# Patient Record
Sex: Male | Born: 1935 | Race: Black or African American | Hispanic: No | Marital: Single | Smoking: Former smoker
Health system: Southern US, Community
[De-identification: ages and names within clinical notes are randomized; demographics above are authoritative.]

## PROBLEM LIST (undated history)

## (undated) ENCOUNTER — Emergency Department (HOSPITAL_COMMUNITY): Disposition: A | Discharge status: Home / Self Care | Payer: Medicare Other

## (undated) DIAGNOSIS — D649 Anemia, unspecified: Secondary | ICD-10-CM

## (undated) DIAGNOSIS — I10 Essential (primary) hypertension: Secondary | ICD-10-CM

## (undated) DIAGNOSIS — F329 Major depressive disorder, single episode, unspecified: Secondary | ICD-10-CM

## (undated) DIAGNOSIS — N400 Enlarged prostate without lower urinary tract symptoms: Secondary | ICD-10-CM

## (undated) DIAGNOSIS — K3184 Gastroparesis: Secondary | ICD-10-CM

## (undated) DIAGNOSIS — F32A Depression, unspecified: Secondary | ICD-10-CM

## (undated) DIAGNOSIS — M1990 Unspecified osteoarthritis, unspecified site: Secondary | ICD-10-CM

## (undated) DIAGNOSIS — M795 Residual foreign body in soft tissue: Secondary | ICD-10-CM

## (undated) DIAGNOSIS — I739 Peripheral vascular disease, unspecified: Secondary | ICD-10-CM

## (undated) DIAGNOSIS — J449 Chronic obstructive pulmonary disease, unspecified: Secondary | ICD-10-CM

## (undated) DIAGNOSIS — F0390 Unspecified dementia without behavioral disturbance: Secondary | ICD-10-CM

## (undated) DIAGNOSIS — I509 Heart failure, unspecified: Secondary | ICD-10-CM

## (undated) DIAGNOSIS — E1140 Type 2 diabetes mellitus with diabetic neuropathy, unspecified: Secondary | ICD-10-CM

## (undated) DIAGNOSIS — E1143 Type 2 diabetes mellitus with diabetic autonomic (poly)neuropathy: Secondary | ICD-10-CM

## (undated) DIAGNOSIS — K219 Gastro-esophageal reflux disease without esophagitis: Secondary | ICD-10-CM

## (undated) DIAGNOSIS — N189 Chronic kidney disease, unspecified: Secondary | ICD-10-CM

## (undated) DIAGNOSIS — E119 Type 2 diabetes mellitus without complications: Secondary | ICD-10-CM

## (undated) DIAGNOSIS — C801 Malignant (primary) neoplasm, unspecified: Secondary | ICD-10-CM

## (undated) DIAGNOSIS — G709 Myoneural disorder, unspecified: Secondary | ICD-10-CM

## (undated) HISTORY — DX: Chronic kidney disease, unspecified: N18.9

## (undated) HISTORY — DX: Essential (primary) hypertension: I10

## (undated) HISTORY — DX: Unspecified dementia, unspecified severity, without behavioral disturbance, psychotic disturbance, mood disturbance, and anxiety: F03.90

## (undated) HISTORY — DX: Type 2 diabetes mellitus without complications: E11.9

## (undated) HISTORY — DX: Residual foreign body in soft tissue: M79.5

## (undated) HISTORY — DX: Type 2 diabetes mellitus with diabetic neuropathy, unspecified: E11.40

## (undated) HISTORY — DX: Malignant (primary) neoplasm, unspecified: C80.1

## (undated) HISTORY — DX: Gastroparesis: K31.84

## (undated) HISTORY — DX: Peripheral vascular disease, unspecified: I73.9

## (undated) HISTORY — PX: SPINE SURGERY: SHX786

## (undated) HISTORY — DX: Benign prostatic hyperplasia without lower urinary tract symptoms: N40.0

## (undated) HISTORY — DX: Chronic obstructive pulmonary disease, unspecified: J44.9

## (undated) HISTORY — DX: Type 2 diabetes mellitus with diabetic autonomic (poly)neuropathy: E11.43

## (undated) HISTORY — PX: OTHER SURGICAL HISTORY: SHX169

## (undated) HISTORY — DX: Anemia, unspecified: D64.9

---

## 2010-04-10 ENCOUNTER — Ambulatory Visit: Payer: Self-pay | Admitting: Internal Medicine

## 2010-04-24 ENCOUNTER — Ambulatory Visit: Payer: Self-pay | Admitting: Internal Medicine

## 2010-05-08 ENCOUNTER — Ambulatory Visit: Payer: Self-pay | Admitting: Internal Medicine

## 2010-05-22 ENCOUNTER — Ambulatory Visit: Payer: Self-pay | Admitting: Internal Medicine

## 2010-05-31 ENCOUNTER — Ambulatory Visit: Payer: Self-pay | Admitting: Internal Medicine

## 2010-06-21 ENCOUNTER — Ambulatory Visit: Payer: Self-pay | Admitting: Internal Medicine

## 2010-06-29 ENCOUNTER — Ambulatory Visit: Payer: Self-pay | Admitting: Internal Medicine

## 2011-05-01 ENCOUNTER — Ambulatory Visit: Payer: Self-pay | Admitting: Emergency Medicine

## 2012-02-13 ENCOUNTER — Ambulatory Visit: Payer: Self-pay | Admitting: Emergency Medicine

## 2012-02-13 LAB — HEPATIC FUNCTION PANEL A (ARMC)
Albumin: 3.6 g/dL (ref 3.4–5.0)
Alkaline Phosphatase: 79 U/L (ref 50–136)
Bilirubin, Direct: 0.1 mg/dL (ref 0.00–0.20)
SGOT(AST): 45 U/L — ABNORMAL HIGH (ref 15–37)
SGPT (ALT): 38 U/L

## 2012-02-20 ENCOUNTER — Inpatient Hospital Stay: Payer: Self-pay | Admitting: Emergency Medicine

## 2012-02-21 LAB — PATHOLOGY REPORT

## 2013-02-27 ENCOUNTER — Encounter (HOSPITAL_BASED_OUTPATIENT_CLINIC_OR_DEPARTMENT_OTHER): Payer: Medicare Other | Attending: General Surgery

## 2013-02-27 DIAGNOSIS — J4489 Other specified chronic obstructive pulmonary disease: Secondary | ICD-10-CM | POA: Insufficient documentation

## 2013-02-27 DIAGNOSIS — L97509 Non-pressure chronic ulcer of other part of unspecified foot with unspecified severity: Secondary | ICD-10-CM | POA: Insufficient documentation

## 2013-02-27 DIAGNOSIS — L89109 Pressure ulcer of unspecified part of back, unspecified stage: Secondary | ICD-10-CM | POA: Insufficient documentation

## 2013-02-27 DIAGNOSIS — E1169 Type 2 diabetes mellitus with other specified complication: Secondary | ICD-10-CM | POA: Insufficient documentation

## 2013-02-27 DIAGNOSIS — J449 Chronic obstructive pulmonary disease, unspecified: Secondary | ICD-10-CM | POA: Insufficient documentation

## 2013-02-27 DIAGNOSIS — F209 Schizophrenia, unspecified: Secondary | ICD-10-CM | POA: Insufficient documentation

## 2013-02-27 DIAGNOSIS — L8992 Pressure ulcer of unspecified site, stage 2: Secondary | ICD-10-CM | POA: Insufficient documentation

## 2013-02-27 NOTE — Progress Notes (Signed)
[facility]  NAME:  Alan Mcintyre, Alan Mcintyre NO.:  [id number]  MEDICAL RECORD NO.:  [id number]      DATE OF BIRTH:  Dec 01, 1934  PHYSICIAN:  Judene Companion, M.D.           VISIT DATE:                                  OFFICE VISIT   This is a 78 year old man, who appears much older and has had many medical problems, including cardiomegaly, schizophrenia, COPD, diabetes, and most impressively according to his doctor, he has had a real breakdown with his schizophrenia and depression.  He was in the hospital for his diabetes and somehow while he was there, he developed a pressure ulcer on his back.  It is about 2.5 cm in diameter.  He also has a diabetic ulcer on the anterior aspect of his right leg.  It is about a cm in diameter.  Both of these have dead skin on the surface and I had to debride this off with sharp dissection, and we are going to treat these with Santyl and dressings and have him come back in a week and while he was here, we did culture these to see if he is going to need an antibiotic.  There is not any evidence of cellulitis and I do feel pulses on both of his legs.  When he was here, his blood pressure is 123/74, respirations 16, pulse 80, temperature 97.7.  He weighs 172 pounds and he is 5 feet 10 inches, so he will be treated with Santyl and return in a week.  DIAGNOSES:  Diabetic ulcer, anterior aspect, right leg, Wagner 3, and then on his back he has stage II pressure ulcer.  Other diagnoses are diabetes, chronic obstructive pulmonary disease, hypertension, schizophrenia, depression.     Judene Companion, M.D.     PP/MEDQ  D:  02/27/2013  T:  02/27/2013  Job:  [id number]

## 2013-03-20 ENCOUNTER — Encounter (HOSPITAL_BASED_OUTPATIENT_CLINIC_OR_DEPARTMENT_OTHER): Payer: Medicare Other | Attending: General Surgery

## 2013-03-20 DIAGNOSIS — L89109 Pressure ulcer of unspecified part of back, unspecified stage: Secondary | ICD-10-CM | POA: Insufficient documentation

## 2013-03-20 DIAGNOSIS — E1169 Type 2 diabetes mellitus with other specified complication: Secondary | ICD-10-CM | POA: Insufficient documentation

## 2013-03-20 DIAGNOSIS — L97809 Non-pressure chronic ulcer of other part of unspecified lower leg with unspecified severity: Secondary | ICD-10-CM | POA: Insufficient documentation

## 2013-03-20 DIAGNOSIS — L8992 Pressure ulcer of unspecified site, stage 2: Secondary | ICD-10-CM | POA: Insufficient documentation

## 2013-04-17 ENCOUNTER — Encounter (HOSPITAL_BASED_OUTPATIENT_CLINIC_OR_DEPARTMENT_OTHER): Payer: Medicare Other | Attending: General Surgery

## 2013-04-17 DIAGNOSIS — E1169 Type 2 diabetes mellitus with other specified complication: Secondary | ICD-10-CM | POA: Insufficient documentation

## 2013-04-17 DIAGNOSIS — L97809 Non-pressure chronic ulcer of other part of unspecified lower leg with unspecified severity: Secondary | ICD-10-CM | POA: Insufficient documentation

## 2013-04-17 DIAGNOSIS — L89309 Pressure ulcer of unspecified buttock, unspecified stage: Secondary | ICD-10-CM | POA: Insufficient documentation

## 2013-04-17 DIAGNOSIS — L8992 Pressure ulcer of unspecified site, stage 2: Secondary | ICD-10-CM | POA: Insufficient documentation

## 2013-04-21 ENCOUNTER — Inpatient Hospital Stay: Payer: Self-pay | Admitting: Surgery

## 2013-04-21 DIAGNOSIS — I369 Nonrheumatic tricuspid valve disorder, unspecified: Secondary | ICD-10-CM

## 2013-04-21 LAB — BASIC METABOLIC PANEL
Anion Gap: 11 (ref 7–16)
BUN: 64 mg/dL — ABNORMAL HIGH (ref 7–18)
Chloride: 111 mmol/L — ABNORMAL HIGH (ref 98–107)
Creatinine: 4.06 mg/dL — ABNORMAL HIGH (ref 0.60–1.30)
EGFR (African American): 16 — ABNORMAL LOW
Glucose: 190 mg/dL — ABNORMAL HIGH (ref 65–99)
Potassium: 4.9 mmol/L (ref 3.5–5.1)

## 2013-04-21 LAB — CBC WITH DIFFERENTIAL/PLATELET
Eosinophil #: 0 10*3/uL (ref 0.0–0.7)
Eosinophil %: 0.2 %
HCT: 37 % — ABNORMAL LOW (ref 40.0–52.0)
HGB: 12.6 g/dL — ABNORMAL LOW (ref 13.0–18.0)
Lymphocyte %: 14.8 %
MCH: 30.1 pg (ref 26.0–34.0)
MCV: 89 fL (ref 80–100)
Monocyte #: 0.8 x10 3/mm (ref 0.2–1.0)
Monocyte %: 19.2 %
Neutrophil #: 2.8 10*3/uL (ref 1.4–6.5)
Platelet: 329 10*3/uL (ref 150–440)
WBC: 4.2 10*3/uL (ref 3.8–10.6)

## 2013-04-21 LAB — COMPREHENSIVE METABOLIC PANEL
Albumin: 3.6 g/dL (ref 3.4–5.0)
Calcium, Total: 8.9 mg/dL (ref 8.5–10.1)
Chloride: 106 mmol/L (ref 98–107)
Co2: 22 mmol/L (ref 21–32)
EGFR (African American): 15 — ABNORMAL LOW
EGFR (Non-African Amer.): 13 — ABNORMAL LOW
Glucose: 268 mg/dL — ABNORMAL HIGH (ref 65–99)
Osmolality: 299 (ref 275–301)
Potassium: 4.4 mmol/L (ref 3.5–5.1)
Sodium: 138 mmol/L (ref 136–145)

## 2013-04-21 LAB — URINALYSIS, COMPLETE
Blood: NEGATIVE
Glucose,UR: NEGATIVE mg/dL (ref 0–75)
Hyaline Cast: 8
Ketone: NEGATIVE
Leukocyte Esterase: NEGATIVE
Ph: 5 (ref 4.5–8.0)
Protein: 30
Specific Gravity: 1.019 (ref 1.003–1.030)
Squamous Epithelial: NONE SEEN
WBC UR: 4 /HPF (ref 0–5)

## 2013-04-21 LAB — MAGNESIUM: Magnesium: 1.5 mg/dL — ABNORMAL LOW

## 2013-04-21 LAB — LIPASE, BLOOD: Lipase: 195 U/L (ref 73–393)

## 2013-04-21 LAB — PHOSPHORUS: Phosphorus: 5 mg/dL — ABNORMAL HIGH (ref 2.5–4.9)

## 2013-04-21 LAB — PROTIME-INR: INR: 1.2

## 2013-04-21 LAB — SODIUM, URINE, RANDOM: Sodium, Urine Random: 6 mmol/L (ref 20–110)

## 2013-04-22 LAB — CBC WITH DIFFERENTIAL/PLATELET
Basophil %: 0.2 %
Eosinophil #: 0 10*3/uL (ref 0.0–0.7)
Eosinophil %: 0.7 %
HCT: 34 % — ABNORMAL LOW (ref 40.0–52.0)
Lymphocyte #: 0.5 10*3/uL — ABNORMAL LOW (ref 1.0–3.6)
MCH: 30.1 pg (ref 26.0–34.0)
MCHC: 34.3 g/dL (ref 32.0–36.0)
MCV: 88 fL (ref 80–100)
Monocyte #: 0.8 x10 3/mm (ref 0.2–1.0)
Neutrophil %: 62.7 %
Platelet: 257 10*3/uL (ref 150–440)
RBC: 3.88 10*6/uL — ABNORMAL LOW (ref 4.40–5.90)
RDW: 17.4 % — ABNORMAL HIGH (ref 11.5–14.5)
WBC: 3.6 10*3/uL — ABNORMAL LOW (ref 3.8–10.6)

## 2013-04-22 LAB — BASIC METABOLIC PANEL
Anion Gap: 10 (ref 7–16)
Chloride: 110 mmol/L — ABNORMAL HIGH (ref 98–107)
Co2: 22 mmol/L (ref 21–32)
EGFR (Non-African Amer.): 15 — ABNORMAL LOW
Osmolality: 311 (ref 275–301)
Potassium: 3.7 mmol/L (ref 3.5–5.1)
Sodium: 142 mmol/L (ref 136–145)

## 2013-04-22 LAB — MAGNESIUM: Magnesium: 2.3 mg/dL

## 2013-04-23 LAB — URINE CULTURE

## 2013-04-25 LAB — CBC WITH DIFFERENTIAL/PLATELET
Basophil %: 0.1 %
Eosinophil #: 0 10*3/uL (ref 0.0–0.7)
Eosinophil %: 0.1 %
HGB: 12.5 g/dL — ABNORMAL LOW (ref 13.0–18.0)
Lymphocyte #: 0.6 10*3/uL — ABNORMAL LOW (ref 1.0–3.6)
MCH: 30.2 pg (ref 26.0–34.0)
MCHC: 34.4 g/dL (ref 32.0–36.0)
Monocyte #: 0.7 x10 3/mm (ref 0.2–1.0)
Monocyte %: 10.5 %
Neutrophil #: 5.1 10*3/uL (ref 1.4–6.5)
Neutrophil %: 79.7 %
Platelet: 253 10*3/uL (ref 150–440)
RBC: 4.12 10*6/uL — ABNORMAL LOW (ref 4.40–5.90)
RDW: 16.2 % — ABNORMAL HIGH (ref 11.5–14.5)
WBC: 6.4 10*3/uL (ref 3.8–10.6)

## 2013-04-25 LAB — BASIC METABOLIC PANEL
Anion Gap: 7 (ref 7–16)
BUN: 32 mg/dL — ABNORMAL HIGH (ref 7–18)
Calcium, Total: 8.4 mg/dL — ABNORMAL LOW (ref 8.5–10.1)
Chloride: 113 mmol/L — ABNORMAL HIGH (ref 98–107)
Co2: 23 mmol/L (ref 21–32)
Creatinine: 1.87 mg/dL — ABNORMAL HIGH (ref 0.60–1.30)
EGFR (African American): 40 — ABNORMAL LOW
Glucose: 161 mg/dL — ABNORMAL HIGH (ref 65–99)
Osmolality: 295 (ref 275–301)
Potassium: 4.4 mmol/L (ref 3.5–5.1)
Sodium: 143 mmol/L (ref 136–145)

## 2013-04-26 LAB — CBC WITH DIFFERENTIAL/PLATELET
Basophil #: 0 10*3/uL (ref 0.0–0.1)
Basophil %: 0.3 %
Eosinophil #: 0 10*3/uL (ref 0.0–0.7)
HGB: 11.5 g/dL — ABNORMAL LOW (ref 13.0–18.0)
Lymphocyte #: 0.6 10*3/uL — ABNORMAL LOW (ref 1.0–3.6)
Lymphocyte %: 7.3 %
MCH: 29.9 pg (ref 26.0–34.0)
MCHC: 34.7 g/dL (ref 32.0–36.0)
MCV: 86 fL (ref 80–100)
Monocyte #: 1 x10 3/mm (ref 0.2–1.0)
Monocyte %: 12.3 %
Neutrophil %: 79.9 %
RDW: 16.4 % — ABNORMAL HIGH (ref 11.5–14.5)
WBC: 8.2 10*3/uL (ref 3.8–10.6)

## 2013-04-26 LAB — BASIC METABOLIC PANEL
BUN: 30 mg/dL — ABNORMAL HIGH (ref 7–18)
Calcium, Total: 9 mg/dL (ref 8.5–10.1)
Chloride: 111 mmol/L — ABNORMAL HIGH (ref 98–107)
Co2: 24 mmol/L (ref 21–32)
EGFR (African American): 45 — ABNORMAL LOW
Glucose: 186 mg/dL — ABNORMAL HIGH (ref 65–99)
Osmolality: 294 (ref 275–301)
Potassium: 4.2 mmol/L (ref 3.5–5.1)

## 2013-04-26 LAB — CULTURE, BLOOD (SINGLE)

## 2013-04-26 LAB — MAGNESIUM: Magnesium: 1.3 mg/dL — ABNORMAL LOW

## 2013-04-27 LAB — CBC WITH DIFFERENTIAL/PLATELET
Basophil #: 0 10*3/uL (ref 0.0–0.1)
Eosinophil #: 0 10*3/uL (ref 0.0–0.7)
HGB: 10 g/dL — ABNORMAL LOW (ref 13.0–18.0)
Lymphocyte #: 0.6 10*3/uL — ABNORMAL LOW (ref 1.0–3.6)
MCHC: 34.9 g/dL (ref 32.0–36.0)
Monocyte #: 0.9 x10 3/mm (ref 0.2–1.0)
Monocyte %: 8.8 %
Neutrophil #: 8.4 10*3/uL — ABNORMAL HIGH (ref 1.4–6.5)
Platelet: 227 10*3/uL (ref 150–440)
RBC: 3.37 10*6/uL — ABNORMAL LOW (ref 4.40–5.90)
RDW: 16.3 % — ABNORMAL HIGH (ref 11.5–14.5)

## 2013-04-27 LAB — BASIC METABOLIC PANEL
Anion Gap: 6 — ABNORMAL LOW (ref 7–16)
BUN: 30 mg/dL — ABNORMAL HIGH (ref 7–18)
Calcium, Total: 9.2 mg/dL (ref 8.5–10.1)
Chloride: 112 mmol/L — ABNORMAL HIGH (ref 98–107)
Co2: 29 mmol/L (ref 21–32)
Creatinine: 1.58 mg/dL — ABNORMAL HIGH (ref 0.60–1.30)
EGFR (Non-African Amer.): 42 — ABNORMAL LOW
Glucose: 171 mg/dL — ABNORMAL HIGH (ref 65–99)
Osmolality: 303 (ref 275–301)
Sodium: 147 mmol/L — ABNORMAL HIGH (ref 136–145)

## 2013-04-27 LAB — URINALYSIS, COMPLETE
Bacteria: NONE SEEN
Bilirubin,UR: NEGATIVE
Blood: NEGATIVE
Ketone: NEGATIVE
Protein: NEGATIVE
RBC,UR: 4 /HPF (ref 0–5)
Specific Gravity: 1.012 (ref 1.003–1.030)

## 2013-04-28 LAB — BASIC METABOLIC PANEL
Anion Gap: 6 — ABNORMAL LOW (ref 7–16)
BUN: 26 mg/dL — ABNORMAL HIGH (ref 7–18)
Calcium, Total: 9 mg/dL (ref 8.5–10.1)
Chloride: 110 mmol/L — ABNORMAL HIGH (ref 98–107)
Co2: 30 mmol/L (ref 21–32)
Creatinine: 1.44 mg/dL — ABNORMAL HIGH (ref 0.60–1.30)
EGFR (African American): 54 — ABNORMAL LOW
EGFR (Non-African Amer.): 47 — ABNORMAL LOW
Glucose: 150 mg/dL — ABNORMAL HIGH (ref 65–99)
Osmolality: 298 (ref 275–301)
Potassium: 3.9 mmol/L (ref 3.5–5.1)
Sodium: 146 mmol/L — ABNORMAL HIGH (ref 136–145)

## 2013-04-28 LAB — PATHOLOGY REPORT

## 2013-04-29 LAB — CBC WITH DIFFERENTIAL/PLATELET
Basophil #: 0 10*3/uL (ref 0.0–0.1)
Basophil %: 0.1 %
Eosinophil #: 0.1 10*3/uL (ref 0.0–0.7)
HCT: 27.7 % — ABNORMAL LOW (ref 40.0–52.0)
Lymphocyte #: 0.5 10*3/uL — ABNORMAL LOW (ref 1.0–3.6)
Lymphocyte %: 4.2 %
MCH: 29.3 pg (ref 26.0–34.0)
MCHC: 33.8 g/dL (ref 32.0–36.0)
MCV: 87 fL (ref 80–100)
Monocyte #: 0.8 x10 3/mm (ref 0.2–1.0)
Monocyte %: 6.4 %
Neutrophil #: 11.1 10*3/uL — ABNORMAL HIGH (ref 1.4–6.5)
Neutrophil %: 88.7 %
Platelet: 220 10*3/uL (ref 150–440)
RDW: 16.7 % — ABNORMAL HIGH (ref 11.5–14.5)
WBC: 12.5 10*3/uL — ABNORMAL HIGH (ref 3.8–10.6)

## 2013-04-29 LAB — BASIC METABOLIC PANEL
Anion Gap: 3 — ABNORMAL LOW (ref 7–16)
BUN: 22 mg/dL — ABNORMAL HIGH (ref 7–18)
Chloride: 101 mmol/L (ref 98–107)
Co2: 31 mmol/L (ref 21–32)
Creatinine: 1.35 mg/dL — ABNORMAL HIGH (ref 0.60–1.30)
EGFR (African American): 59 — ABNORMAL LOW
EGFR (Non-African Amer.): 51 — ABNORMAL LOW
Osmolality: 293 (ref 275–301)

## 2013-04-29 LAB — PHOSPHORUS: Phosphorus: 2.7 mg/dL (ref 2.5–4.9)

## 2013-04-29 LAB — MAGNESIUM: Magnesium: 1.1 mg/dL — ABNORMAL LOW

## 2013-04-30 LAB — CBC WITH DIFFERENTIAL/PLATELET
Eosinophil #: 0.1 10*3/uL (ref 0.0–0.7)
Eosinophil %: 0.8 %
Lymphocyte #: 1.2 10*3/uL (ref 1.0–3.6)
MCV: 84 fL (ref 80–100)
Monocyte #: 1.4 x10 3/mm — ABNORMAL HIGH (ref 0.2–1.0)
Neutrophil #: 13.5 10*3/uL — ABNORMAL HIGH (ref 1.4–6.5)
Neutrophil %: 83.2 %
Platelet: 235 10*3/uL (ref 150–440)
RBC: 3.43 10*6/uL — ABNORMAL LOW (ref 4.40–5.90)
RDW: 16.4 % — ABNORMAL HIGH (ref 11.5–14.5)

## 2013-04-30 LAB — HEMOGLOBIN A1C: Hemoglobin A1C: 5 % (ref 4.2–6.3)

## 2013-05-01 LAB — CREATININE, SERUM
Creatinine: 1.29 mg/dL (ref 0.60–1.30)
EGFR (African American): >60

## 2013-05-01 LAB — SODIUM: Sodium: 144 mmol/L (ref 136–145)

## 2013-05-01 LAB — POTASSIUM: Potassium: 3.6 mmol/L (ref 3.5–5.1)

## 2013-05-01 LAB — VANCOMYCIN, TROUGH: Vancomycin, Trough: 10 ug/mL (ref 10–20)

## 2013-05-01 LAB — CALCIUM: Calcium, Total: 8.5 mg/dL (ref 8.5–10.1)

## 2013-05-01 LAB — MAGNESIUM: Magnesium: 1.5 mg/dL — ABNORMAL LOW

## 2013-05-02 LAB — PHOSPHORUS: Phosphorus: 2.9 mg/dL (ref 2.5–4.9)

## 2013-05-02 LAB — BASIC METABOLIC PANEL
Anion Gap: 3 — ABNORMAL LOW (ref 7–16)
BUN: 18 mg/dL (ref 7–18)
Calcium, Total: 8 mg/dL — ABNORMAL LOW (ref 8.5–10.1)
Co2: 28 mmol/L (ref 21–32)
Creatinine: 1.23 mg/dL (ref 0.60–1.30)
EGFR (Non-African Amer.): 57 — ABNORMAL LOW
Glucose: 143 mg/dL — ABNORMAL HIGH (ref 65–99)
Osmolality: 286 (ref 275–301)
Potassium: 3.7 mmol/L (ref 3.5–5.1)
Sodium: 141 mmol/L (ref 136–145)

## 2013-05-02 LAB — CBC WITH DIFFERENTIAL/PLATELET
Basophil %: 0.5 %
Eosinophil %: 1.3 %
HCT: 27.3 % — ABNORMAL LOW (ref 40.0–52.0)
Lymphocyte #: 1.4 10*3/uL (ref 1.0–3.6)
Lymphocyte %: 8.8 %
MCHC: 35.3 g/dL (ref 32.0–36.0)
MCV: 82 fL (ref 80–100)
Monocyte #: 1.5 x10 3/mm — ABNORMAL HIGH (ref 0.2–1.0)
Neutrophil %: 79.6 %
Platelet: 272 10*3/uL (ref 150–440)
RDW: 17.2 % — ABNORMAL HIGH (ref 11.5–14.5)
WBC: 15.4 10*3/uL — ABNORMAL HIGH (ref 3.8–10.6)

## 2013-05-02 LAB — MAGNESIUM: Magnesium: 1.5 mg/dL — ABNORMAL LOW

## 2013-05-03 LAB — WOUND CULTURE

## 2013-05-04 LAB — CBC WITH DIFFERENTIAL/PLATELET
Eosinophil #: 0 10*3/uL (ref 0.0–0.7)
Eosinophil %: 0.1 %
HGB: 9.4 g/dL — ABNORMAL LOW (ref 13.0–18.0)
Lymphocyte #: 1.5 10*3/uL (ref 1.0–3.6)
Lymphocyte %: 5.7 %
MCH: 28.6 pg (ref 26.0–34.0)
MCHC: 34.8 g/dL (ref 32.0–36.0)
Monocyte #: 2.3 x10 3/mm — ABNORMAL HIGH (ref 0.2–1.0)
Neutrophil #: 23 10*3/uL — ABNORMAL HIGH (ref 1.4–6.5)
Neutrophil %: 85.4 %
RDW: 17.5 % — ABNORMAL HIGH (ref 11.5–14.5)
WBC: 27 10*3/uL — ABNORMAL HIGH (ref 3.8–10.6)

## 2013-05-04 LAB — BASIC METABOLIC PANEL
BUN: 45 mg/dL — ABNORMAL HIGH (ref 7–18)
Calcium, Total: 8.6 mg/dL (ref 8.5–10.1)
Co2: 25 mmol/L (ref 21–32)
Glucose: 143 mg/dL — ABNORMAL HIGH (ref 65–99)
Osmolality: 286 (ref 275–301)
Potassium: 4.1 mmol/L (ref 3.5–5.1)
Sodium: 136 mmol/L (ref 136–145)

## 2013-05-05 LAB — BASIC METABOLIC PANEL
Anion Gap: 6 — ABNORMAL LOW (ref 7–16)
Calcium, Total: 8 mg/dL — ABNORMAL LOW (ref 8.5–10.1)
Creatinine: 2.27 mg/dL — ABNORMAL HIGH (ref 0.60–1.30)
Osmolality: 287 (ref 275–301)
Sodium: 140 mmol/L (ref 136–145)

## 2013-05-06 ENCOUNTER — Ambulatory Visit (HOSPITAL_COMMUNITY)
Admission: AD | Admit: 2013-05-06 | Discharge: 2013-05-06 | Disposition: A | Discharge status: Home / Self Care | Payer: Medicare Other | Source: Other Acute Inpatient Hospital | Attending: Internal Medicine | Admitting: Internal Medicine

## 2013-05-06 ENCOUNTER — Inpatient Hospital Stay
Admission: AD | Admit: 2013-05-06 | Discharge: 2013-05-29 | Disposition: A | Discharge status: Skilled Nursing Facility | Payer: Self-pay | Source: Ambulatory Visit | Attending: Internal Medicine | Admitting: Internal Medicine

## 2013-05-06 DIAGNOSIS — S31109A Unspecified open wound of abdominal wall, unspecified quadrant without penetration into peritoneal cavity, initial encounter: Secondary | ICD-10-CM | POA: Insufficient documentation

## 2013-05-06 DIAGNOSIS — X58XXXA Exposure to other specified factors, initial encounter: Secondary | ICD-10-CM | POA: Insufficient documentation

## 2013-05-06 LAB — CREATININE, SERUM
EGFR (African American): 37 — ABNORMAL LOW
EGFR (Non-African Amer.): 32 — ABNORMAL LOW

## 2013-05-06 LAB — WBC: WBC: 20.7 10*3/uL — ABNORMAL HIGH (ref 3.8–10.6)

## 2013-05-07 ENCOUNTER — Other Ambulatory Visit (HOSPITAL_COMMUNITY): Payer: Self-pay

## 2013-05-07 LAB — URINALYSIS, ROUTINE W REFLEX MICROSCOPIC
Bilirubin Urine: NEGATIVE
Glucose, UA: NEGATIVE mg/dL
Ketones, ur: NEGATIVE mg/dL
Nitrite: NEGATIVE
Protein, ur: NEGATIVE mg/dL
Urobilinogen, UA: 0.2 mg/dL (ref 0.0–1.0)
pH: 5 (ref 5.0–8.0)

## 2013-05-07 LAB — CBC WITH DIFFERENTIAL/PLATELET
Basophils Absolute: 0 10*3/uL (ref 0.0–0.1)
Basophils Relative: 0 % (ref 0–1)
Eosinophils Relative: 1 % (ref 0–5)
HCT: 23.1 % — ABNORMAL LOW (ref 39.0–52.0)
Hemoglobin: 7.4 g/dL — ABNORMAL LOW (ref 13.0–17.0)
Lymphs Abs: 1.2 10*3/uL (ref 0.7–4.0)
MCH: 26.4 pg (ref 26.0–34.0)
MCV: 82.5 fL (ref 78.0–100.0)
Monocytes Absolute: 1.4 10*3/uL — ABNORMAL HIGH (ref 0.1–1.0)
Monocytes Relative: 8 % (ref 3–12)
Neutro Abs: 14.1 10*3/uL — ABNORMAL HIGH (ref 1.7–7.7)
Platelets: 539 10*3/uL — ABNORMAL HIGH (ref 150–400)
RBC: 2.8 MIL/uL — ABNORMAL LOW (ref 4.22–5.81)
WBC: 16.9 10*3/uL — ABNORMAL HIGH (ref 4.0–10.5)

## 2013-05-07 LAB — URINE MICROSCOPIC-ADD ON

## 2013-05-07 LAB — PREALBUMIN: Prealbumin: 4.4 mg/dL — ABNORMAL LOW (ref 17.0–34.0)

## 2013-05-07 LAB — MAGNESIUM: Magnesium: 2 mg/dL (ref 1.5–2.5)

## 2013-05-07 LAB — COMPREHENSIVE METABOLIC PANEL
Albumin: 1.3 g/dL — ABNORMAL LOW (ref 3.5–5.2)
Alkaline Phosphatase: 79 U/L (ref 39–117)
BUN: 36 mg/dL — ABNORMAL HIGH (ref 6–23)
CO2: 22 mEq/L (ref 19–32)
Chloride: 105 mEq/L (ref 96–112)
Creatinine, Ser: 1.6 mg/dL — ABNORMAL HIGH (ref 0.50–1.35)
GFR calc Af Amer: 47 mL/min — ABNORMAL LOW (ref >90)
GFR calc non Af Amer: 40 mL/min — ABNORMAL LOW (ref >90)
Glucose, Bld: 124 mg/dL — ABNORMAL HIGH (ref 70–99)
Potassium: 4.4 mEq/L (ref 3.5–5.1)
Total Bilirubin: 0.4 mg/dL (ref 0.3–1.2)
Total Protein: 4.8 g/dL — ABNORMAL LOW (ref 6.0–8.3)

## 2013-05-07 LAB — TSH: TSH: 2.316 u[IU]/mL (ref 0.350–4.500)

## 2013-05-07 LAB — T3: T3, Total: 87.1 ng/dl (ref 80.0–204.0)

## 2013-05-08 LAB — BASIC METABOLIC PANEL
BUN: 29 mg/dL — ABNORMAL HIGH (ref 6–23)
CO2: 26 mEq/L (ref 19–32)
Calcium: 7.9 mg/dL — ABNORMAL LOW (ref 8.4–10.5)
GFR calc Af Amer: 49 mL/min — ABNORMAL LOW (ref >90)
GFR calc non Af Amer: 42 mL/min — ABNORMAL LOW (ref >90)
Glucose, Bld: 207 mg/dL — ABNORMAL HIGH (ref 70–99)
Sodium: 138 mEq/L (ref 135–145)

## 2013-05-08 LAB — CBC
HCT: 23.3 % — ABNORMAL LOW (ref 39.0–52.0)
Hemoglobin: 7.6 g/dL — ABNORMAL LOW (ref 13.0–17.0)
MCH: 26.8 pg (ref 26.0–34.0)
MCHC: 32.6 g/dL (ref 30.0–36.0)
Platelets: 567 10*3/uL — ABNORMAL HIGH (ref 150–400)
RBC: 2.84 MIL/uL — ABNORMAL LOW (ref 4.22–5.81)
WBC: 14.9 10*3/uL — ABNORMAL HIGH (ref 4.0–10.5)

## 2013-05-08 LAB — URINE CULTURE: Colony Count: NO GROWTH

## 2013-05-08 LAB — FOLATE RBC: RBC Folate: 1528 ng/mL — ABNORMAL HIGH (ref >=366)

## 2013-05-08 LAB — HEMOGLOBIN A1C
Hgb A1c MFr Bld: 6 % — ABNORMAL HIGH (ref <5.7)
Mean Plasma Glucose: 126 mg/dL — ABNORMAL HIGH (ref <117)

## 2013-05-09 ENCOUNTER — Other Ambulatory Visit (HOSPITAL_COMMUNITY): Payer: Self-pay

## 2013-05-11 LAB — PREPARE RBC (CROSSMATCH)

## 2013-05-11 LAB — CBC WITH DIFFERENTIAL/PLATELET
Basophils Absolute: 0 10*3/uL (ref 0.0–0.1)
Eosinophils Absolute: 0.2 10*3/uL (ref 0.0–0.7)
MCH: 26.1 pg (ref 26.0–34.0)
MCHC: 31.7 g/dL (ref 30.0–36.0)
MCV: 82.2 fL (ref 78.0–100.0)
Monocytes Absolute: 1 10*3/uL (ref 0.1–1.0)
Neutrophils Relative %: 75 % (ref 43–77)
Platelets: 422 10*3/uL — ABNORMAL HIGH (ref 150–400)
RBC: 2.76 MIL/uL — ABNORMAL LOW (ref 4.22–5.81)
RDW: 25.6 % — ABNORMAL HIGH (ref 11.5–15.5)

## 2013-05-11 LAB — BASIC METABOLIC PANEL
BUN: 21 mg/dL (ref 6–23)
Calcium: 8.4 mg/dL (ref 8.4–10.5)
Creatinine, Ser: 1.22 mg/dL (ref 0.50–1.35)
GFR calc Af Amer: 65 mL/min — ABNORMAL LOW (ref >90)
GFR calc non Af Amer: 56 mL/min — ABNORMAL LOW (ref >90)
Glucose, Bld: 116 mg/dL — ABNORMAL HIGH (ref 70–99)
Potassium: 4.1 mEq/L (ref 3.5–5.1)

## 2013-05-11 LAB — MAGNESIUM: Magnesium: 1.5 mg/dL (ref 1.5–2.5)

## 2013-05-12 LAB — CBC
HCT: 25.5 % — ABNORMAL LOW (ref 39.0–52.0)
MCH: 26.7 pg (ref 26.0–34.0)
Platelets: 356 10*3/uL (ref 150–400)
RBC: 3.11 MIL/uL — ABNORMAL LOW (ref 4.22–5.81)
WBC: 11.9 10*3/uL — ABNORMAL HIGH (ref 4.0–10.5)

## 2013-05-13 ENCOUNTER — Other Ambulatory Visit (HOSPITAL_COMMUNITY): Payer: Self-pay

## 2013-05-13 ENCOUNTER — Encounter: Payer: Self-pay | Admitting: Radiology

## 2013-05-13 ENCOUNTER — Other Ambulatory Visit: Payer: Self-pay | Admitting: Radiology

## 2013-05-13 LAB — BASIC METABOLIC PANEL
BUN: 21 mg/dL (ref 6–23)
Calcium: 8.6 mg/dL (ref 8.4–10.5)
Chloride: 106 mEq/L (ref 96–112)
GFR calc Af Amer: 58 mL/min — ABNORMAL LOW (ref >90)
Potassium: 3.9 mEq/L (ref 3.5–5.1)

## 2013-05-13 LAB — CBC
HCT: 28.3 % — ABNORMAL LOW (ref 39.0–52.0)
MCH: 26.5 pg (ref 26.0–34.0)
MCHC: 32.2 g/dL (ref 30.0–36.0)
RDW: 23.9 % — ABNORMAL HIGH (ref 11.5–15.5)
WBC: 14.1 10*3/uL — ABNORMAL HIGH (ref 4.0–10.5)

## 2013-05-13 MED ORDER — IOHEXOL 300 MG/ML  SOLN
80.0000 mL | Freq: Once | INTRAMUSCULAR | Status: AC | PRN
  Administered 2013-05-13: 80 mL via INTRAVENOUS

## 2013-05-14 NOTE — H&P (Signed)
Alan Mcintyre is an 78 y.o. male.   Chief Complaint: pt has hx colon ca Recent surgery at outside facility for small bowel obstruction Ileus Now with abd pain; distension CT reveals RUQ abscess Request for drain placement- Reviewed by Dr Annamaria Boots HPI: Weakness; Ileus; DM; COPD; BPH; SBO/exp lap; colon ca  History reviewed. No pertinent past medical history.  No past surgical history on file.  No family history on file. Social History:  has no tobacco, alcohol, and drug history on file.  Allergies: Not on File  No prescriptions prior to admission    Results for orders placed during the hospital encounter of 05/06/13 (from the past 48 hour(s))  CBC     Status: Abnormal   Collection Time    05/13/13  6:32 AM      Result Value Range   WBC 14.1 (*) 4.0 - 10.5 K/uL   RBC 3.43 (*) 4.22 - 5.81 MIL/uL   Hemoglobin 9.1 (*) 13.0 - 17.0 g/dL   HCT 28.3 (*) 39.0 - 52.0 %   MCV 82.5  78.0 - 100.0 fL   MCH 26.5  26.0 - 34.0 pg   MCHC 32.2  30.0 - 36.0 g/dL   RDW 23.9 (*) 11.5 - 15.5 %   Platelets 373  150 - 400 K/uL  BASIC METABOLIC PANEL     Status: Abnormal   Collection Time    05/13/13  6:32 AM      Result Value Range   Sodium 141  135 - 145 mEq/L   Potassium 3.9  3.5 - 5.1 mEq/L   Chloride 106  96 - 112 mEq/L   CO2 30  19 - 32 mEq/L   Glucose, Bld 50 (*) 70 - 99 mg/dL   BUN 21  6 - 23 mg/dL   Creatinine, Ser 1.34  0.50 - 1.35 mg/dL   Calcium 8.6  8.4 - 10.5 mg/dL   GFR calc non Af Amer 50 (*) >90 mL/min   GFR calc Af Amer 58 (*) >90 mL/min   Comment: (NOTE)     The eGFR has been calculated using the CKD EPI equation.     This calculation has not been validated in all clinical situations.     eGFR's persistently <90 mL/min signify possible Chronic Kidney     Disease.   Ct Abdomen Pelvis W Contrast  05/13/2013   CLINICAL DATA:  Abdominal pain. History of ileus. Evaluate for bowel obstruction.  EXAM: CT ABDOMEN AND PELVIS WITH CONTRAST  TECHNIQUE: Multidetector CT imaging of  the abdomen and pelvis was performed using the standard protocol following bolus administration of intravenous contrast.  CONTRAST:  14mL OMNIPAQUE IOHEXOL 300 MG/ML  SOLN  COMPARISON:  No priors.  FINDINGS: Lung Bases: Moderate right pleural effusion with associated passive atelectasis in portions of the right lower lobe. Atherosclerotic calcifications in the left anterior descending and left circumflex coronary arteries.  Abdomen/Pelvis: Status post cholecystectomy. The appearance of the liver, pancreas, spleen and bilateral adrenal glands is unremarkable. Numerous sub cm low-attenuation lesions are noted in the kidneys bilaterally, too small to definitively characterize, but statistically favored to represent small cysts. In addition, in the upper pole of the left kidney there is a 2.7 cm intermediate attenuation lesion (33-34 HU on postcontrast and postcontrast delayed images), likely to represent a proteinaceous cyst.  There is a healing midline abdominal wound with what appears to be a wound VAC in place near the umbilicus. Small volume of ascites. In the right upper quadrant of  the abdomen there is also some gas within a fluid collection which demonstrates some rim enhancement (rim enhancement best demonstrated on image 21 of series 7), suspicious for a small abscess. This is located immediately anterior to the liver, extending inferiorly where lies anterolaterally to the hepatic flexure and distal ascending colon. Other pockets of free flowing ascites are noted throughout the abdomen and pelvis, with the largest collection in the anatomic pelvis inferiorly and anteriorly. No definite pathologic distention of small bowel is appreciated at this time. Normal appendix. Foley balloon catheter present with tip inside the lumen of the urinary bladder, and there is a small amount of nondependent gas in the urinary bladder, presumably iatrogenic. Extensive atherosclerosis throughout the abdominal and pelvic  vasculature, without definite aneurysm or dissection. Numerous reactive size lymph nodes throughout the abdomen and pelvis, but no definite lymphadenopathy identified.  Musculoskeletal: Diffuse body wall edema. Multiple tiny sclerotic lesions throughout the visualized axial and appendicular skeleton are favored to represent tiny bone islands. There are no aggressive appearing lytic or blastic lesions noted in the visualized portions of the skeleton.  IMPRESSION: 1. Postoperative changes of unknown recent abdominal surgery with healing anterior abdominal wound with probable wound VAC in place, with moderate volume of ascites. At least 1 of these collections of fluid in the right upper quadrant of the abdomen appears likely loculated with enhancing rim and internal gas, concerning for an infected postoperative fluid collection (i.e., likely an early abscess), as discussed above. Clinical correlation is recommended. 2. Diffuse body wall edema, ascites and right pleural effusion, suggesting a state of anasarca. 3. Extensive atherosclerosis including left anterior descending and left circumflex coronary artery disease. 4. Additional findings, as above.   Electronically Signed   By: Vinnie Langton M.D.   On: 05/13/2013 17:21   Dg Abd Portable 1v  05/13/2013   CLINICAL DATA:  Ileus and constipation.  EXAM: PORTABLE ABDOMEN - 1 VIEW  COMPARISON:  05/09/2013  FINDINGS: Surgical staples are again noted in the right abdomen. Again noted is a focal dilated loops of small bowel in the mid abdomen that measures up to 4.5 cm and not significantly changed. Otherwise, the bowel gas pattern is nonspecific. Again noted is stool in the rectal region and some stool in the left and right colon. Degenerative changes at the lumbosacral junction.  IMPRESSION: Stable bowel gas pattern. Again noted is a dilated loop of small bowel in the mid abdomen. Findings could be associated with a focal ileus or partial obstruction.  Moderate stool  burden.   Electronically Signed   By: Markus Daft M.D.   On: 05/13/2013 08:20    Review of Systems  Constitutional: Positive for weight loss and diaphoresis. Negative for fever.  Respiratory: Positive for shortness of breath.   Cardiovascular: Negative for chest pain.  Gastrointestinal: Positive for nausea, abdominal pain and diarrhea. Negative for vomiting.  Neurological: Positive for weakness.    There were no vitals taken for this visit. Physical Exam  Constitutional: He is oriented to person, place, and time. He appears well-developed.  Cardiovascular: Normal rate, regular rhythm and normal heart sounds.   No murmur heard. Respiratory: Effort normal. He has wheezes.  GI: Soft. Bowel sounds are normal. He exhibits distension. There is tenderness.  Musculoskeletal: Normal range of motion.  Moves all 4s--WEAK  Neurological: He is alert and oriented to person, place, and time.  Oriented to name; place; time  Skin: Skin is warm.  Psychiatric: He has a normal mood and affect.  His behavior is normal. Judgment and thought content normal.     Assessment/Plan Hx colon ca Sbo/exp lap in an outside facility Now with abd pain/distension CT reveals RUQ abscess Drain placement scheduled for 10/31 Pt aware of procedure benefits and risks and agreeable to proceed Consent signed and in chart Hep inj held 10/31   Tenino A 05/14/2013, 2:45 PM

## 2013-05-15 ENCOUNTER — Other Ambulatory Visit (HOSPITAL_COMMUNITY): Payer: Self-pay

## 2013-05-15 LAB — CBC WITH DIFFERENTIAL/PLATELET
Basophils Absolute: 0.1 10*3/uL (ref 0.0–0.1)
Eosinophils Absolute: 0.3 10*3/uL (ref 0.0–0.7)
Lymphs Abs: 1.6 10*3/uL (ref 0.7–4.0)
MCHC: 31.6 g/dL (ref 30.0–36.0)
MCV: 82.9 fL (ref 78.0–100.0)
Monocytes Relative: 8 % (ref 3–12)
Neutro Abs: 7.8 10*3/uL — ABNORMAL HIGH (ref 1.7–7.7)
Neutrophils Relative %: 73 % (ref 43–77)
Platelets: 300 10*3/uL (ref 150–400)
RBC: 2.98 MIL/uL — ABNORMAL LOW (ref 4.22–5.81)
RDW: 23.6 % — ABNORMAL HIGH (ref 11.5–15.5)
WBC: 10.7 10*3/uL — ABNORMAL HIGH (ref 4.0–10.5)

## 2013-05-15 LAB — BASIC METABOLIC PANEL
BUN: 20 mg/dL (ref 6–23)
CO2: 29 mEq/L (ref 19–32)
Calcium: 8 mg/dL — ABNORMAL LOW (ref 8.4–10.5)
Creatinine, Ser: 1.33 mg/dL (ref 0.50–1.35)
GFR calc Af Amer: 58 mL/min — ABNORMAL LOW (ref >90)
GFR calc non Af Amer: 50 mL/min — ABNORMAL LOW (ref >90)
Potassium: 3.9 mEq/L (ref 3.5–5.1)

## 2013-05-15 LAB — TYPE AND SCREEN
Unit division: 0
Unit division: 0

## 2013-05-15 LAB — PROTIME-INR
INR: 1.08 (ref 0.00–1.49)
Prothrombin Time: 13.8 seconds (ref 11.6–15.2)

## 2013-05-15 MED ORDER — MIDAZOLAM HCL 2 MG/2ML IJ SOLN
INTRAMUSCULAR | Status: AC
  Filled 2013-05-15: qty 4

## 2013-05-15 MED ORDER — MIDAZOLAM HCL 2 MG/2ML IJ SOLN
INTRAMUSCULAR | Status: AC | PRN
  Administered 2013-05-15 (×2): 1 mg via INTRAVENOUS

## 2013-05-15 MED ORDER — FENTANYL CITRATE 0.05 MG/ML IJ SOLN
INTRAMUSCULAR | Status: AC
  Filled 2013-05-15: qty 4

## 2013-05-15 MED ORDER — FENTANYL CITRATE 0.05 MG/ML IJ SOLN
INTRAMUSCULAR | Status: AC | PRN
  Administered 2013-05-15 (×2): 50 ug via INTRAVENOUS

## 2013-05-15 NOTE — Procedures (Signed)
CT guided aspiration of the air-fluid collection in the right upper abdomen.  Predominately air was aspirated, one ml of purulent fluid was removed.  A wire would not advance into collection and unable to place drain.  The collection was decompressed after the aspiration.  Will send fluid for culture.

## 2013-05-16 LAB — CBC
HCT: 25.5 % — ABNORMAL LOW (ref 39.0–52.0)
Hemoglobin: 8.1 g/dL — ABNORMAL LOW (ref 13.0–17.0)
MCH: 26.4 pg (ref 26.0–34.0)
Platelets: 387 10*3/uL (ref 150–400)
RBC: 3.07 MIL/uL — ABNORMAL LOW (ref 4.22–5.81)
WBC: 11.3 10*3/uL — ABNORMAL HIGH (ref 4.0–10.5)

## 2013-05-18 ENCOUNTER — Other Ambulatory Visit (HOSPITAL_COMMUNITY): Payer: Self-pay

## 2013-05-18 LAB — COMPREHENSIVE METABOLIC PANEL WITH GFR
ALT: 16 U/L (ref 0–53)
AST: 29 U/L (ref 0–37)
Albumin: 1.5 g/dL — ABNORMAL LOW (ref 3.5–5.2)
Alkaline Phosphatase: 71 U/L (ref 39–117)
BUN: 20 mg/dL (ref 6–23)
CO2: 28 meq/L (ref 19–32)
Calcium: 8.2 mg/dL — ABNORMAL LOW (ref 8.4–10.5)
Chloride: 107 meq/L (ref 96–112)
Creatinine, Ser: 1.38 mg/dL — ABNORMAL HIGH (ref 0.50–1.35)
GFR calc Af Amer: 56 mL/min — ABNORMAL LOW
GFR calc non Af Amer: 48 mL/min — ABNORMAL LOW
Glucose, Bld: 79 mg/dL (ref 70–99)
Potassium: 4.3 meq/L (ref 3.5–5.1)
Sodium: 140 meq/L (ref 135–145)
Total Bilirubin: 0.4 mg/dL (ref 0.3–1.2)
Total Protein: 5.4 g/dL — ABNORMAL LOW (ref 6.0–8.3)

## 2013-05-18 LAB — CBC WITH DIFFERENTIAL/PLATELET
Basophils Absolute: 0.1 K/uL (ref 0.0–0.1)
Basophils Relative: 1 % (ref 0–1)
Eosinophils Absolute: 0.5 K/uL (ref 0.0–0.7)
Eosinophils Relative: 4 % (ref 0–5)
HCT: 24.2 % — ABNORMAL LOW (ref 39.0–52.0)
Hemoglobin: 7.8 g/dL — ABNORMAL LOW (ref 13.0–17.0)
Lymphocytes Relative: 19 % (ref 12–46)
Lymphs Abs: 2.2 K/uL (ref 0.7–4.0)
MCH: 26.6 pg (ref 26.0–34.0)
MCHC: 32.2 g/dL (ref 30.0–36.0)
MCV: 82.6 fL (ref 78.0–100.0)
Monocytes Absolute: 1.1 K/uL — ABNORMAL HIGH (ref 0.1–1.0)
Monocytes Relative: 9 % (ref 3–12)
Neutro Abs: 7.8 K/uL — ABNORMAL HIGH (ref 1.7–7.7)
Neutrophils Relative %: 67 % (ref 43–77)
Platelets: 415 K/uL — ABNORMAL HIGH (ref 150–400)
RBC: 2.93 MIL/uL — ABNORMAL LOW (ref 4.22–5.81)
RDW: 24.1 % — ABNORMAL HIGH (ref 11.5–15.5)
WBC: 11.7 K/uL — ABNORMAL HIGH (ref 4.0–10.5)

## 2013-05-19 LAB — CULTURE, ROUTINE-ABSCESS

## 2013-05-20 LAB — CULTURE, BLOOD (ROUTINE X 2)
Culture: NO GROWTH
Culture: NO GROWTH

## 2013-05-21 ENCOUNTER — Other Ambulatory Visit (HOSPITAL_COMMUNITY): Payer: Self-pay

## 2013-05-21 ENCOUNTER — Encounter: Payer: Self-pay | Admitting: Radiology

## 2013-05-21 LAB — CBC WITH DIFFERENTIAL/PLATELET
Basophils Absolute: 0.1 10*3/uL (ref 0.0–0.1)
Eosinophils Absolute: 0.5 10*3/uL (ref 0.0–0.7)
HCT: 26.5 % — ABNORMAL LOW (ref 39.0–52.0)
Lymphs Abs: 2.9 10*3/uL (ref 0.7–4.0)
MCHC: 31.7 g/dL (ref 30.0–36.0)
MCV: 84.9 fL (ref 78.0–100.0)
Monocytes Absolute: 1 10*3/uL (ref 0.1–1.0)
Monocytes Relative: 8 % (ref 3–12)
Neutro Abs: 7.9 10*3/uL — ABNORMAL HIGH (ref 1.7–7.7)
Platelets: 291 10*3/uL (ref 150–400)
RBC: 3.12 MIL/uL — ABNORMAL LOW (ref 4.22–5.81)
RDW: 25.8 % — ABNORMAL HIGH (ref 11.5–15.5)
WBC: 12.4 10*3/uL — ABNORMAL HIGH (ref 4.0–10.5)

## 2013-05-21 LAB — BASIC METABOLIC PANEL
BUN: 20 mg/dL (ref 6–23)
CO2: 28 mEq/L (ref 19–32)
Calcium: 8.8 mg/dL (ref 8.4–10.5)
Creatinine, Ser: 1.39 mg/dL — ABNORMAL HIGH (ref 0.50–1.35)
GFR calc Af Amer: 55 mL/min — ABNORMAL LOW (ref >90)
GFR calc non Af Amer: 48 mL/min — ABNORMAL LOW (ref >90)
Glucose, Bld: 142 mg/dL — ABNORMAL HIGH (ref 70–99)
Potassium: 4.1 mEq/L (ref 3.5–5.1)

## 2013-05-21 MED ORDER — IOHEXOL 300 MG/ML  SOLN
100.0000 mL | Freq: Once | INTRAMUSCULAR | Status: AC | PRN
  Administered 2013-05-21: 100 mL via INTRAVENOUS

## 2013-05-22 ENCOUNTER — Other Ambulatory Visit (HOSPITAL_COMMUNITY): Payer: Self-pay

## 2013-05-22 LAB — CBC
HCT: 25.1 % — ABNORMAL LOW (ref 39.0–52.0)
Hemoglobin: 7.8 g/dL — ABNORMAL LOW (ref 13.0–17.0)
MCH: 26.7 pg (ref 26.0–34.0)
MCV: 86 fL (ref 78.0–100.0)
RBC: 2.92 MIL/uL — ABNORMAL LOW (ref 4.22–5.81)
WBC: 11.6 10*3/uL — ABNORMAL HIGH (ref 4.0–10.5)

## 2013-05-22 LAB — BASIC METABOLIC PANEL
BUN: 17 mg/dL (ref 6–23)
CO2: 27 mEq/L (ref 19–32)
Calcium: 8.7 mg/dL (ref 8.4–10.5)
Chloride: 108 mEq/L (ref 96–112)
Creatinine, Ser: 1.54 mg/dL — ABNORMAL HIGH (ref 0.50–1.35)
Glucose, Bld: 125 mg/dL — ABNORMAL HIGH (ref 70–99)
Potassium: 3.9 mEq/L (ref 3.5–5.1)

## 2013-05-23 LAB — CBC
MCH: 27.6 pg (ref 26.0–34.0)
MCV: 84.4 fL (ref 78.0–100.0)
Platelets: 376 10*3/uL (ref 150–400)
RDW: 26.7 % — ABNORMAL HIGH (ref 11.5–15.5)

## 2013-05-25 LAB — BASIC METABOLIC PANEL
Calcium: 8.7 mg/dL (ref 8.4–10.5)
Creatinine, Ser: 1.51 mg/dL — ABNORMAL HIGH (ref 0.50–1.35)
GFR calc non Af Amer: 43 mL/min — ABNORMAL LOW (ref >90)
Glucose, Bld: 57 mg/dL — ABNORMAL LOW (ref 70–99)
Sodium: 140 mEq/L (ref 135–145)

## 2013-05-25 LAB — CBC
MCH: 27.6 pg (ref 26.0–34.0)
MCHC: 32 g/dL (ref 30.0–36.0)
MCV: 86.3 fL (ref 78.0–100.0)
Platelets: 274 10*3/uL (ref 150–400)
RDW: 27.8 % — ABNORMAL HIGH (ref 11.5–15.5)

## 2013-05-27 ENCOUNTER — Other Ambulatory Visit (HOSPITAL_COMMUNITY): Payer: Self-pay

## 2013-05-27 LAB — CBC
MCH: 27.8 pg (ref 26.0–34.0)
MCV: 85.6 fL (ref 78.0–100.0)
Platelets: 475 10*3/uL — ABNORMAL HIGH (ref 150–400)
RBC: 3.13 MIL/uL — ABNORMAL LOW (ref 4.22–5.81)
RDW: 28 % — ABNORMAL HIGH (ref 11.5–15.5)
WBC: 10.8 10*3/uL — ABNORMAL HIGH (ref 4.0–10.5)

## 2013-05-27 LAB — BASIC METABOLIC PANEL
CO2: 25 mEq/L (ref 19–32)
Calcium: 8.9 mg/dL (ref 8.4–10.5)
Creatinine, Ser: 1.46 mg/dL — ABNORMAL HIGH (ref 0.50–1.35)
GFR calc Af Amer: 52 mL/min — ABNORMAL LOW (ref >90)
GFR calc non Af Amer: 45 mL/min — ABNORMAL LOW (ref >90)
Potassium: 4.3 mEq/L (ref 3.5–5.1)
Sodium: 139 mEq/L (ref 135–145)

## 2013-05-27 LAB — SEDIMENTATION RATE: Sed Rate: 39 mm/hr — ABNORMAL HIGH (ref 0–16)

## 2013-05-27 LAB — C-REACTIVE PROTEIN: CRP: <0.5 mg/dL — ABNORMAL LOW (ref <0.60)

## 2013-05-29 LAB — BASIC METABOLIC PANEL
BUN: 25 mg/dL — ABNORMAL HIGH (ref 6–23)
Calcium: 9 mg/dL (ref 8.4–10.5)
Chloride: 103 mEq/L (ref 96–112)
Creatinine, Ser: 1.41 mg/dL — ABNORMAL HIGH (ref 0.50–1.35)
GFR calc Af Amer: 54 mL/min — ABNORMAL LOW (ref >90)
GFR calc non Af Amer: 47 mL/min — ABNORMAL LOW (ref >90)
Potassium: 4.5 mEq/L (ref 3.5–5.1)

## 2013-05-30 ENCOUNTER — Emergency Department: Payer: Self-pay | Admitting: Emergency Medicine

## 2013-05-30 LAB — COMPREHENSIVE METABOLIC PANEL
Alkaline Phosphatase: 191 U/L — ABNORMAL HIGH (ref 50–136)
BUN: 27 mg/dL — ABNORMAL HIGH (ref 7–18)
Bilirubin,Total: 0.5 mg/dL (ref 0.2–1.0)
Chloride: 106 mmol/L (ref 98–107)
Glucose: 149 mg/dL — ABNORMAL HIGH (ref 65–99)
Osmolality: 284 (ref 275–301)
SGOT(AST): 97 U/L — ABNORMAL HIGH (ref 15–37)
SGPT (ALT): 62 U/L (ref 12–78)
Sodium: 138 mmol/L (ref 136–145)

## 2013-05-30 LAB — URINALYSIS, COMPLETE
Bilirubin,UR: NEGATIVE
Blood: NEGATIVE
Glucose,UR: 150 mg/dL (ref 0–75)
Nitrite: NEGATIVE
Ph: 6 (ref 4.5–8.0)
RBC,UR: 1 /HPF (ref 0–5)
Specific Gravity: 1.013 (ref 1.003–1.030)
Squamous Epithelial: NONE SEEN

## 2013-05-30 LAB — CK TOTAL AND CKMB (NOT AT ARMC): CK-MB: 0.6 ng/mL (ref 0.5–3.6)

## 2013-05-30 LAB — CBC
HCT: 33.6 % — ABNORMAL LOW (ref 40.0–52.0)
HGB: 11.3 g/dL — ABNORMAL LOW (ref 13.0–18.0)
MCH: 29.2 pg (ref 26.0–34.0)
MCHC: 33.7 g/dL (ref 32.0–36.0)
Platelet: 424 10*3/uL (ref 150–440)
RBC: 3.88 10*6/uL — ABNORMAL LOW (ref 4.40–5.90)
WBC: 11.3 10*3/uL — ABNORMAL HIGH (ref 3.8–10.6)

## 2013-05-30 LAB — TROPONIN I: Troponin-I: <0.02 ng/mL

## 2013-05-30 LAB — PRO B NATRIURETIC PEPTIDE: B-Type Natriuretic Peptide: 1223 pg/mL — ABNORMAL HIGH (ref 0–450)

## 2013-06-19 ENCOUNTER — Inpatient Hospital Stay: Payer: Self-pay | Admitting: Internal Medicine

## 2013-06-19 LAB — COMPREHENSIVE METABOLIC PANEL
Albumin: 3 g/dL — ABNORMAL LOW (ref 3.4–5.0)
Alkaline Phosphatase: 213 U/L — ABNORMAL HIGH
BUN: 28 mg/dL — ABNORMAL HIGH (ref 7–18)
Bilirubin,Total: 0.6 mg/dL (ref 0.2–1.0)
Calcium, Total: 10.1 mg/dL (ref 8.5–10.1)
Chloride: 108 mmol/L — ABNORMAL HIGH (ref 98–107)
Creatinine: 1.47 mg/dL — ABNORMAL HIGH (ref 0.60–1.30)
EGFR (Non-African Amer.): 46 — ABNORMAL LOW
Glucose: 210 mg/dL — ABNORMAL HIGH (ref 65–99)
Potassium: 4 mmol/L (ref 3.5–5.1)
SGOT(AST): 70 U/L — ABNORMAL HIGH (ref 15–37)
SGPT (ALT): 51 U/L (ref 12–78)
Sodium: 146 mmol/L — ABNORMAL HIGH (ref 136–145)

## 2013-06-19 LAB — CBC
HCT: 35.4 % — ABNORMAL LOW (ref 40.0–52.0)
HGB: 11.9 g/dL — ABNORMAL LOW (ref 13.0–18.0)
MCH: 28 pg (ref 26.0–34.0)
MCHC: 33.6 g/dL (ref 32.0–36.0)
Platelet: 369 10*3/uL (ref 150–440)
WBC: 14.5 10*3/uL — ABNORMAL HIGH (ref 3.8–10.6)

## 2013-06-19 LAB — URINALYSIS, COMPLETE
Bacteria: NONE SEEN
Bilirubin,UR: NEGATIVE
Ketone: NEGATIVE
Leukocyte Esterase: NEGATIVE
Ph: 7 (ref 4.5–8.0)
Protein: >=500
RBC,UR: 1 /HPF (ref 0–5)
Specific Gravity: 1.014 (ref 1.003–1.030)

## 2013-06-19 LAB — LACTATE DEHYDROGENASE: LDH: 400 U/L — ABNORMAL HIGH (ref 85–241)

## 2013-06-19 LAB — LIPASE, BLOOD: Lipase: 132 U/L (ref 73–393)

## 2013-06-20 LAB — COMPREHENSIVE METABOLIC PANEL
Albumin: 2.3 g/dL — ABNORMAL LOW (ref 3.4–5.0)
Alkaline Phosphatase: 162 U/L — ABNORMAL HIGH
Anion Gap: 3 — ABNORMAL LOW (ref 7–16)
BUN: 26 mg/dL — ABNORMAL HIGH (ref 7–18)
Bilirubin,Total: 0.7 mg/dL (ref 0.2–1.0)
Calcium, Total: 9 mg/dL (ref 8.5–10.1)
EGFR (African American): 59 — ABNORMAL LOW
EGFR (Non-African Amer.): 51 — ABNORMAL LOW
Potassium: 3.8 mmol/L (ref 3.5–5.1)
SGPT (ALT): 34 U/L (ref 12–78)
Sodium: 147 mmol/L — ABNORMAL HIGH (ref 136–145)
Total Protein: 6.4 g/dL (ref 6.4–8.2)

## 2013-06-20 LAB — CBC WITH DIFFERENTIAL/PLATELET
Basophil #: 0.1 10*3/uL (ref 0.0–0.1)
Basophil %: 0.5 %
Eosinophil %: 1.8 %
HGB: 9.9 g/dL — ABNORMAL LOW (ref 13.0–18.0)
Lymphocyte #: 2.5 10*3/uL (ref 1.0–3.6)
Lymphocyte %: 20.4 %
MCH: 28.7 pg (ref 26.0–34.0)
MCHC: 34.2 g/dL (ref 32.0–36.0)
Monocyte #: 1.1 x10 3/mm — ABNORMAL HIGH (ref 0.2–1.0)
Neutrophil #: 8.2 10*3/uL — ABNORMAL HIGH (ref 1.4–6.5)
Neutrophil %: 67.9 %
Platelet: 277 10*3/uL (ref 150–440)
RBC: 3.45 10*6/uL — ABNORMAL LOW (ref 4.40–5.90)
RDW: 19.7 % — ABNORMAL HIGH (ref 11.5–14.5)
WBC: 12.1 10*3/uL — ABNORMAL HIGH (ref 3.8–10.6)

## 2013-06-21 LAB — BASIC METABOLIC PANEL
Calcium, Total: 8.7 mg/dL (ref 8.5–10.1)
Chloride: 106 mmol/L (ref 98–107)
Co2: 26 mmol/L (ref 21–32)
EGFR (African American): >60
Osmolality: 282 (ref 275–301)

## 2013-06-22 LAB — CBC WITH DIFFERENTIAL/PLATELET
Basophil #: 0.1 10*3/uL (ref 0.0–0.1)
Basophil %: 0.7 %
HCT: 28.4 % — ABNORMAL LOW (ref 40.0–52.0)
HGB: 9.8 g/dL — ABNORMAL LOW (ref 13.0–18.0)
Lymphocyte #: 2.6 10*3/uL (ref 1.0–3.6)
Lymphocyte %: 24.7 %
MCH: 28.7 pg (ref 26.0–34.0)
MCHC: 34.6 g/dL (ref 32.0–36.0)
MCV: 83 fL (ref 80–100)
Monocyte #: 1 x10 3/mm (ref 0.2–1.0)
Monocyte %: 9.4 %
Platelet: 299 10*3/uL (ref 150–440)
RDW: 18.6 % — ABNORMAL HIGH (ref 11.5–14.5)
WBC: 10.7 10*3/uL — ABNORMAL HIGH (ref 3.8–10.6)

## 2013-06-24 LAB — CULTURE, BLOOD (SINGLE)

## 2013-06-29 ENCOUNTER — Encounter (HOSPITAL_BASED_OUTPATIENT_CLINIC_OR_DEPARTMENT_OTHER): Payer: Medicare Other | Attending: General Surgery

## 2013-06-29 DIAGNOSIS — I10 Essential (primary) hypertension: Secondary | ICD-10-CM | POA: Insufficient documentation

## 2013-06-29 DIAGNOSIS — E119 Type 2 diabetes mellitus without complications: Secondary | ICD-10-CM | POA: Insufficient documentation

## 2013-06-29 DIAGNOSIS — J449 Chronic obstructive pulmonary disease, unspecified: Secondary | ICD-10-CM | POA: Insufficient documentation

## 2013-06-29 DIAGNOSIS — L89609 Pressure ulcer of unspecified heel, unspecified stage: Secondary | ICD-10-CM | POA: Insufficient documentation

## 2013-06-29 DIAGNOSIS — J4489 Other specified chronic obstructive pulmonary disease: Secondary | ICD-10-CM | POA: Insufficient documentation

## 2013-06-29 DIAGNOSIS — L8993 Pressure ulcer of unspecified site, stage 3: Secondary | ICD-10-CM | POA: Insufficient documentation

## 2013-06-30 NOTE — Progress Notes (Signed)
[facility]  NAME:  Alan Mcintyre, Alan Mcintyre NO.:  [id number]  MEDICAL RECORD NO.:  [id number]      DATE OF BIRTH:  June 25, 1935  PHYSICIAN:  Judene Companion, M.D.           VISIT DATE:                                  OFFICE VISIT   HISTORY OF PRESENT ILLNESS:  This is a 78 year old African-American male who has been a patient here many times in the past, with pressure ulcers on his sacrum and he returns now with one on his heel.  He was recently in the hospital with a bowel obstruction that required surgery and later he had a colostomy that was put back to a normal position.  He is a diabetic.  He has COPD.  There is also a history of schizophrenia. Today, he was found to have a blood pressure of 130/75, respirations 16, pulse 80, temperature 97.7, and he weighs 172 pounds.  So, his diagnoses is diabetes, pressure sore on his right heel, history of pressure sores on his sacrum and his heels in the past, history of recent bowel obstruction with surgery.  He has an ulcer that is approximately a centimeter in diameter that I debrided and was able to get down to good healthy tissue, and we are going to treat it with off- loading him and starting giving him Santyl treatments.  I talked to the people that run the nursing home where he is and they are going to put a new Santyl dressing on daily.  He will return here in a week.  DIAGNOSES:  Pressure ulcer stage III, right heel; history of diabetes; history of schizophrenia; history of hypertension.     Judene Companion, M.D.     PP/MEDQ  D:  06/29/2013  T:  06/30/2013  Job:  [id number]

## 2013-07-20 ENCOUNTER — Encounter (HOSPITAL_BASED_OUTPATIENT_CLINIC_OR_DEPARTMENT_OTHER): Payer: Medicare Other | Attending: General Surgery

## 2013-07-20 DIAGNOSIS — L89609 Pressure ulcer of unspecified heel, unspecified stage: Secondary | ICD-10-CM | POA: Insufficient documentation

## 2013-07-20 DIAGNOSIS — L8993 Pressure ulcer of unspecified site, stage 3: Secondary | ICD-10-CM | POA: Insufficient documentation

## 2013-07-27 LAB — GLUCOSE, CAPILLARY: Glucose-Capillary: 96 mg/dL (ref 70–99)

## 2013-08-14 ENCOUNTER — Inpatient Hospital Stay: Payer: Self-pay | Admitting: Internal Medicine

## 2013-08-14 LAB — COMPREHENSIVE METABOLIC PANEL
ALK PHOS: 108 U/L
ANION GAP: 4 — AB (ref 7–16)
Albumin: 2 g/dL — ABNORMAL LOW (ref 3.4–5.0)
BILIRUBIN TOTAL: 1.2 mg/dL — AB (ref 0.2–1.0)
BUN: 20 mg/dL — ABNORMAL HIGH (ref 7–18)
CHLORIDE: 110 mmol/L — AB (ref 98–107)
Calcium, Total: 7.9 mg/dL — ABNORMAL LOW (ref 8.5–10.1)
Co2: 29 mmol/L (ref 21–32)
Creatinine: 1.66 mg/dL — ABNORMAL HIGH (ref 0.60–1.30)
EGFR (Non-African Amer.): 39 — ABNORMAL LOW
GFR CALC AF AMER: 46 — AB
Glucose: 179 mg/dL — ABNORMAL HIGH (ref 65–99)
OSMOLALITY: 292 (ref 275–301)
POTASSIUM: 3.5 mmol/L (ref 3.5–5.1)
SGOT(AST): 68 U/L — ABNORMAL HIGH (ref 15–37)
SGPT (ALT): 47 U/L (ref 12–78)
Sodium: 143 mmol/L (ref 136–145)
TOTAL PROTEIN: 5.8 g/dL — AB (ref 6.4–8.2)

## 2013-08-14 LAB — URINALYSIS, COMPLETE
BILIRUBIN, UR: NEGATIVE
Bacteria: NONE SEEN
Ketone: NEGATIVE
Leukocyte Esterase: NEGATIVE
Nitrite: NEGATIVE
Ph: 6 (ref 4.5–8.0)
SPECIFIC GRAVITY: 1.013 (ref 1.003–1.030)

## 2013-08-14 LAB — CBC WITH DIFFERENTIAL/PLATELET
BASOS ABS: 0.1 10*3/uL (ref 0.0–0.1)
BASOS PCT: 0.8 %
EOS ABS: 0 10*3/uL (ref 0.0–0.7)
EOS PCT: 0 %
HCT: 29.5 % — AB (ref 40.0–52.0)
HGB: 9.8 g/dL — ABNORMAL LOW (ref 13.0–18.0)
LYMPHS ABS: 2 10*3/uL (ref 1.0–3.6)
LYMPHS PCT: 15.2 %
MCH: 27.5 pg (ref 26.0–34.0)
MCHC: 33.3 g/dL (ref 32.0–36.0)
MCV: 82 fL (ref 80–100)
Monocyte #: 1.3 x10 3/mm — ABNORMAL HIGH (ref 0.2–1.0)
Monocyte %: 9.6 %
NEUTROS PCT: 74.4 %
Neutrophil #: 10 10*3/uL — ABNORMAL HIGH (ref 1.4–6.5)
PLATELETS: 400 10*3/uL (ref 150–440)
RBC: 3.58 10*6/uL — ABNORMAL LOW (ref 4.40–5.90)
RDW: 31 % — AB (ref 11.5–14.5)
WBC: 13.5 10*3/uL — ABNORMAL HIGH (ref 3.8–10.6)

## 2013-08-14 LAB — URIC ACID: Uric Acid: 6.1 mg/dL

## 2013-08-14 LAB — RAPID INFLUENZA A&B ANTIGENS

## 2013-08-14 LAB — TROPONIN I
TROPONIN-I: 0.07 ng/mL — AB
TROPONIN-I: 0.08 ng/mL — AB

## 2013-08-14 LAB — CK: CK, Total: 664 U/L — ABNORMAL HIGH (ref 35–232)

## 2013-08-14 LAB — BILIRUBIN, DIRECT: Bilirubin, Direct: 0.6 mg/dL — ABNORMAL HIGH (ref 0.00–0.20)

## 2013-08-14 LAB — SEDIMENTATION RATE: Erythrocyte Sed Rate: 17 mm/hr (ref 0–20)

## 2013-08-15 ENCOUNTER — Ambulatory Visit: Payer: Self-pay | Admitting: Student

## 2013-08-15 LAB — CBC WITH DIFFERENTIAL/PLATELET
Basophil #: 0.1 10*3/uL (ref 0.0–0.1)
Basophil %: 1 %
Eosinophil #: 0.1 10*3/uL (ref 0.0–0.7)
Eosinophil %: 0.6 %
HCT: 25.9 % — ABNORMAL LOW (ref 40.0–52.0)
HGB: 8.4 g/dL — AB (ref 13.0–18.0)
Lymphocyte #: 3 10*3/uL (ref 1.0–3.6)
Lymphocyte %: 26 %
MCH: 26.4 pg (ref 26.0–34.0)
MCHC: 32.5 g/dL (ref 32.0–36.0)
MCV: 81 fL (ref 80–100)
MONO ABS: 1.5 x10 3/mm — AB (ref 0.2–1.0)
MONOS PCT: 12.6 %
NEUTROS PCT: 59.8 %
Neutrophil #: 7 10*3/uL — ABNORMAL HIGH (ref 1.4–6.5)
Platelet: 329 10*3/uL (ref 150–440)
RBC: 3.19 10*6/uL — ABNORMAL LOW (ref 4.40–5.90)
RDW: 28.9 % — ABNORMAL HIGH (ref 11.5–14.5)
WBC: 11.6 10*3/uL — ABNORMAL HIGH (ref 3.8–10.6)

## 2013-08-15 LAB — COMPREHENSIVE METABOLIC PANEL
ALBUMIN: 1.8 g/dL — AB (ref 3.4–5.0)
ALT: 39 U/L (ref 12–78)
ANION GAP: 3 — AB (ref 7–16)
AST: 69 U/L — AB (ref 15–37)
Alkaline Phosphatase: 93 U/L
BUN: 22 mg/dL — ABNORMAL HIGH (ref 7–18)
Bilirubin,Total: 1 mg/dL (ref 0.2–1.0)
CREATININE: 1.67 mg/dL — AB (ref 0.60–1.30)
Calcium, Total: 8.3 mg/dL — ABNORMAL LOW (ref 8.5–10.1)
Chloride: 107 mmol/L (ref 98–107)
Co2: 30 mmol/L (ref 21–32)
EGFR (African American): 45 — ABNORMAL LOW
GFR CALC NON AF AMER: 39 — AB
Glucose: 163 mg/dL — ABNORMAL HIGH (ref 65–99)
OSMOLALITY: 286 (ref 275–301)
Potassium: 3.4 mmol/L — ABNORMAL LOW (ref 3.5–5.1)
Sodium: 140 mmol/L (ref 136–145)
TOTAL PROTEIN: 5.3 g/dL — AB (ref 6.4–8.2)

## 2013-08-15 LAB — SYNOVIAL CELL COUNT + DIFF, W/ CRYSTALS
Basophil: 0 %
Basophil: 0 %
CRYSTALS, JOINT FLUID: NONE SEEN
Crystals, Joint Fluid: NONE SEEN
Eosinophil: 0 %
Eosinophil: 0 %
LYMPHS PCT: 0 %
Lymphocytes: 1 %
NUCLEATED CELL COUNT: 1271 /mm3
Neutrophils: 100 %
Neutrophils: 84 %
Nucleated Cell Count: 4869 /mm3
OTHER CELLS BF: 0 %
OTHER CELLS BF: 0 %
OTHER MONONUCLEAR CELLS: 0 %
Other Mononuclear Cells: 15 %

## 2013-08-15 LAB — CK: CK, Total: 747 U/L — ABNORMAL HIGH (ref 35–232)

## 2013-08-15 LAB — TROPONIN I: TROPONIN-I: 0.07 ng/mL — AB

## 2013-08-16 LAB — BASIC METABOLIC PANEL
Anion Gap: 5 — ABNORMAL LOW (ref 7–16)
BUN: 17 mg/dL (ref 7–18)
CALCIUM: 8.3 mg/dL — AB (ref 8.5–10.1)
CREATININE: 1.43 mg/dL — AB (ref 0.60–1.30)
Chloride: 108 mmol/L — ABNORMAL HIGH (ref 98–107)
Co2: 28 mmol/L (ref 21–32)
EGFR (African American): 55 — ABNORMAL LOW
EGFR (Non-African Amer.): 47 — ABNORMAL LOW
Glucose: 150 mg/dL — ABNORMAL HIGH (ref 65–99)
OSMOLALITY: 286 (ref 275–301)
POTASSIUM: 3.6 mmol/L (ref 3.5–5.1)
SODIUM: 141 mmol/L (ref 136–145)

## 2013-08-16 LAB — URINE CULTURE

## 2013-08-16 LAB — CK: CK, Total: 228 U/L (ref 35–232)

## 2013-08-19 LAB — BODY FLUID CULTURE

## 2013-08-19 LAB — CULTURE, BLOOD (SINGLE)

## 2013-08-21 ENCOUNTER — Inpatient Hospital Stay: Payer: Self-pay | Admitting: Internal Medicine

## 2013-08-21 LAB — URINALYSIS, COMPLETE
BILIRUBIN, UR: NEGATIVE
Glucose,UR: NEGATIVE mg/dL (ref 0–75)
Ketone: NEGATIVE
NITRITE: NEGATIVE
PH: 6 (ref 4.5–8.0)
Protein: 100
SPECIFIC GRAVITY: 1.015 (ref 1.003–1.030)

## 2013-08-21 LAB — COMPREHENSIVE METABOLIC PANEL
ALBUMIN: 2.2 g/dL — AB (ref 3.4–5.0)
ALT: 47 U/L (ref 12–78)
Alkaline Phosphatase: 100 U/L
Anion Gap: 4 — ABNORMAL LOW (ref 7–16)
BILIRUBIN TOTAL: 0.6 mg/dL (ref 0.2–1.0)
BUN: 19 mg/dL — AB (ref 7–18)
CREATININE: 1.49 mg/dL — AB (ref 0.60–1.30)
Calcium, Total: 8.5 mg/dL (ref 8.5–10.1)
Chloride: 110 mmol/L — ABNORMAL HIGH (ref 98–107)
Co2: 31 mmol/L (ref 21–32)
EGFR (African American): 52 — ABNORMAL LOW
GFR CALC NON AF AMER: 45 — AB
Glucose: 125 mg/dL — ABNORMAL HIGH (ref 65–99)
Osmolality: 292 (ref 275–301)
Potassium: 3.5 mmol/L (ref 3.5–5.1)
SGOT(AST): 62 U/L — ABNORMAL HIGH (ref 15–37)
Sodium: 145 mmol/L (ref 136–145)
Total Protein: 5.9 g/dL — ABNORMAL LOW (ref 6.4–8.2)

## 2013-08-21 LAB — CBC
HCT: 27.7 % — ABNORMAL LOW (ref 40.0–52.0)
HGB: 9.1 g/dL — AB (ref 13.0–18.0)
MCH: 26.1 pg (ref 26.0–34.0)
MCHC: 32.8 g/dL (ref 32.0–36.0)
MCV: 80 fL (ref 80–100)
Platelet: 347 10*3/uL (ref 150–440)
RBC: 3.47 10*6/uL — AB (ref 4.40–5.90)
RDW: 31.4 % — ABNORMAL HIGH (ref 11.5–14.5)
WBC: 7.9 10*3/uL (ref 3.8–10.6)

## 2013-08-21 LAB — TROPONIN I
Troponin-I: <0.02 ng/mL
Troponin-I: <0.02 ng/mL

## 2013-08-21 LAB — CK TOTAL AND CKMB (NOT AT ARMC)
CK, TOTAL: 18 U/L — AB
CK, TOTAL: 19 U/L — AB
CK, Total: 23 U/L — ABNORMAL LOW
CK-MB: 1.2 ng/mL (ref 0.5–3.6)
CK-MB: 1.1 ng/mL (ref 0.5–3.6)
CK-MB: 0.9 ng/mL (ref 0.5–3.6)

## 2013-08-21 LAB — ETHANOL
Ethanol %: <0.003 % (ref 0.000–0.080)
Ethanol: <3 mg/dL

## 2013-08-21 LAB — AMMONIA

## 2013-08-22 LAB — CBC WITH DIFFERENTIAL/PLATELET
Basophil #: 0 10*3/uL (ref 0.0–0.1)
Basophil %: 0.3 %
EOS ABS: 0.2 10*3/uL (ref 0.0–0.7)
Eosinophil %: 2.1 %
HCT: 24.4 % — ABNORMAL LOW (ref 40.0–52.0)
HGB: 8.4 g/dL — AB (ref 13.0–18.0)
LYMPHS ABS: 2.2 10*3/uL (ref 1.0–3.6)
Lymphocyte %: 25.7 %
MCH: 27.4 pg (ref 26.0–34.0)
MCHC: 34.3 g/dL (ref 32.0–36.0)
MCV: 80 fL (ref 80–100)
Monocyte #: 0.8 x10 3/mm (ref 0.2–1.0)
Monocyte %: 9.9 %
Neutrophil #: 5.2 10*3/uL (ref 1.4–6.5)
Neutrophil %: 62 %
PLATELETS: 328 10*3/uL (ref 150–440)
RBC: 3.05 10*6/uL — ABNORMAL LOW (ref 4.40–5.90)
RDW: 33.2 % — ABNORMAL HIGH (ref 11.5–14.5)
WBC: 8.5 10*3/uL (ref 3.8–10.6)

## 2013-08-23 LAB — URINE CULTURE

## 2013-09-17 ENCOUNTER — Emergency Department: Payer: Self-pay | Admitting: Emergency Medicine

## 2013-09-17 LAB — URINALYSIS, COMPLETE
BILIRUBIN, UR: NEGATIVE
BLOOD: NEGATIVE
Bacteria: NONE SEEN
Glucose,UR: 50 mg/dL (ref 0–75)
Ketone: NEGATIVE
LEUKOCYTE ESTERASE: NEGATIVE
Nitrite: NEGATIVE
Ph: 6 (ref 4.5–8.0)
Protein: 100
Specific Gravity: 1.013 (ref 1.003–1.030)
Squamous Epithelial: NONE SEEN
WBC UR: 4 /HPF (ref 0–5)

## 2013-09-17 LAB — COMPREHENSIVE METABOLIC PANEL
AST: 40 U/L — AB (ref 15–37)
Albumin: 2.3 g/dL — ABNORMAL LOW (ref 3.4–5.0)
Alkaline Phosphatase: 122 U/L — ABNORMAL HIGH
Anion Gap: 3 — ABNORMAL LOW (ref 7–16)
BUN: 15 mg/dL (ref 7–18)
Bilirubin,Total: 0.8 mg/dL (ref 0.2–1.0)
CALCIUM: 8.7 mg/dL (ref 8.5–10.1)
Chloride: 107 mmol/L (ref 98–107)
Co2: 31 mmol/L (ref 21–32)
Creatinine: 1.35 mg/dL — ABNORMAL HIGH (ref 0.60–1.30)
EGFR (African American): 59 — ABNORMAL LOW
EGFR (Non-African Amer.): 51 — ABNORMAL LOW
Glucose: 67 mg/dL (ref 65–99)
Osmolality: 280 (ref 275–301)
Potassium: 3.8 mmol/L (ref 3.5–5.1)
SGPT (ALT): 19 U/L (ref 12–78)
Sodium: 141 mmol/L (ref 136–145)
Total Protein: 7.7 g/dL (ref 6.4–8.2)

## 2013-09-17 LAB — CBC
HCT: 26.5 % — ABNORMAL LOW (ref 40.0–52.0)
HGB: 8.8 g/dL — ABNORMAL LOW (ref 13.0–18.0)
MCH: 24.1 pg — ABNORMAL LOW (ref 26.0–34.0)
MCHC: 33.3 g/dL (ref 32.0–36.0)
MCV: 73 fL — ABNORMAL LOW (ref 80–100)
Platelet: 718 10*3/uL — ABNORMAL HIGH (ref 150–440)
RBC: 3.65 10*6/uL — AB (ref 4.40–5.90)
RDW: 38.7 % — ABNORMAL HIGH (ref 11.5–14.5)
WBC: 11.4 10*3/uL — ABNORMAL HIGH (ref 3.8–10.6)

## 2013-09-17 LAB — LIPASE, BLOOD: Lipase: 127 U/L (ref 73–393)

## 2013-09-17 LAB — TROPONIN I: Troponin-I: <0.02 ng/mL

## 2013-09-22 ENCOUNTER — Emergency Department: Payer: Self-pay | Admitting: Emergency Medicine

## 2013-09-22 LAB — COMPREHENSIVE METABOLIC PANEL
ALK PHOS: 113 U/L
Albumin: 2.2 g/dL — ABNORMAL LOW (ref 3.4–5.0)
Anion Gap: 3 — ABNORMAL LOW (ref 7–16)
BUN: 14 mg/dL (ref 7–18)
Bilirubin,Total: 1.1 mg/dL — ABNORMAL HIGH (ref 0.2–1.0)
Calcium, Total: 8.9 mg/dL (ref 8.5–10.1)
Chloride: 108 mmol/L — ABNORMAL HIGH (ref 98–107)
Co2: 30 mmol/L (ref 21–32)
Creatinine: 1.19 mg/dL (ref 0.60–1.30)
EGFR (African American): >60
EGFR (Non-African Amer.): 59 — ABNORMAL LOW
Glucose: 164 mg/dL — ABNORMAL HIGH (ref 65–99)
Osmolality: 285 (ref 275–301)
POTASSIUM: 4.3 mmol/L (ref 3.5–5.1)
SGOT(AST): 48 U/L — ABNORMAL HIGH (ref 15–37)
SGPT (ALT): 17 U/L (ref 12–78)
SODIUM: 141 mmol/L (ref 136–145)
Total Protein: 7 g/dL (ref 6.4–8.2)

## 2013-09-22 LAB — CBC
HCT: 25.8 % — AB (ref 40.0–52.0)
HGB: 8.4 g/dL — AB (ref 13.0–18.0)
MCH: 23.8 pg — ABNORMAL LOW (ref 26.0–34.0)
MCHC: 32.7 g/dL (ref 32.0–36.0)
MCV: 73 fL — ABNORMAL LOW (ref 80–100)
Platelet: 609 10*3/uL — ABNORMAL HIGH (ref 150–440)
RBC: 3.53 10*6/uL — ABNORMAL LOW (ref 4.40–5.90)
RDW: 39.7 % — ABNORMAL HIGH (ref 11.5–14.5)
WBC: 12.3 10*3/uL — AB (ref 3.8–10.6)

## 2013-09-22 LAB — PROTIME-INR
INR: 1
Prothrombin Time: 13.2 secs (ref 11.5–14.7)

## 2013-09-22 LAB — CK TOTAL AND CKMB (NOT AT ARMC)
CK, TOTAL: 128 U/L
CK-MB: 0.8 ng/mL (ref 0.5–3.6)

## 2013-09-22 LAB — TROPONIN I: Troponin-I: <0.02 ng/mL

## 2013-09-22 LAB — APTT: Activated PTT: 32.3 secs (ref 23.6–35.9)

## 2013-10-10 LAB — BODY FLUID CELL COUNT WITH DIFFERENTIAL
Basophil: 0 %
Eosinophil: 0 %
Lymphocytes: 1 %
NUCLEATED CELL COUNT: 2416 /mm3
Neutrophils: 95 %
OTHER CELLS BF: 0 %
Other Mononuclear Cells: 4 %

## 2013-10-10 LAB — COMPREHENSIVE METABOLIC PANEL
ANION GAP: 4 — AB (ref 7–16)
Albumin: 2.5 g/dL — ABNORMAL LOW (ref 3.4–5.0)
Alkaline Phosphatase: 117 U/L
BUN: 22 mg/dL — ABNORMAL HIGH (ref 7–18)
Bilirubin,Total: 0.7 mg/dL (ref 0.2–1.0)
CO2: 30 mmol/L (ref 21–32)
Calcium, Total: 8.7 mg/dL (ref 8.5–10.1)
Chloride: 109 mmol/L — ABNORMAL HIGH (ref 98–107)
Creatinine: 1.67 mg/dL — ABNORMAL HIGH (ref 0.60–1.30)
EGFR (Non-African Amer.): 39 — ABNORMAL LOW
GFR CALC AF AMER: 45 — AB
Glucose: 143 mg/dL — ABNORMAL HIGH (ref 65–99)
Osmolality: 291 (ref 275–301)
Potassium: 3.8 mmol/L (ref 3.5–5.1)
SGOT(AST): 30 U/L (ref 15–37)
SGPT (ALT): 21 U/L (ref 12–78)
SODIUM: 143 mmol/L (ref 136–145)
Total Protein: 7.5 g/dL (ref 6.4–8.2)

## 2013-10-10 LAB — URINALYSIS, COMPLETE
BILIRUBIN, UR: NEGATIVE
BLOOD: NEGATIVE
Ketone: NEGATIVE
LEUKOCYTE ESTERASE: NEGATIVE
Nitrite: NEGATIVE
PH: 5 (ref 4.5–8.0)
RBC,UR: 2 /HPF (ref 0–5)
Specific Gravity: 1.014 (ref 1.003–1.030)
Squamous Epithelial: NONE SEEN
WBC UR: 13 /HPF (ref 0–5)

## 2013-10-10 LAB — CBC WITH DIFFERENTIAL/PLATELET
BASOS ABS: 0 10*3/uL (ref 0.0–0.1)
Basophil %: 0.5 %
EOS ABS: 0.1 10*3/uL (ref 0.0–0.7)
Eosinophil %: 0.8 %
HCT: 29.3 % — AB (ref 40.0–52.0)
HGB: 9.3 g/dL — AB (ref 13.0–18.0)
LYMPHS PCT: 16.1 %
Lymphocyte #: 1.7 10*3/uL (ref 1.0–3.6)
MCH: 25 pg — ABNORMAL LOW (ref 26.0–34.0)
MCHC: 31.8 g/dL — AB (ref 32.0–36.0)
MCV: 79 fL — ABNORMAL LOW (ref 80–100)
MONO ABS: 1.2 x10 3/mm — AB (ref 0.2–1.0)
MONOS PCT: 11.2 %
Neutrophil #: 7.5 10*3/uL — ABNORMAL HIGH (ref 1.4–6.5)
Neutrophil %: 71.4 %
Platelet: 456 10*3/uL — ABNORMAL HIGH (ref 150–440)
RBC: 3.73 10*6/uL — AB (ref 4.40–5.90)
RDW: 35.2 % — ABNORMAL HIGH (ref 11.5–14.5)
WBC: 10.5 10*3/uL (ref 3.8–10.6)

## 2013-10-10 LAB — SYNOVIAL FLUID, CRYSTAL: CRYSTALS, JOINT FLUID: NONE SEEN

## 2013-10-10 LAB — TROPONIN I

## 2013-10-10 LAB — SEDIMENTATION RATE: Erythrocyte Sed Rate: 47 mm/hr — ABNORMAL HIGH (ref 0–20)

## 2013-10-10 LAB — TSH: THYROID STIMULATING HORM: 3.05 u[IU]/mL

## 2013-10-11 LAB — BASIC METABOLIC PANEL
Anion Gap: 5 — ABNORMAL LOW (ref 7–16)
BUN: 19 mg/dL — ABNORMAL HIGH (ref 7–18)
CHLORIDE: 109 mmol/L — AB (ref 98–107)
CO2: 29 mmol/L (ref 21–32)
Calcium, Total: 8.7 mg/dL (ref 8.5–10.1)
Creatinine: 1.51 mg/dL — ABNORMAL HIGH (ref 0.60–1.30)
EGFR (African American): 51 — ABNORMAL LOW
EGFR (Non-African Amer.): 44 — ABNORMAL LOW
GLUCOSE: 203 mg/dL — AB (ref 65–99)
OSMOLALITY: 293 (ref 275–301)
Potassium: 3.8 mmol/L (ref 3.5–5.1)
Sodium: 143 mmol/L (ref 136–145)

## 2013-10-11 LAB — CBC WITH DIFFERENTIAL/PLATELET
BASOS PCT: 0.7 %
Basophil #: 0.1 10*3/uL (ref 0.0–0.1)
Eosinophil #: 0.1 10*3/uL (ref 0.0–0.7)
Eosinophil %: 1.1 %
HCT: 23.4 % — ABNORMAL LOW (ref 40.0–52.0)
HGB: 7.4 g/dL — AB (ref 13.0–18.0)
Lymphocyte #: 2.4 10*3/uL (ref 1.0–3.6)
Lymphocyte %: 25.6 %
MCH: 25 pg — AB (ref 26.0–34.0)
MCHC: 31.7 g/dL — AB (ref 32.0–36.0)
MCV: 79 fL — ABNORMAL LOW (ref 80–100)
MONOS PCT: 11.5 %
Monocyte #: 1.1 x10 3/mm — ABNORMAL HIGH (ref 0.2–1.0)
NEUTROS PCT: 61.1 %
Neutrophil #: 5.7 10*3/uL (ref 1.4–6.5)
Platelet: 286 10*3/uL (ref 150–440)
RBC: 2.97 10*6/uL — ABNORMAL LOW (ref 4.40–5.90)
RDW: 33.9 % — AB (ref 11.5–14.5)
WBC: 9.3 10*3/uL (ref 3.8–10.6)

## 2013-10-12 ENCOUNTER — Inpatient Hospital Stay: Payer: Self-pay | Admitting: Student

## 2013-10-12 LAB — BASIC METABOLIC PANEL
Anion Gap: 2 — ABNORMAL LOW (ref 7–16)
BUN: 22 mg/dL — ABNORMAL HIGH (ref 7–18)
CREATININE: 1.43 mg/dL — AB (ref 0.60–1.30)
Calcium, Total: 7.9 mg/dL — ABNORMAL LOW (ref 8.5–10.1)
Chloride: 107 mmol/L (ref 98–107)
Co2: 30 mmol/L (ref 21–32)
EGFR (African American): 55 — ABNORMAL LOW
EGFR (Non-African Amer.): 47 — ABNORMAL LOW
Glucose: 129 mg/dL — ABNORMAL HIGH (ref 65–99)
Osmolality: 283 (ref 275–301)
Potassium: 3.5 mmol/L (ref 3.5–5.1)
Sodium: 139 mmol/L (ref 136–145)

## 2013-10-12 LAB — URINE CULTURE

## 2013-10-13 LAB — CBC WITH DIFFERENTIAL/PLATELET
BASOS ABS: 0 10*3/uL (ref 0.0–0.1)
Basophil %: 0.3 %
EOS PCT: 4.4 %
Eosinophil #: 0.4 10*3/uL (ref 0.0–0.7)
HCT: 24.1 % — ABNORMAL LOW (ref 40.0–52.0)
HGB: 7.8 g/dL — AB (ref 13.0–18.0)
LYMPHS ABS: 1.9 10*3/uL (ref 1.0–3.6)
LYMPHS PCT: 19.9 %
MCH: 25.3 pg — AB (ref 26.0–34.0)
MCHC: 32.5 g/dL (ref 32.0–36.0)
MCV: 78 fL — ABNORMAL LOW (ref 80–100)
Monocyte #: 0.9 x10 3/mm (ref 0.2–1.0)
Monocyte %: 9.4 %
Neutrophil #: 6.5 10*3/uL (ref 1.4–6.5)
Neutrophil %: 66 %
PLATELETS: 347 10*3/uL (ref 150–440)
RBC: 3.1 10*6/uL — ABNORMAL LOW (ref 4.40–5.90)
RDW: 30.7 % — AB (ref 11.5–14.5)
WBC: 9.8 10*3/uL (ref 3.8–10.6)

## 2013-10-13 LAB — HEMOGLOBIN A1C: Hemoglobin A1C: <3.5 % — ABNORMAL LOW (ref 4.2–6.3)

## 2013-10-14 LAB — BODY FLUID CULTURE

## 2013-10-15 LAB — CULTURE, BLOOD (SINGLE)

## 2013-10-19 ENCOUNTER — Encounter: Payer: Self-pay | Admitting: Surgery

## 2013-11-03 ENCOUNTER — Ambulatory Visit: Payer: Self-pay | Admitting: Vascular Surgery

## 2013-11-03 LAB — BASIC METABOLIC PANEL
ANION GAP: 2 — AB (ref 7–16)
BUN: 18 mg/dL (ref 7–18)
CREATININE: 1.59 mg/dL — AB (ref 0.60–1.30)
Calcium, Total: 8.6 mg/dL (ref 8.5–10.1)
Chloride: 108 mmol/L — ABNORMAL HIGH (ref 98–107)
Co2: 32 mmol/L (ref 21–32)
EGFR (African American): 48 — ABNORMAL LOW
EGFR (Non-African Amer.): 42 — ABNORMAL LOW
GLUCOSE: 55 mg/dL — AB (ref 65–99)
OSMOLALITY: 283 (ref 275–301)
POTASSIUM: 3.5 mmol/L (ref 3.5–5.1)
Sodium: 142 mmol/L (ref 136–145)

## 2013-11-03 LAB — APTT: ACTIVATED PTT: 89.4 s — AB (ref 23.6–35.9)

## 2013-11-04 LAB — BASIC METABOLIC PANEL
ANION GAP: 3 — AB (ref 7–16)
BUN: 18 mg/dL (ref 7–18)
CO2: 32 mmol/L (ref 21–32)
Calcium, Total: 8 mg/dL — ABNORMAL LOW (ref 8.5–10.1)
Chloride: 105 mmol/L (ref 98–107)
Creatinine: 1.48 mg/dL — ABNORMAL HIGH (ref 0.60–1.30)
EGFR (Non-African Amer.): 45 — ABNORMAL LOW
GFR CALC AF AMER: 53 — AB
GLUCOSE: 270 mg/dL — AB (ref 65–99)
Osmolality: 291 (ref 275–301)
POTASSIUM: 3.6 mmol/L (ref 3.5–5.1)
Sodium: 140 mmol/L (ref 136–145)

## 2013-11-04 LAB — APTT
ACTIVATED PTT: 119.9 s — AB (ref 23.6–35.9)
ACTIVATED PTT: 62 s — AB (ref 23.6–35.9)
Activated PTT: 92.1 secs — ABNORMAL HIGH (ref 23.6–35.9)

## 2013-11-04 LAB — HEMOGLOBIN: HGB: 7.4 g/dL — ABNORMAL LOW (ref 13.0–18.0)

## 2013-11-04 LAB — PLATELET COUNT: PLATELETS: 341 10*3/uL (ref 150–440)

## 2013-11-05 LAB — APTT
ACTIVATED PTT: 58.7 s — AB (ref 23.6–35.9)
ACTIVATED PTT: 83.7 s — AB (ref 23.6–35.9)

## 2013-11-06 LAB — PLATELET COUNT: Platelet: 296 10*3/uL (ref 150–440)

## 2013-11-06 LAB — APTT
Activated PTT: 37.4 s — ABNORMAL HIGH
Activated PTT: 37.1 s — ABNORMAL HIGH

## 2013-11-06 LAB — HEMOGLOBIN: HGB: 7.4 g/dL — AB (ref 13.0–18.0)

## 2013-11-12 LAB — WOUND AEROBIC CULTURE

## 2013-11-13 ENCOUNTER — Encounter: Payer: Self-pay | Admitting: Surgery

## 2013-12-14 ENCOUNTER — Encounter: Payer: Self-pay | Admitting: Surgery

## 2013-12-21 ENCOUNTER — Encounter: Payer: Self-pay | Admitting: Surgery

## 2014-01-04 ENCOUNTER — Ambulatory Visit: Payer: Self-pay | Admitting: Oncology

## 2014-01-04 LAB — CBC CANCER CENTER
Basophil #: 0 x10 3/mm (ref 0.0–0.1)
Basophil %: 0.7 %
EOS ABS: 0.3 x10 3/mm (ref 0.0–0.7)
Eosinophil %: 4.7 %
HCT: 26.9 % — ABNORMAL LOW (ref 40.0–52.0)
HGB: 9 g/dL — AB (ref 13.0–18.0)
LYMPHS ABS: 1.8 x10 3/mm (ref 1.0–3.6)
LYMPHS PCT: 26.2 %
MCH: 27.7 pg (ref 26.0–34.0)
MCHC: 33.3 g/dL (ref 32.0–36.0)
MCV: 83 fL (ref 80–100)
MONOS PCT: 10.3 %
Monocyte #: 0.7 x10 3/mm (ref 0.2–1.0)
NEUTROS ABS: 4.1 x10 3/mm (ref 1.4–6.5)
Neutrophil %: 58.1 %
PLATELETS: 395 x10 3/mm (ref 150–440)
RBC: 3.23 10*6/uL — ABNORMAL LOW (ref 4.40–5.90)
RDW: 21.9 % — AB (ref 11.5–14.5)
WBC: 7 x10 3/mm (ref 3.8–10.6)

## 2014-01-04 LAB — IRON AND TIBC
Iron Bind.Cap.(Total): 263 ug/dL (ref 250–450)
Iron Saturation: 22 %
Iron: 58 ug/dL — ABNORMAL LOW (ref 65–175)
UNBOUND IRON-BIND. CAP.: 205 ug/dL

## 2014-01-04 LAB — SEDIMENTATION RATE: Erythrocyte Sed Rate: 39 mm/hr — ABNORMAL HIGH (ref 0–20)

## 2014-01-04 LAB — RETICULOCYTES
Absolute Retic Count: 0.0638 10*6/uL (ref 0.019–0.186)
Reticulocyte: 1.98 % (ref 0.4–3.1)

## 2014-01-04 LAB — LACTATE DEHYDROGENASE: LDH: 254 U/L — ABNORMAL HIGH (ref 85–241)

## 2014-01-04 LAB — FOLATE: Folic Acid: 19.9 ng/mL (ref 3.1–100.0)

## 2014-01-04 LAB — FERRITIN: FERRITIN (ARMC): 234 ng/mL (ref 8–388)

## 2014-01-06 ENCOUNTER — Ambulatory Visit: Payer: Self-pay | Admitting: Vascular Surgery

## 2014-01-08 ENCOUNTER — Inpatient Hospital Stay: Payer: Self-pay | Admitting: Internal Medicine

## 2014-01-08 LAB — URINALYSIS, COMPLETE
Bacteria: NONE SEEN
Bilirubin,UR: NEGATIVE
Blood: NEGATIVE
GLUCOSE, UR: NEGATIVE mg/dL (ref 0–75)
Hyaline Cast: 4
Ketone: NEGATIVE
Leukocyte Esterase: NEGATIVE
Nitrite: NEGATIVE
Ph: 5 (ref 4.5–8.0)
Protein: 100
RBC,UR: <1 /HPF (ref 0–5)
SPECIFIC GRAVITY: 1.012 (ref 1.003–1.030)
Squamous Epithelial: NONE SEEN
WBC UR: <1 /HPF (ref 0–5)

## 2014-01-08 LAB — COMPREHENSIVE METABOLIC PANEL
ALT: 38 U/L (ref 12–78)
AST: 47 U/L — AB (ref 15–37)
Albumin: 3 g/dL — ABNORMAL LOW (ref 3.4–5.0)
Alkaline Phosphatase: 93 U/L
Anion Gap: 7 (ref 7–16)
BUN: 37 mg/dL — ABNORMAL HIGH (ref 7–18)
Bilirubin,Total: 0.2 mg/dL (ref 0.2–1.0)
CHLORIDE: 106 mmol/L (ref 98–107)
Calcium, Total: 8.7 mg/dL (ref 8.5–10.1)
Co2: 22 mmol/L (ref 21–32)
Creatinine: 2.76 mg/dL — ABNORMAL HIGH (ref 0.60–1.30)
EGFR (African American): 25 — ABNORMAL LOW
EGFR (Non-African Amer.): 21 — ABNORMAL LOW
GLUCOSE: 129 mg/dL — AB (ref 65–99)
Osmolality: 280 (ref 275–301)
Potassium: 5.9 mmol/L — ABNORMAL HIGH (ref 3.5–5.1)
SODIUM: 135 mmol/L — AB (ref 136–145)
Total Protein: 7 g/dL (ref 6.4–8.2)

## 2014-01-08 LAB — CBC
HCT: 24.4 % — ABNORMAL LOW (ref 40.0–52.0)
HGB: 8.1 g/dL — ABNORMAL LOW (ref 13.0–18.0)
MCH: 28.3 pg (ref 26.0–34.0)
MCHC: 33.2 g/dL (ref 32.0–36.0)
MCV: 86 fL (ref 80–100)
Platelet: 350 10*3/uL (ref 150–440)
RBC: 2.85 10*6/uL — ABNORMAL LOW (ref 4.40–5.90)
RDW: 17.9 % — ABNORMAL HIGH (ref 11.5–14.5)
WBC: 7.9 10*3/uL (ref 3.8–10.6)

## 2014-01-08 LAB — TROPONIN I

## 2014-01-09 LAB — CBC WITH DIFFERENTIAL/PLATELET
Basophil #: 0.1 10*3/uL (ref 0.0–0.1)
Basophil %: 1.1 %
EOS ABS: 0.4 10*3/uL (ref 0.0–0.7)
EOS PCT: 5.1 %
HCT: 22.4 % — AB (ref 40.0–52.0)
HGB: 7.7 g/dL — ABNORMAL LOW (ref 13.0–18.0)
LYMPHS ABS: 2.2 10*3/uL (ref 1.0–3.6)
Lymphocyte %: 30.4 %
MCH: 28.5 pg (ref 26.0–34.0)
MCHC: 34.1 g/dL (ref 32.0–36.0)
MCV: 83 fL (ref 80–100)
MONOS PCT: 13.5 %
Monocyte #: 1 x10 3/mm (ref 0.2–1.0)
NEUTROS PCT: 49.9 %
Neutrophil #: 3.5 10*3/uL (ref 1.4–6.5)
Platelet: 333 10*3/uL (ref 150–440)
RBC: 2.69 10*6/uL — AB (ref 4.40–5.90)
RDW: 19.8 % — ABNORMAL HIGH (ref 11.5–14.5)
WBC: 7.1 10*3/uL (ref 3.8–10.6)

## 2014-01-09 LAB — BASIC METABOLIC PANEL
Anion Gap: 7 (ref 7–16)
BUN: 36 mg/dL — AB (ref 7–18)
CHLORIDE: 110 mmol/L — AB (ref 98–107)
Calcium, Total: 8.3 mg/dL — ABNORMAL LOW (ref 8.5–10.1)
Co2: 23 mmol/L (ref 21–32)
Creatinine: 2.65 mg/dL — ABNORMAL HIGH (ref 0.60–1.30)
EGFR (African American): 26 — ABNORMAL LOW
GFR CALC NON AF AMER: 22 — AB
GLUCOSE: 128 mg/dL — AB (ref 65–99)
Osmolality: 289 (ref 275–301)
Potassium: 4.7 mmol/L (ref 3.5–5.1)
Sodium: 140 mmol/L (ref 136–145)

## 2014-01-09 LAB — MAGNESIUM: Magnesium: 1.8 mg/dL

## 2014-01-10 LAB — BASIC METABOLIC PANEL
Anion Gap: 6 — ABNORMAL LOW (ref 7–16)
BUN: 31 mg/dL — ABNORMAL HIGH (ref 7–18)
Calcium, Total: 8.7 mg/dL (ref 8.5–10.1)
Chloride: 113 mmol/L — ABNORMAL HIGH (ref 98–107)
Co2: 23 mmol/L (ref 21–32)
Creatinine: 2.46 mg/dL — ABNORMAL HIGH (ref 0.60–1.30)
EGFR (African American): 28 — ABNORMAL LOW
EGFR (Non-African Amer.): 25 — ABNORMAL LOW
Glucose: 103 mg/dL — ABNORMAL HIGH (ref 65–99)
Osmolality: 290 (ref 275–301)
Potassium: 4.5 mmol/L (ref 3.5–5.1)
Sodium: 142 mmol/L (ref 136–145)

## 2014-01-10 LAB — HEMOGLOBIN: HGB: 7.1 g/dL — ABNORMAL LOW (ref 13.0–18.0)

## 2014-01-10 LAB — URINE CULTURE

## 2014-01-11 LAB — BASIC METABOLIC PANEL
Anion Gap: 5 — ABNORMAL LOW (ref 7–16)
BUN: 30 mg/dL — ABNORMAL HIGH (ref 7–18)
Calcium, Total: 9.2 mg/dL (ref 8.5–10.1)
Chloride: 111 mmol/L — ABNORMAL HIGH (ref 98–107)
Co2: 24 mmol/L (ref 21–32)
Creatinine: 2 mg/dL — ABNORMAL HIGH (ref 0.60–1.30)
EGFR (Non-African Amer.): 31 — ABNORMAL LOW
GFR CALC AF AMER: 36 — AB
Glucose: 86 mg/dL (ref 65–99)
Osmolality: 285 (ref 275–301)
Potassium: 4.3 mmol/L (ref 3.5–5.1)
SODIUM: 140 mmol/L (ref 136–145)

## 2014-01-11 LAB — WOUND AEROBIC CULTURE

## 2014-01-11 LAB — HEMOGLOBIN: HGB: 7.7 g/dL — ABNORMAL LOW (ref 13.0–18.0)

## 2014-01-11 LAB — OCCULT BLOOD X 1 CARD TO LAB, STOOL: OCCULT BLOOD, FECES: POSITIVE

## 2014-01-12 ENCOUNTER — Inpatient Hospital Stay
Admission: AD | Admit: 2014-01-12 | Discharge: 2014-02-08 | Disposition: A | Discharge status: Skilled Nursing Facility | Payer: Medicare Other | Source: Ambulatory Visit | Attending: Internal Medicine | Admitting: Internal Medicine

## 2014-01-12 ENCOUNTER — Ambulatory Visit (HOSPITAL_COMMUNITY)
Admission: AD | Admit: 2014-01-12 | Discharge: 2014-01-12 | Disposition: A | Discharge status: Home / Self Care | Payer: Medicare Other | Source: Other Acute Inpatient Hospital | Attending: Internal Medicine | Admitting: Internal Medicine

## 2014-01-12 DIAGNOSIS — R627 Adult failure to thrive: Secondary | ICD-10-CM | POA: Diagnosis present

## 2014-01-12 LAB — BASIC METABOLIC PANEL
Anion Gap: 6 — ABNORMAL LOW (ref 7–16)
BUN: 28 mg/dL — AB (ref 7–18)
CREATININE: 1.79 mg/dL — AB (ref 0.60–1.30)
Calcium, Total: 8.9 mg/dL (ref 8.5–10.1)
Chloride: 111 mmol/L — ABNORMAL HIGH (ref 98–107)
Co2: 26 mmol/L (ref 21–32)
EGFR (Non-African Amer.): 36 — ABNORMAL LOW
GFR CALC AF AMER: 42 — AB
Glucose: 54 mg/dL — ABNORMAL LOW (ref 65–99)
OSMOLALITY: 288 (ref 275–301)
Potassium: 3.9 mmol/L (ref 3.5–5.1)
Sodium: 143 mmol/L (ref 136–145)

## 2014-01-12 LAB — HEMOGLOBIN: HGB: 7.1 g/dL — AB (ref 13.0–18.0)

## 2014-01-13 ENCOUNTER — Ambulatory Visit: Payer: Self-pay | Admitting: Oncology

## 2014-01-13 LAB — CBC WITH DIFFERENTIAL/PLATELET
Basophils Absolute: 0.1 10*3/uL (ref 0.0–0.1)
Basophils Relative: 1 % (ref 0–1)
EOS PCT: 5 % (ref 0–5)
Eosinophils Absolute: 0.3 10*3/uL (ref 0.0–0.7)
HEMATOCRIT: 25.4 % — AB (ref 39.0–52.0)
HEMOGLOBIN: 7.9 g/dL — AB (ref 13.0–17.0)
Lymphocytes Relative: 35 % (ref 12–46)
Lymphs Abs: 2.3 10*3/uL (ref 0.7–4.0)
MCH: 26.6 pg (ref 26.0–34.0)
MCHC: 31.1 g/dL (ref 30.0–36.0)
MCV: 85.5 fL (ref 78.0–100.0)
MONOS PCT: 14 % — AB (ref 3–12)
Monocytes Absolute: 0.9 10*3/uL (ref 0.1–1.0)
NEUTROS ABS: 3.1 10*3/uL (ref 1.7–7.7)
Neutrophils Relative %: 45 % (ref 43–77)
Platelets: 282 10*3/uL (ref 150–400)
RBC: 2.97 MIL/uL — ABNORMAL LOW (ref 4.22–5.81)
RDW: 25.7 % — ABNORMAL HIGH (ref 11.5–15.5)
WBC: 6.7 10*3/uL (ref 4.0–10.5)

## 2014-01-13 LAB — COMPREHENSIVE METABOLIC PANEL
ALT: 36 U/L (ref 0–53)
AST: 53 U/L — AB (ref 0–37)
Albumin: 2.9 g/dL — ABNORMAL LOW (ref 3.5–5.2)
Alkaline Phosphatase: 93 U/L (ref 39–117)
BILIRUBIN TOTAL: 0.4 mg/dL (ref 0.3–1.2)
BUN: 26 mg/dL — AB (ref 6–23)
CHLORIDE: 106 meq/L (ref 96–112)
CO2: 27 meq/L (ref 19–32)
Calcium: 9.2 mg/dL (ref 8.4–10.5)
Creatinine, Ser: 1.59 mg/dL — ABNORMAL HIGH (ref 0.50–1.35)
GFR calc Af Amer: 47 mL/min — ABNORMAL LOW (ref >90)
GFR, EST NON AFRICAN AMERICAN: 41 mL/min — AB (ref >90)
Glucose, Bld: 116 mg/dL — ABNORMAL HIGH (ref 70–99)
Potassium: 4.1 mEq/L (ref 3.7–5.3)
Sodium: 144 mEq/L (ref 137–147)
Total Protein: 6.8 g/dL (ref 6.0–8.3)

## 2014-01-13 LAB — PHOSPHORUS: Phosphorus: 3.6 mg/dL (ref 2.3–4.6)

## 2014-01-13 LAB — VITAMIN B12: Vitamin B-12: 456 pg/mL (ref 211–911)

## 2014-01-13 LAB — TSH: TSH: 5.32 u[IU]/mL — ABNORMAL HIGH (ref 0.350–4.500)

## 2014-01-13 LAB — PREALBUMIN: Prealbumin: 13.9 mg/dL — ABNORMAL LOW (ref 17.0–34.0)

## 2014-01-13 LAB — MAGNESIUM: Magnesium: 1.7 mg/dL (ref 1.5–2.5)

## 2014-01-13 LAB — T4, FREE: FREE T4: 0.92 ng/dL (ref 0.80–1.80)

## 2014-01-14 LAB — C-REACTIVE PROTEIN: CRP: 1.4 mg/dL — ABNORMAL HIGH (ref <0.60)

## 2014-01-14 LAB — PREALBUMIN: Prealbumin: 12.3 mg/dL — ABNORMAL LOW (ref 17.0–34.0)

## 2014-01-14 LAB — SEDIMENTATION RATE: Sed Rate: 17 mm/hr — ABNORMAL HIGH (ref 0–16)

## 2014-01-14 LAB — ALBUMIN: ALBUMIN: 2.7 g/dL — AB (ref 3.5–5.2)

## 2014-01-14 LAB — FOLATE RBC: RBC FOLATE: 1512 ng/mL — AB (ref >280)

## 2014-01-15 ENCOUNTER — Encounter (HOSPITAL_COMMUNITY): Payer: Self-pay | Admitting: Radiology

## 2014-01-15 LAB — WOUND AEROBIC CULTURE

## 2014-01-15 LAB — BASIC METABOLIC PANEL
ANION GAP: 10 (ref 5–15)
BUN: 31 mg/dL — ABNORMAL HIGH (ref 6–23)
CALCIUM: 8.6 mg/dL (ref 8.4–10.5)
CO2: 27 mEq/L (ref 19–32)
Chloride: 107 mEq/L (ref 96–112)
Creatinine, Ser: 1.5 mg/dL — ABNORMAL HIGH (ref 0.50–1.35)
GFR calc Af Amer: 50 mL/min — ABNORMAL LOW (ref >90)
GFR, EST NON AFRICAN AMERICAN: 43 mL/min — AB (ref >90)
Glucose, Bld: 114 mg/dL — ABNORMAL HIGH (ref 70–99)
Potassium: 4.2 mEq/L (ref 3.7–5.3)
Sodium: 144 mEq/L (ref 137–147)

## 2014-01-15 LAB — CBC
HCT: 24.2 % — ABNORMAL LOW (ref 39.0–52.0)
Hemoglobin: 7.5 g/dL — ABNORMAL LOW (ref 13.0–17.0)
MCH: 26.7 pg (ref 26.0–34.0)
MCHC: 31 g/dL (ref 30.0–36.0)
MCV: 86.1 fL (ref 78.0–100.0)
PLATELETS: 390 10*3/uL (ref 150–400)
RBC: 2.81 MIL/uL — ABNORMAL LOW (ref 4.22–5.81)
RDW: 24.8 % — ABNORMAL HIGH (ref 11.5–15.5)
WBC: 6.5 10*3/uL (ref 4.0–10.5)

## 2014-01-17 LAB — CBC WITH DIFFERENTIAL/PLATELET
BASOS ABS: 0.1 10*3/uL (ref 0.0–0.1)
Basophils Relative: 1 % (ref 0–1)
EOS ABS: 0.3 10*3/uL (ref 0.0–0.7)
Eosinophils Relative: 4 % (ref 0–5)
HCT: 26.8 % — ABNORMAL LOW (ref 39.0–52.0)
Hemoglobin: 8.3 g/dL — ABNORMAL LOW (ref 13.0–17.0)
LYMPHS ABS: 2.2 10*3/uL (ref 0.7–4.0)
LYMPHS PCT: 31 % (ref 12–46)
MCH: 26.9 pg (ref 26.0–34.0)
MCHC: 31 g/dL (ref 30.0–36.0)
MCV: 86.7 fL (ref 78.0–100.0)
Monocytes Absolute: 0.9 10*3/uL (ref 0.1–1.0)
Monocytes Relative: 12 % (ref 3–12)
Neutro Abs: 3.6 10*3/uL (ref 1.7–7.7)
Neutrophils Relative %: 52 % (ref 43–77)
Platelets: 271 10*3/uL (ref 150–400)
RBC: 3.09 MIL/uL — ABNORMAL LOW (ref 4.22–5.81)
RDW: 25.3 % — AB (ref 11.5–15.5)
WBC: 7.1 10*3/uL (ref 4.0–10.5)

## 2014-01-17 LAB — BASIC METABOLIC PANEL WITH GFR
Anion gap: 13 (ref 5–15)
BUN: 34 mg/dL — ABNORMAL HIGH (ref 6–23)
CO2: 25 meq/L (ref 19–32)
Calcium: 9.3 mg/dL (ref 8.4–10.5)
Chloride: 107 meq/L (ref 96–112)
Creatinine, Ser: 1.49 mg/dL — ABNORMAL HIGH (ref 0.50–1.35)
GFR calc Af Amer: 51 mL/min — ABNORMAL LOW
GFR calc non Af Amer: 44 mL/min — ABNORMAL LOW
Glucose, Bld: 83 mg/dL (ref 70–99)
Potassium: 4.3 meq/L (ref 3.7–5.3)
Sodium: 145 meq/L (ref 137–147)

## 2014-01-17 LAB — SEDIMENTATION RATE: Sed Rate: 19 mm/hr — ABNORMAL HIGH (ref 0–16)

## 2014-01-17 LAB — C-REACTIVE PROTEIN: CRP: 0.7 mg/dL — ABNORMAL HIGH

## 2014-01-18 DIAGNOSIS — I739 Peripheral vascular disease, unspecified: Secondary | ICD-10-CM

## 2014-01-18 DIAGNOSIS — M79609 Pain in unspecified limb: Secondary | ICD-10-CM | POA: Diagnosis not present
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
## 2014-01-20 DIAGNOSIS — I739 Peripheral vascular disease, unspecified: Secondary | ICD-10-CM

## 2014-01-20 DIAGNOSIS — L98499 Non-pressure chronic ulcer of skin of other sites with unspecified severity: Secondary | ICD-10-CM

## 2014-01-20 LAB — BASIC METABOLIC PANEL
Anion gap: 15 (ref 5–15)
BUN: 34 mg/dL — AB (ref 6–23)
CO2: 26 mEq/L (ref 19–32)
CREATININE: 1.55 mg/dL — AB (ref 0.50–1.35)
Calcium: 9.5 mg/dL (ref 8.4–10.5)
Chloride: 106 mEq/L (ref 96–112)
GFR calc Af Amer: 48 mL/min — ABNORMAL LOW (ref >90)
GFR, EST NON AFRICAN AMERICAN: 42 mL/min — AB (ref >90)
Glucose, Bld: 104 mg/dL — ABNORMAL HIGH (ref 70–99)
Potassium: 4.3 mEq/L (ref 3.7–5.3)
Sodium: 147 mEq/L (ref 137–147)

## 2014-01-20 LAB — CBC WITH DIFFERENTIAL/PLATELET
Basophils Absolute: 0.1 10*3/uL (ref 0.0–0.1)
Basophils Relative: 1 % (ref 0–1)
Eosinophils Absolute: 0.4 10*3/uL (ref 0.0–0.7)
Eosinophils Relative: 5 % (ref 0–5)
HEMATOCRIT: 31.8 % — AB (ref 39.0–52.0)
Hemoglobin: 9.7 g/dL — ABNORMAL LOW (ref 13.0–17.0)
LYMPHS ABS: 2.6 10*3/uL (ref 0.7–4.0)
Lymphocytes Relative: 34 % (ref 12–46)
MCH: 26.6 pg (ref 26.0–34.0)
MCHC: 30.5 g/dL (ref 30.0–36.0)
MCV: 87.4 fL (ref 78.0–100.0)
Monocytes Absolute: 0.8 10*3/uL (ref 0.1–1.0)
Monocytes Relative: 11 % (ref 3–12)
NEUTROS ABS: 3.8 10*3/uL (ref 1.7–7.7)
Neutrophils Relative %: 49 % (ref 43–77)
Platelets: 314 10*3/uL (ref 150–400)
RBC: 3.64 MIL/uL — ABNORMAL LOW (ref 4.22–5.81)
RDW: 24.7 % — ABNORMAL HIGH (ref 11.5–15.5)
WBC: 7.7 10*3/uL (ref 4.0–10.5)

## 2014-01-20 NOTE — Progress Notes (Signed)
VASCULAR LAB PRELIMINARY  ARTERIAL  ABI completed:    RIGHT    LEFT    PRESSURE WAVEFORM  PRESSURE WAVEFORM  BRACHIAL 178 Triphasic BRACHIAL 151 Triphasic  DP 136 Monophasic DP 135 Dampened Monophasic  PT 87 Dampened Monophasic PT 95 Dampened Monophasic    RIGHT LEFT  ABI PT 0.76   DP 0.49 PT 0.76  DP 0.53   ABIs in the posterior tibial artery bilaterally indicate a mild reduction in arterial flow. ABI of the right dorsalis pedis indicates a severe reduction in arterial flow. ABI of the left dorsalis pedis indicates a moderate to severe reduction in arterial flow. Abnormal Doppler waveforms throughout would suggest a false elevation of pressures probably due to calcification and would adversely effect the results yielding higher results than actual.   Alan Mcintyre, RVS 01/20/2014, 4:18 PM

## 2014-01-25 LAB — CBC WITH DIFFERENTIAL/PLATELET
Basophils Absolute: 0 10*3/uL (ref 0.0–0.1)
Basophils Relative: 0 % (ref 0–1)
Eosinophils Absolute: 0.4 10*3/uL (ref 0.0–0.7)
Eosinophils Relative: 5 % (ref 0–5)
HEMATOCRIT: 31.4 % — AB (ref 39.0–52.0)
HEMOGLOBIN: 9.9 g/dL — AB (ref 13.0–17.0)
Lymphocytes Relative: 34 % (ref 12–46)
Lymphs Abs: 2.5 10*3/uL (ref 0.7–4.0)
MCH: 27.3 pg (ref 26.0–34.0)
MCHC: 31.5 g/dL (ref 30.0–36.0)
MCV: 86.7 fL (ref 78.0–100.0)
Monocytes Absolute: 0.9 10*3/uL (ref 0.1–1.0)
Monocytes Relative: 12 % (ref 3–12)
NEUTROS ABS: 3.5 10*3/uL (ref 1.7–7.7)
Neutrophils Relative %: 49 % (ref 43–77)
Platelets: 274 10*3/uL (ref 150–400)
RBC: 3.62 MIL/uL — AB (ref 4.22–5.81)
RDW: 22.4 % — ABNORMAL HIGH (ref 11.5–15.5)
WBC: 7.3 10*3/uL (ref 4.0–10.5)

## 2014-01-25 LAB — COMPREHENSIVE METABOLIC PANEL
ALBUMIN: 2.9 g/dL — AB (ref 3.5–5.2)
ALK PHOS: 91 U/L (ref 39–117)
ALT: 35 U/L (ref 0–53)
ANION GAP: 16 — AB (ref 5–15)
AST: 47 U/L — ABNORMAL HIGH (ref 0–37)
BUN: 48 mg/dL — AB (ref 6–23)
CHLORIDE: 106 meq/L (ref 96–112)
CO2: 23 mEq/L (ref 19–32)
Calcium: 9 mg/dL (ref 8.4–10.5)
Creatinine, Ser: 1.94 mg/dL — ABNORMAL HIGH (ref 0.50–1.35)
GFR calc Af Amer: 37 mL/min — ABNORMAL LOW (ref >90)
GFR calc non Af Amer: 32 mL/min — ABNORMAL LOW (ref >90)
Glucose, Bld: 78 mg/dL (ref 70–99)
POTASSIUM: 4.3 meq/L (ref 3.7–5.3)
SODIUM: 145 meq/L (ref 137–147)
Total Bilirubin: 0.3 mg/dL (ref 0.3–1.2)
Total Protein: 6.7 g/dL (ref 6.0–8.3)

## 2014-01-25 LAB — PREALBUMIN: Prealbumin: 17.2 mg/dL — ABNORMAL LOW (ref 17.0–34.0)

## 2014-01-26 LAB — BASIC METABOLIC PANEL
Anion gap: 14 (ref 5–15)
BUN: 43 mg/dL — AB (ref 6–23)
CHLORIDE: 108 meq/L (ref 96–112)
CO2: 24 mEq/L (ref 19–32)
Calcium: 9.4 mg/dL (ref 8.4–10.5)
Creatinine, Ser: 1.8 mg/dL — ABNORMAL HIGH (ref 0.50–1.35)
GFR calc Af Amer: 40 mL/min — ABNORMAL LOW (ref >90)
GFR calc non Af Amer: 35 mL/min — ABNORMAL LOW (ref >90)
GLUCOSE: 117 mg/dL — AB (ref 70–99)
Potassium: 4.6 mEq/L (ref 3.7–5.3)
Sodium: 146 mEq/L (ref 137–147)

## 2014-01-29 LAB — BASIC METABOLIC PANEL
Anion gap: 13 (ref 5–15)
BUN: 40 mg/dL — AB (ref 6–23)
CALCIUM: 9.3 mg/dL (ref 8.4–10.5)
CO2: 25 meq/L (ref 19–32)
CREATININE: 1.82 mg/dL — AB (ref 0.50–1.35)
Chloride: 110 mEq/L (ref 96–112)
GFR calc Af Amer: 40 mL/min — ABNORMAL LOW (ref >90)
GFR, EST NON AFRICAN AMERICAN: 34 mL/min — AB (ref >90)
GLUCOSE: 73 mg/dL (ref 70–99)
Potassium: 4.7 mEq/L (ref 3.7–5.3)
Sodium: 148 mEq/L — ABNORMAL HIGH (ref 137–147)

## 2014-01-29 LAB — CBC
HCT: 35.1 % — ABNORMAL LOW (ref 39.0–52.0)
HEMOGLOBIN: 10.9 g/dL — AB (ref 13.0–17.0)
MCH: 27.1 pg (ref 26.0–34.0)
MCHC: 31.1 g/dL (ref 30.0–36.0)
MCV: 87.3 fL (ref 78.0–100.0)
Platelets: 291 10*3/uL (ref 150–400)
RBC: 4.02 MIL/uL — AB (ref 4.22–5.81)
RDW: 21.5 % — ABNORMAL HIGH (ref 11.5–15.5)
WBC: 8 10*3/uL (ref 4.0–10.5)

## 2014-01-30 LAB — BASIC METABOLIC PANEL
Anion gap: 13 (ref 5–15)
BUN: 40 mg/dL — AB (ref 6–23)
CALCIUM: 9.1 mg/dL (ref 8.4–10.5)
CO2: 25 meq/L (ref 19–32)
CREATININE: 1.59 mg/dL — AB (ref 0.50–1.35)
Chloride: 105 mEq/L (ref 96–112)
GFR calc Af Amer: 47 mL/min — ABNORMAL LOW (ref >90)
GFR, EST NON AFRICAN AMERICAN: 41 mL/min — AB (ref >90)
GLUCOSE: 86 mg/dL (ref 70–99)
Potassium: 4.4 mEq/L (ref 3.7–5.3)
Sodium: 143 mEq/L (ref 137–147)

## 2014-01-30 IMAGING — CR DG CHEST 1V PORT
1 series · 1 of 1 positions shown · non-contrast
Comparison: 05/05/2013

CLINICAL DATA: Swelling lower extremities.  Assess for edema.

EXAM:
PORTABLE CHEST - 1 VIEW

[ap]
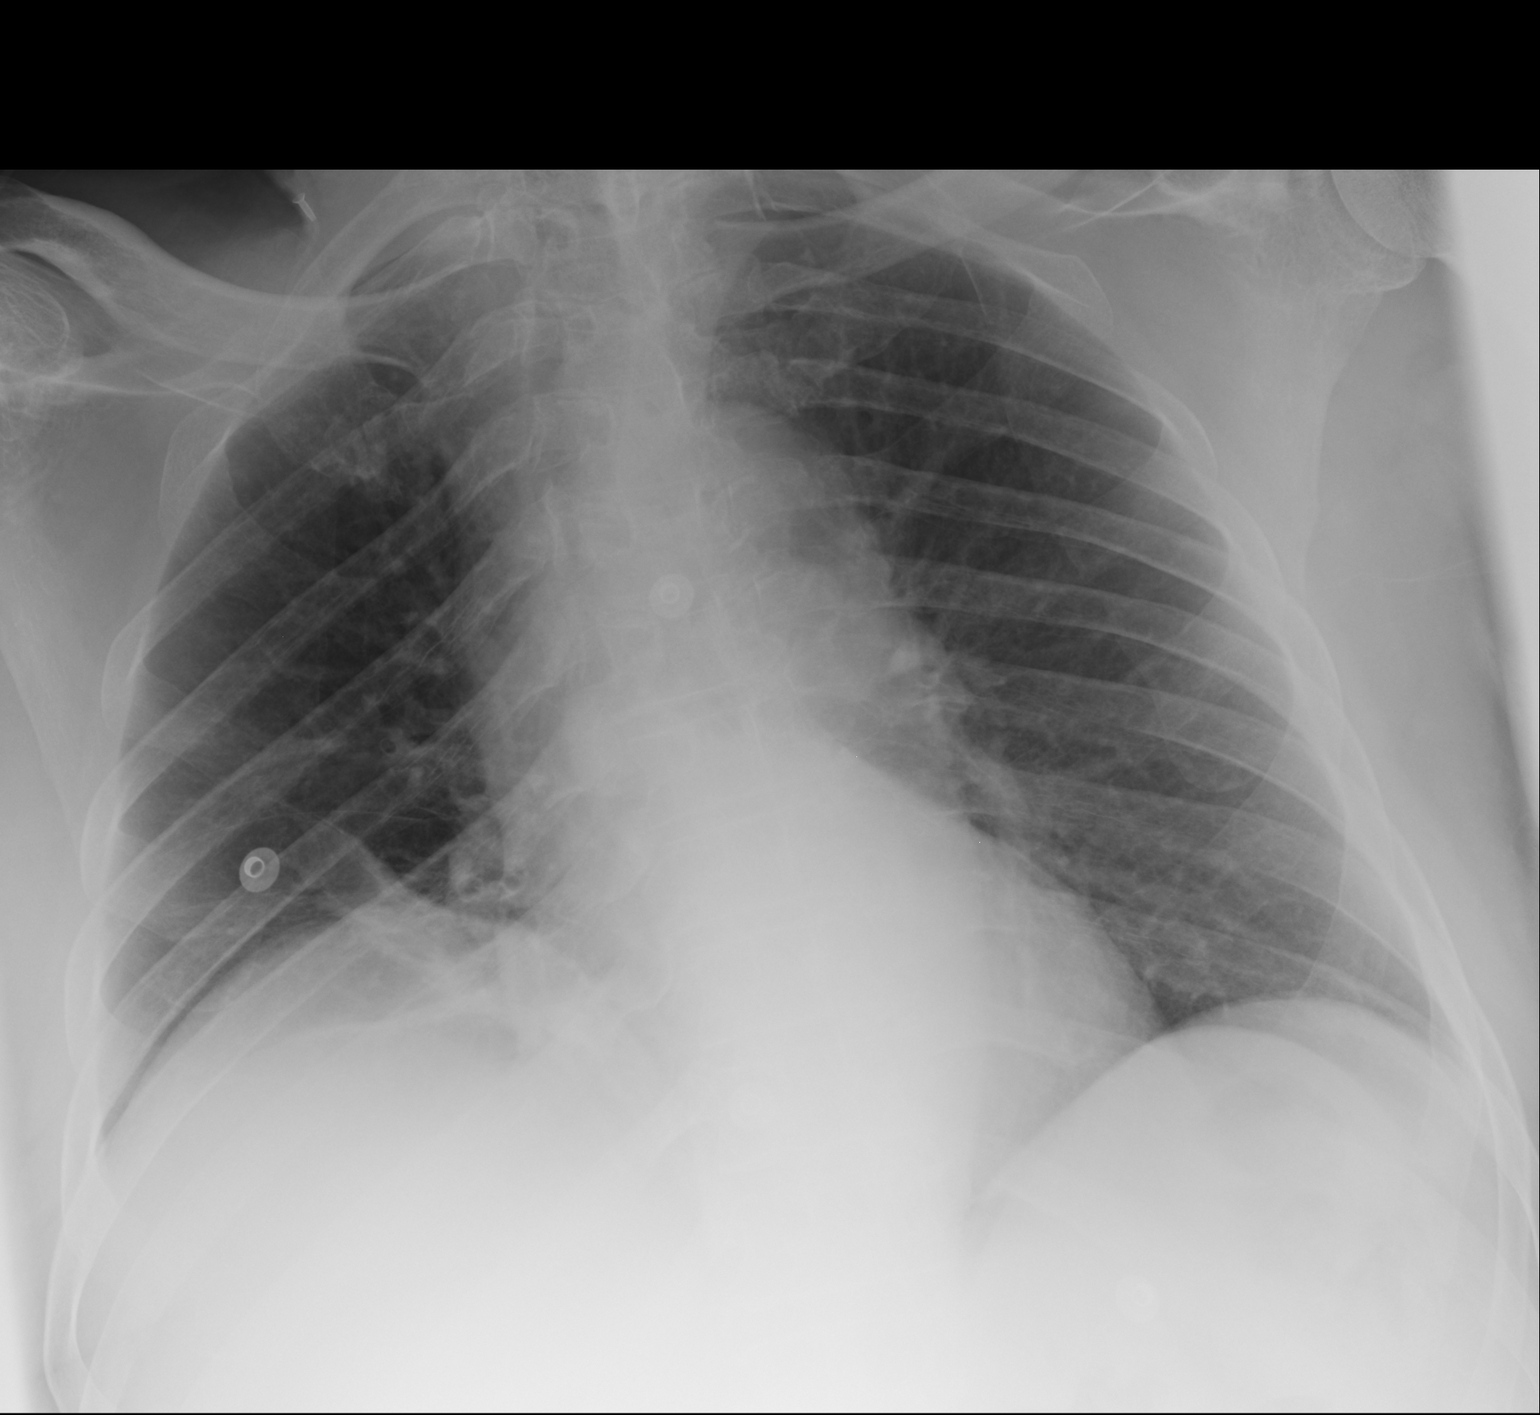

[1 of 1 positions shown; findings below may reference images not displayed]

FINDINGS: The heart is mildly enlarged. There is elevation of the right
hemidiaphragm. Streaky/patchy density identified at the right lung
base, similar in appearance to prior study. Findings are consistent
with atelectasis and/or infiltrate. There has been improvement in
aeration of the left lung base. Right PICC line has been removed. No
new edema.
IMPRESSION: 1. No evidence for pulmonary edema.
2. Persistent right lower lobe atelectasis and or infiltrate.
3. Improved left lower lobe infiltrate.

## 2014-02-01 LAB — BASIC METABOLIC PANEL
Anion gap: 11 (ref 5–15)
BUN: 41 mg/dL — ABNORMAL HIGH (ref 6–23)
CO2: 26 meq/L (ref 19–32)
CREATININE: 1.74 mg/dL — AB (ref 0.50–1.35)
Calcium: 9.3 mg/dL (ref 8.4–10.5)
Chloride: 105 mEq/L (ref 96–112)
GFR calc Af Amer: 42 mL/min — ABNORMAL LOW (ref >90)
GFR calc non Af Amer: 36 mL/min — ABNORMAL LOW (ref >90)
GLUCOSE: 73 mg/dL (ref 70–99)
Potassium: 4.6 mEq/L (ref 3.7–5.3)
SODIUM: 142 meq/L (ref 137–147)

## 2014-02-01 LAB — CBC WITH DIFFERENTIAL/PLATELET
BASOS ABS: 0 10*3/uL (ref 0.0–0.1)
Basophils Relative: 1 % (ref 0–1)
EOS ABS: 0.3 10*3/uL (ref 0.0–0.7)
Eosinophils Relative: 5 % (ref 0–5)
HEMATOCRIT: 32.2 % — AB (ref 39.0–52.0)
Hemoglobin: 10.4 g/dL — ABNORMAL LOW (ref 13.0–17.0)
LYMPHS PCT: 36 % (ref 12–46)
Lymphs Abs: 2.4 10*3/uL (ref 0.7–4.0)
MCH: 27.4 pg (ref 26.0–34.0)
MCHC: 32.3 g/dL (ref 30.0–36.0)
MCV: 85 fL (ref 78.0–100.0)
MONO ABS: 0.9 10*3/uL (ref 0.1–1.0)
Monocytes Relative: 14 % — ABNORMAL HIGH (ref 3–12)
Neutro Abs: 3 10*3/uL (ref 1.7–7.7)
Neutrophils Relative %: 45 % (ref 43–77)
PLATELETS: 249 10*3/uL (ref 150–400)
RBC: 3.79 MIL/uL — ABNORMAL LOW (ref 4.22–5.81)
RDW: 20.4 % — AB (ref 11.5–15.5)
WBC: 6.5 10*3/uL (ref 4.0–10.5)

## 2014-02-01 LAB — SEDIMENTATION RATE: SED RATE: 11 mm/h (ref 0–16)

## 2014-02-01 LAB — MAGNESIUM: MAGNESIUM: 1.9 mg/dL (ref 1.5–2.5)

## 2014-02-01 LAB — PHOSPHORUS: Phosphorus: 4.2 mg/dL (ref 2.3–4.6)

## 2014-02-01 LAB — PREALBUMIN: Prealbumin: 17.3 mg/dL — ABNORMAL LOW (ref 17.0–34.0)

## 2014-02-01 LAB — C-REACTIVE PROTEIN

## 2014-02-05 LAB — CBC WITH DIFFERENTIAL/PLATELET
BASOS PCT: 0 % (ref 0–1)
Basophils Absolute: 0 10*3/uL (ref 0.0–0.1)
EOS PCT: 4 % (ref 0–5)
Eosinophils Absolute: 0.3 10*3/uL (ref 0.0–0.7)
HEMATOCRIT: 33.2 % — AB (ref 39.0–52.0)
Hemoglobin: 10.8 g/dL — ABNORMAL LOW (ref 13.0–17.0)
LYMPHS PCT: 28 % (ref 12–46)
Lymphs Abs: 2 10*3/uL (ref 0.7–4.0)
MCH: 27.8 pg (ref 26.0–34.0)
MCHC: 32.5 g/dL (ref 30.0–36.0)
MCV: 85.3 fL (ref 78.0–100.0)
MONOS PCT: 17 % — AB (ref 3–12)
Monocytes Absolute: 1.2 10*3/uL — ABNORMAL HIGH (ref 0.1–1.0)
NEUTROS ABS: 3.7 10*3/uL (ref 1.7–7.7)
NEUTROS PCT: 51 % (ref 43–77)
Platelets: 211 10*3/uL (ref 150–400)
RBC: 3.89 MIL/uL — AB (ref 4.22–5.81)
RDW: 19.4 % — ABNORMAL HIGH (ref 11.5–15.5)
WBC: 7.2 10*3/uL (ref 4.0–10.5)

## 2014-02-05 LAB — BASIC METABOLIC PANEL
Anion gap: 15 (ref 5–15)
BUN: 38 mg/dL — AB (ref 6–23)
CHLORIDE: 107 meq/L (ref 96–112)
CO2: 22 mEq/L (ref 19–32)
Calcium: 9.3 mg/dL (ref 8.4–10.5)
Creatinine, Ser: 1.82 mg/dL — ABNORMAL HIGH (ref 0.50–1.35)
GFR calc Af Amer: 40 mL/min — ABNORMAL LOW (ref >90)
GFR calc non Af Amer: 34 mL/min — ABNORMAL LOW (ref >90)
GLUCOSE: 92 mg/dL (ref 70–99)
POTASSIUM: 4.9 meq/L (ref 3.7–5.3)
SODIUM: 144 meq/L (ref 137–147)

## 2014-02-08 LAB — CBC
HCT: 32.6 % — ABNORMAL LOW (ref 39.0–52.0)
HEMOGLOBIN: 10.3 g/dL — AB (ref 13.0–17.0)
MCH: 27.4 pg (ref 26.0–34.0)
MCHC: 31.6 g/dL (ref 30.0–36.0)
MCV: 86.7 fL (ref 78.0–100.0)
PLATELETS: 233 10*3/uL (ref 150–400)
RBC: 3.76 MIL/uL — AB (ref 4.22–5.81)
RDW: 19 % — ABNORMAL HIGH (ref 11.5–15.5)
WBC: 7.2 10*3/uL (ref 4.0–10.5)

## 2014-02-08 LAB — COMPREHENSIVE METABOLIC PANEL
ALT: 54 U/L — ABNORMAL HIGH (ref 0–53)
AST: 55 U/L — AB (ref 0–37)
Albumin: 3.2 g/dL — ABNORMAL LOW (ref 3.5–5.2)
Alkaline Phosphatase: 86 U/L (ref 39–117)
Anion gap: 12 (ref 5–15)
BUN: 41 mg/dL — ABNORMAL HIGH (ref 6–23)
CHLORIDE: 108 meq/L (ref 96–112)
CO2: 27 mEq/L (ref 19–32)
CREATININE: 2.07 mg/dL — AB (ref 0.50–1.35)
Calcium: 9.4 mg/dL (ref 8.4–10.5)
GFR calc non Af Amer: 29 mL/min — ABNORMAL LOW (ref >90)
GFR, EST AFRICAN AMERICAN: 34 mL/min — AB (ref >90)
GLUCOSE: 61 mg/dL — AB (ref 70–99)
Potassium: 4.5 mEq/L (ref 3.7–5.3)
Sodium: 147 mEq/L (ref 137–147)
Total Bilirubin: 0.3 mg/dL (ref 0.3–1.2)
Total Protein: 6.8 g/dL (ref 6.0–8.3)

## 2014-02-08 LAB — PREALBUMIN: Prealbumin: 18 mg/dL (ref 17.0–34.0)

## 2014-02-09 ENCOUNTER — Non-Acute Institutional Stay (SKILLED_NURSING_FACILITY): Payer: Medicare Other | Admitting: Internal Medicine

## 2014-02-09 DIAGNOSIS — D631 Anemia in chronic kidney disease: Secondary | ICD-10-CM

## 2014-02-09 DIAGNOSIS — E1149 Type 2 diabetes mellitus with other diabetic neurological complication: Secondary | ICD-10-CM

## 2014-02-09 DIAGNOSIS — J439 Emphysema, unspecified: Secondary | ICD-10-CM

## 2014-02-09 DIAGNOSIS — L89150 Pressure ulcer of sacral region, unstageable: Secondary | ICD-10-CM

## 2014-02-09 DIAGNOSIS — L89609 Pressure ulcer of unspecified heel, unspecified stage: Secondary | ICD-10-CM

## 2014-02-09 DIAGNOSIS — I10 Essential (primary) hypertension: Secondary | ICD-10-CM

## 2014-02-09 DIAGNOSIS — L89109 Pressure ulcer of unspecified part of back, unspecified stage: Secondary | ICD-10-CM

## 2014-02-09 DIAGNOSIS — N039 Chronic nephritic syndrome with unspecified morphologic changes: Secondary | ICD-10-CM

## 2014-02-09 DIAGNOSIS — I739 Peripheral vascular disease, unspecified: Secondary | ICD-10-CM

## 2014-02-09 DIAGNOSIS — L89602 Pressure ulcer of unspecified heel, stage 2: Secondary | ICD-10-CM

## 2014-02-09 DIAGNOSIS — L8992 Pressure ulcer of unspecified site, stage 2: Secondary | ICD-10-CM

## 2014-02-09 DIAGNOSIS — L89603 Pressure ulcer of unspecified heel, stage 3: Secondary | ICD-10-CM

## 2014-02-09 DIAGNOSIS — E1142 Type 2 diabetes mellitus with diabetic polyneuropathy: Secondary | ICD-10-CM

## 2014-02-09 DIAGNOSIS — E1140 Type 2 diabetes mellitus with diabetic neuropathy, unspecified: Secondary | ICD-10-CM

## 2014-02-09 DIAGNOSIS — E119 Type 2 diabetes mellitus without complications: Secondary | ICD-10-CM

## 2014-02-09 DIAGNOSIS — L8993 Pressure ulcer of unspecified site, stage 3: Secondary | ICD-10-CM

## 2014-02-09 DIAGNOSIS — J438 Other emphysema: Secondary | ICD-10-CM

## 2014-02-09 DIAGNOSIS — N189 Chronic kidney disease, unspecified: Secondary | ICD-10-CM

## 2014-02-09 DIAGNOSIS — K3184 Gastroparesis: Secondary | ICD-10-CM

## 2014-02-09 DIAGNOSIS — L8995 Pressure ulcer of unspecified site, unstageable: Secondary | ICD-10-CM

## 2014-02-09 DIAGNOSIS — E1143 Type 2 diabetes mellitus with diabetic autonomic (poly)neuropathy: Secondary | ICD-10-CM

## 2014-02-13 ENCOUNTER — Encounter: Payer: Self-pay | Admitting: Internal Medicine

## 2014-02-13 DIAGNOSIS — L89602 Pressure ulcer of unspecified heel, stage 2: Secondary | ICD-10-CM | POA: Insufficient documentation

## 2014-02-13 DIAGNOSIS — E1140 Type 2 diabetes mellitus with diabetic neuropathy, unspecified: Secondary | ICD-10-CM | POA: Insufficient documentation

## 2014-02-13 DIAGNOSIS — D649 Anemia, unspecified: Secondary | ICD-10-CM | POA: Insufficient documentation

## 2014-02-13 DIAGNOSIS — N183 Chronic kidney disease, stage 3 unspecified: Secondary | ICD-10-CM | POA: Insufficient documentation

## 2014-02-13 DIAGNOSIS — L89150 Pressure ulcer of sacral region, unstageable: Secondary | ICD-10-CM | POA: Insufficient documentation

## 2014-02-13 DIAGNOSIS — J449 Chronic obstructive pulmonary disease, unspecified: Secondary | ICD-10-CM | POA: Insufficient documentation

## 2014-02-13 DIAGNOSIS — F0390 Unspecified dementia without behavioral disturbance: Secondary | ICD-10-CM | POA: Insufficient documentation

## 2014-02-13 DIAGNOSIS — E1143 Type 2 diabetes mellitus with diabetic autonomic (poly)neuropathy: Secondary | ICD-10-CM | POA: Insufficient documentation

## 2014-02-13 DIAGNOSIS — E119 Type 2 diabetes mellitus without complications: Secondary | ICD-10-CM | POA: Insufficient documentation

## 2014-02-13 DIAGNOSIS — I739 Peripheral vascular disease, unspecified: Secondary | ICD-10-CM | POA: Insufficient documentation

## 2014-02-13 DIAGNOSIS — K3184 Gastroparesis: Secondary | ICD-10-CM

## 2014-02-13 DIAGNOSIS — L89603 Pressure ulcer of unspecified heel, stage 3: Secondary | ICD-10-CM | POA: Insufficient documentation

## 2014-02-13 DIAGNOSIS — I10 Essential (primary) hypertension: Secondary | ICD-10-CM | POA: Insufficient documentation

## 2014-02-13 NOTE — Assessment & Plan Note (Signed)
Bilateral;pt on ASA,plavix and Bblocker

## 2014-02-13 NOTE — Assessment & Plan Note (Addendum)
Baseline Cr reported 1.7; GFR 34 AA

## 2014-02-13 NOTE — Assessment & Plan Note (Signed)
stable °

## 2014-02-13 NOTE — Assessment & Plan Note (Signed)
Continue neurontin BID
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
## 2014-02-13 NOTE — Assessment & Plan Note (Signed)
Wound care.

## 2014-02-13 NOTE — Assessment & Plan Note (Signed)
Stable at 10.8

## 2014-02-13 NOTE — Assessment & Plan Note (Signed)
Levemir 28 u nightly ;last available 04/2013  A1c 6.0

## 2014-02-13 NOTE — Assessment & Plan Note (Signed)
Cont norvasc, hydralazine , lopressor

## 2014-02-13 NOTE — Assessment & Plan Note (Addendum)
s/p meripenum for osteo of heel; wound care

## 2014-02-13 NOTE — Assessment & Plan Note (Signed)
reglan scheduled

## 2014-02-19 IMAGING — CR DG ABDOMEN 3V
1 series · 3 of 3 positions shown · non-contrast
Comparison: Abdominal series 04/28/2013.

CLINICAL DATA: Fever and hypertension.  Pain.

EXAM:
ACUTE ABDOMEN SERIES (2 VIEW ABDOMEN AND 1 VIEW CHEST)

[Series 1: ap · 0.17mm/px · 3 of 3 slices shown]
[im 1/3]
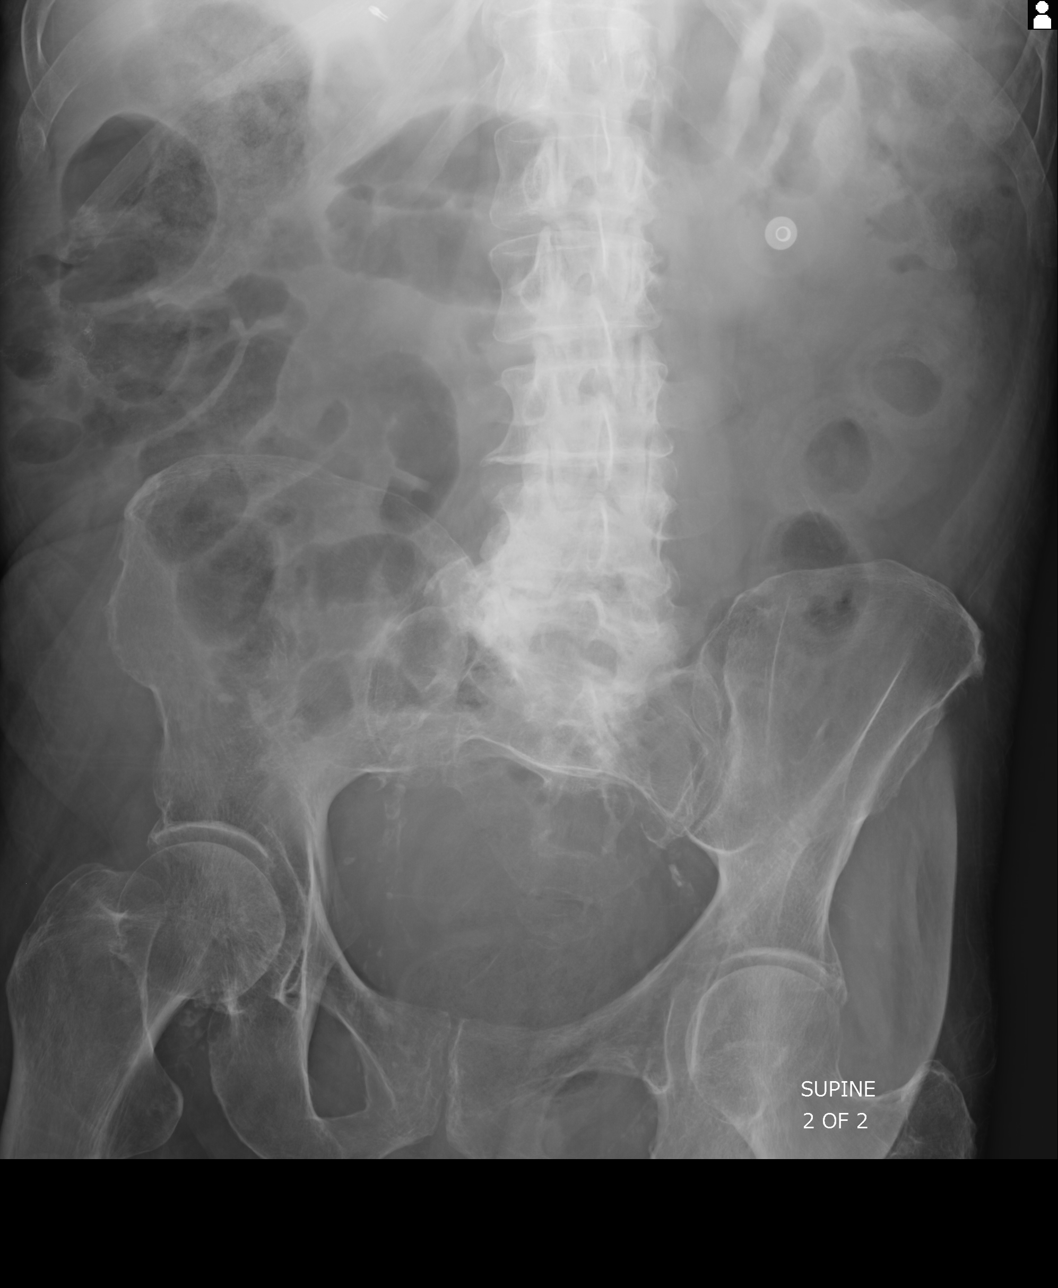
[im 2/3]
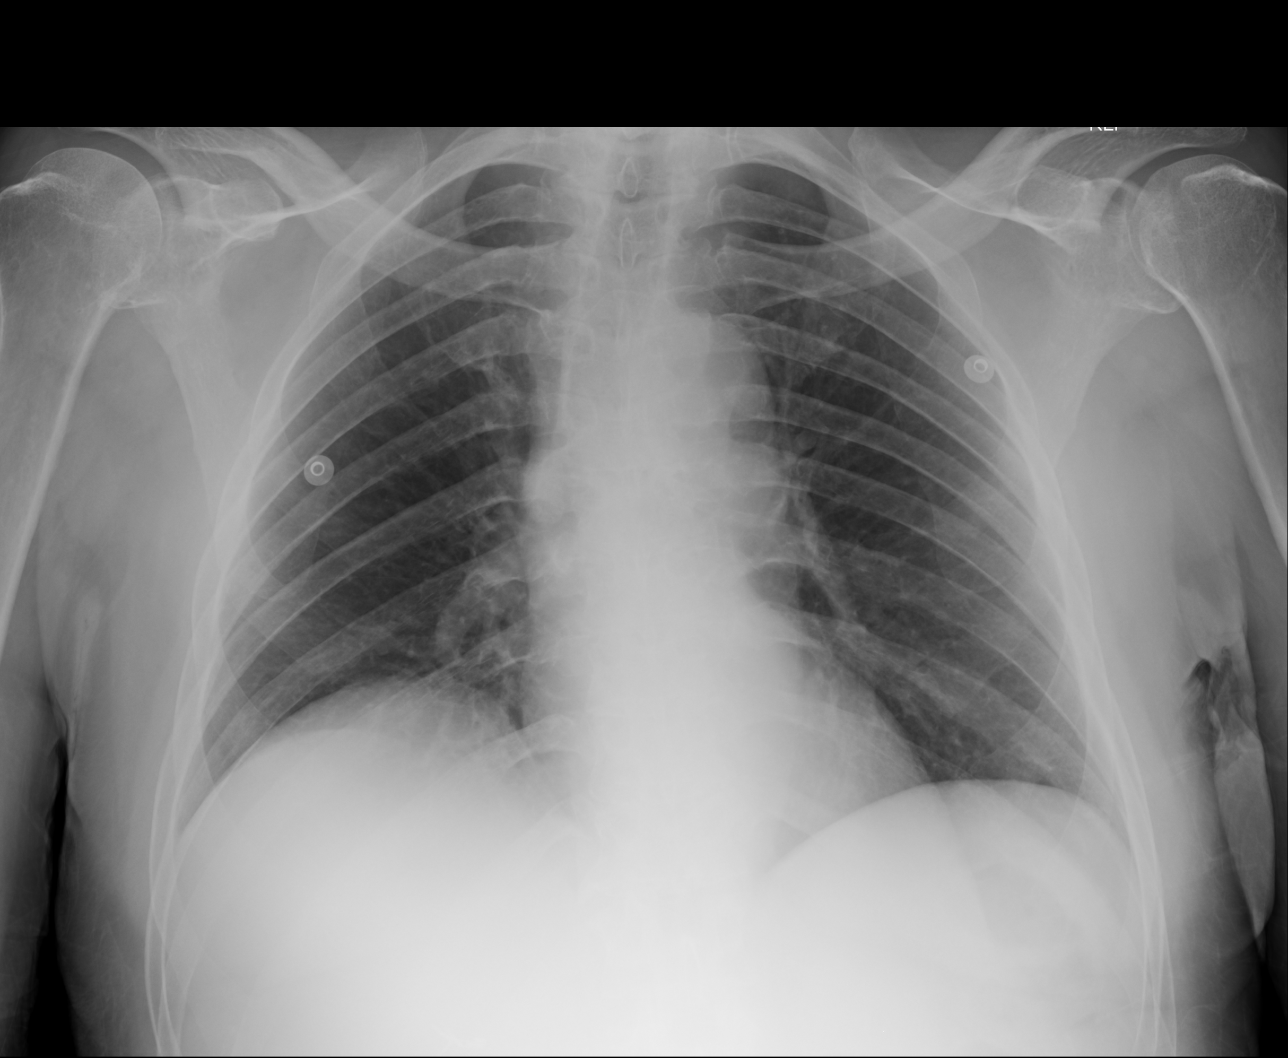
[im 3/3]
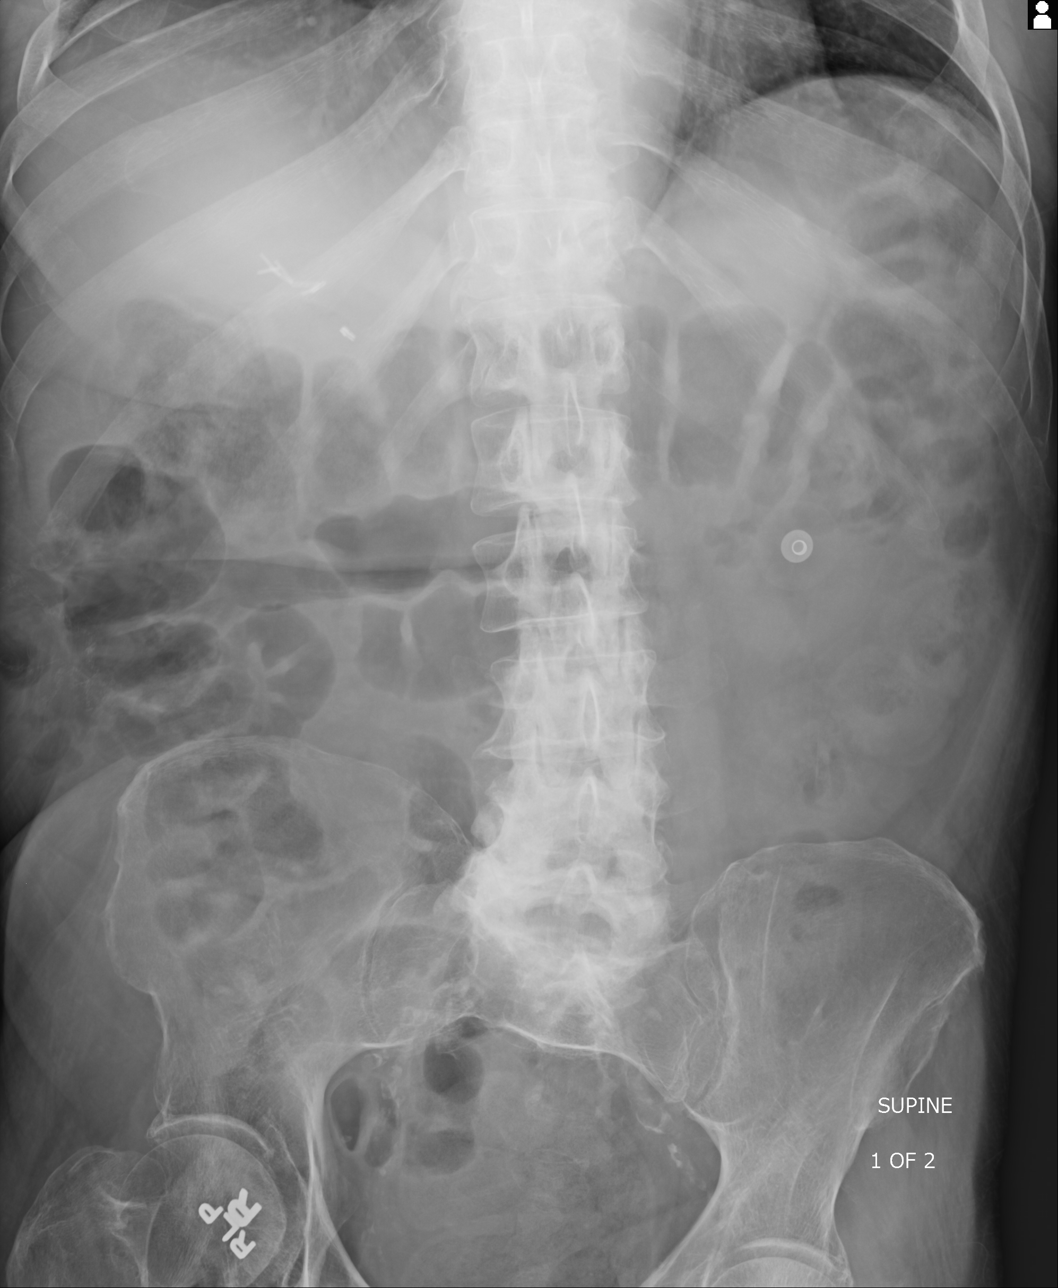

[3 of 3 positions shown; findings below may reference images not displayed]

FINDINGS: Mediastinum and hilar structures normal. The lungs are clear. No
pleural effusion or pneumothorax. Heart size and pulmonary
vascularity normal.

Surgical clips right upper quadrant consistent with prior
cholecystectomy. Chain sutures are noted in the right upper abdomen.
Soft tissue structures are unremarkable. Aortoiliac atherosclerotic
vascular calcification present. Gas pattern is nonspecific. No free
air is noted. Previously identified small bowel distention noted on
prior abdominal series of 04/28/2013 is no longer present.
Degenerative changes noted lumbar spine and both hips.
IMPRESSION: Nonspecific exam.  No focal abnormality identified.

## 2014-02-20 IMAGING — CR DG CHEST 1V PORT
1 series · 1 of 1 positions shown · non-contrast
Comparison: 05/30/2013

CLINICAL DATA: Sepsis.

EXAM:
PORTABLE CHEST - 1 VIEW

[ap]
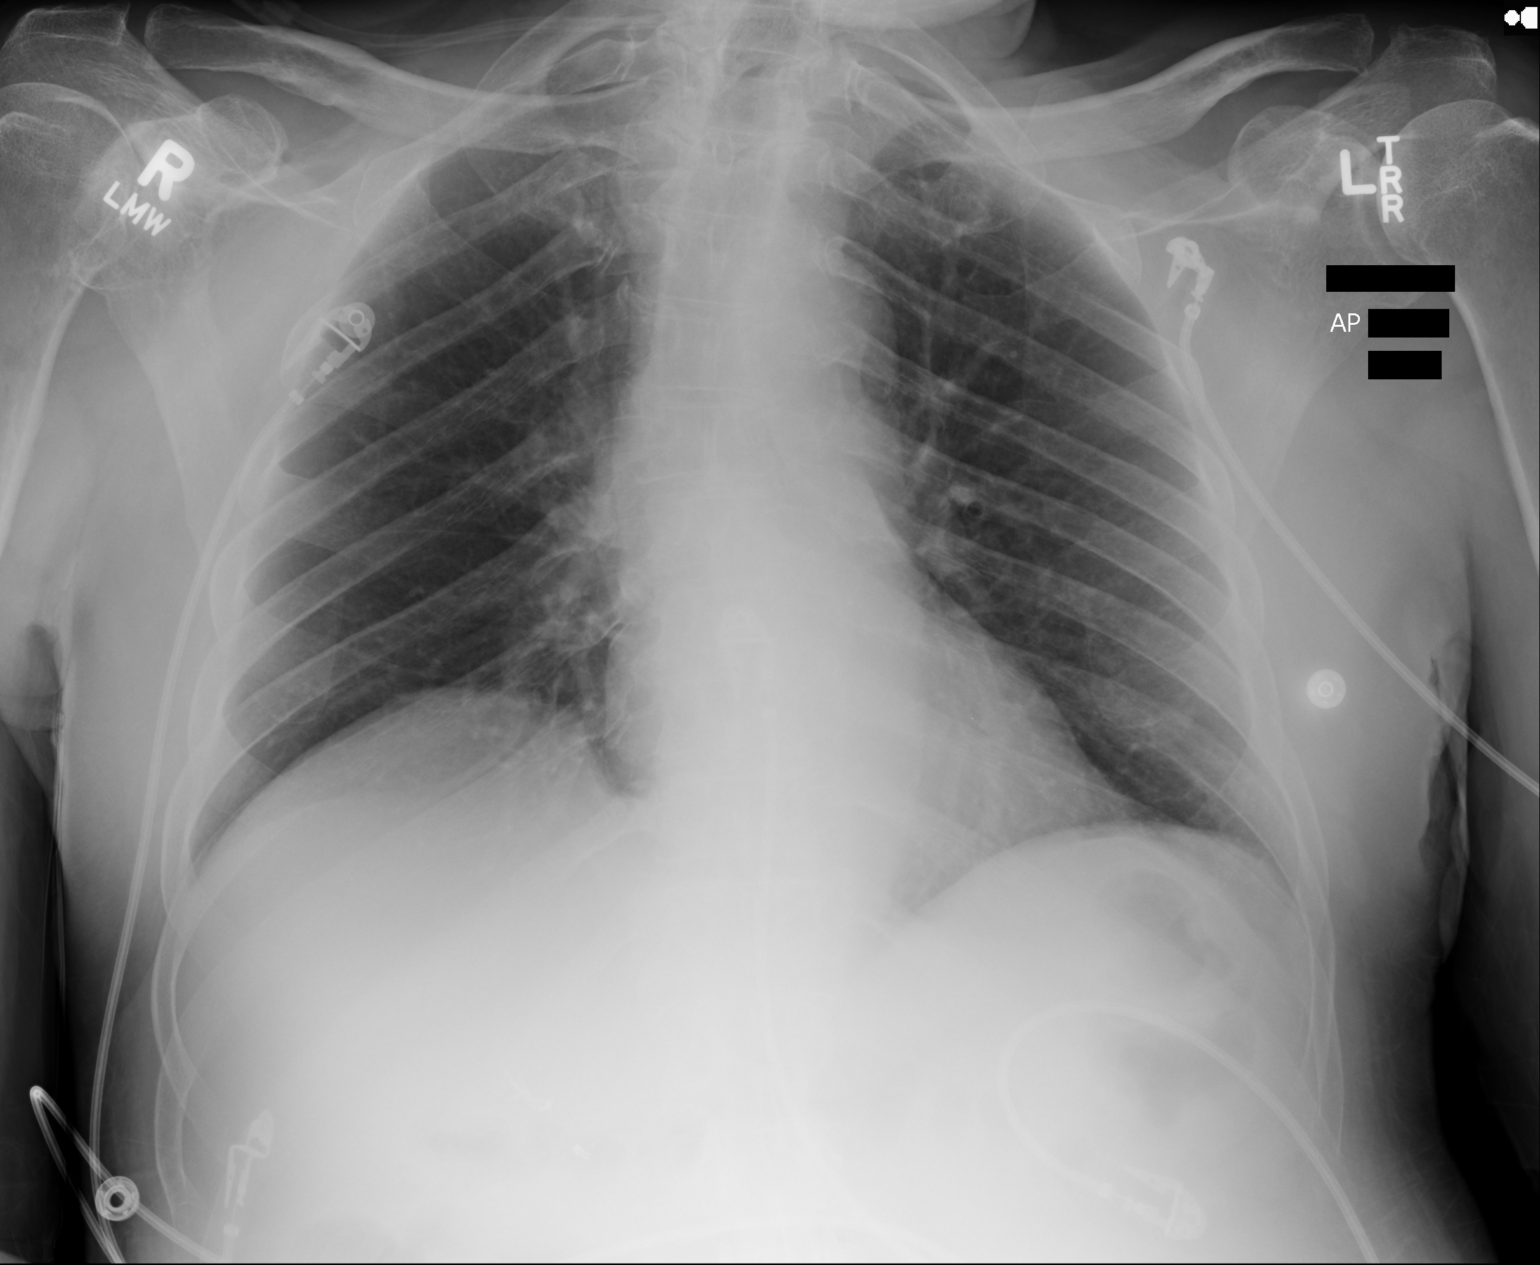

[1 of 1 positions shown; findings below may reference images not displayed]

FINDINGS: The heart size and mediastinal contours are within normal limits.
Both lungs are clear. The visualized skeletal structures are
unremarkable.
IMPRESSION: Normal chest.

## 2014-04-21 ENCOUNTER — Encounter: Payer: Self-pay | Admitting: Internal Medicine

## 2014-04-21 NOTE — Progress Notes (Signed)
Patient ID: Alan Mcintyre, male   DOB: Jan 13, 1935, 79 y.o.   MRN: [id number]    This encounter was created in error - please disregard.

## 2014-05-18 ENCOUNTER — Other Ambulatory Visit: Payer: Self-pay | Admitting: Internal Medicine

## 2014-05-20 IMAGING — CR DG CHEST 1V PORT
1 series · 1 of 1 positions shown · non-contrast
Comparison: August 21, 2013

CLINICAL DATA: Altered mental status and hypoglycemia

EXAM:
PORTABLE CHEST - 1 VIEW

[ap]
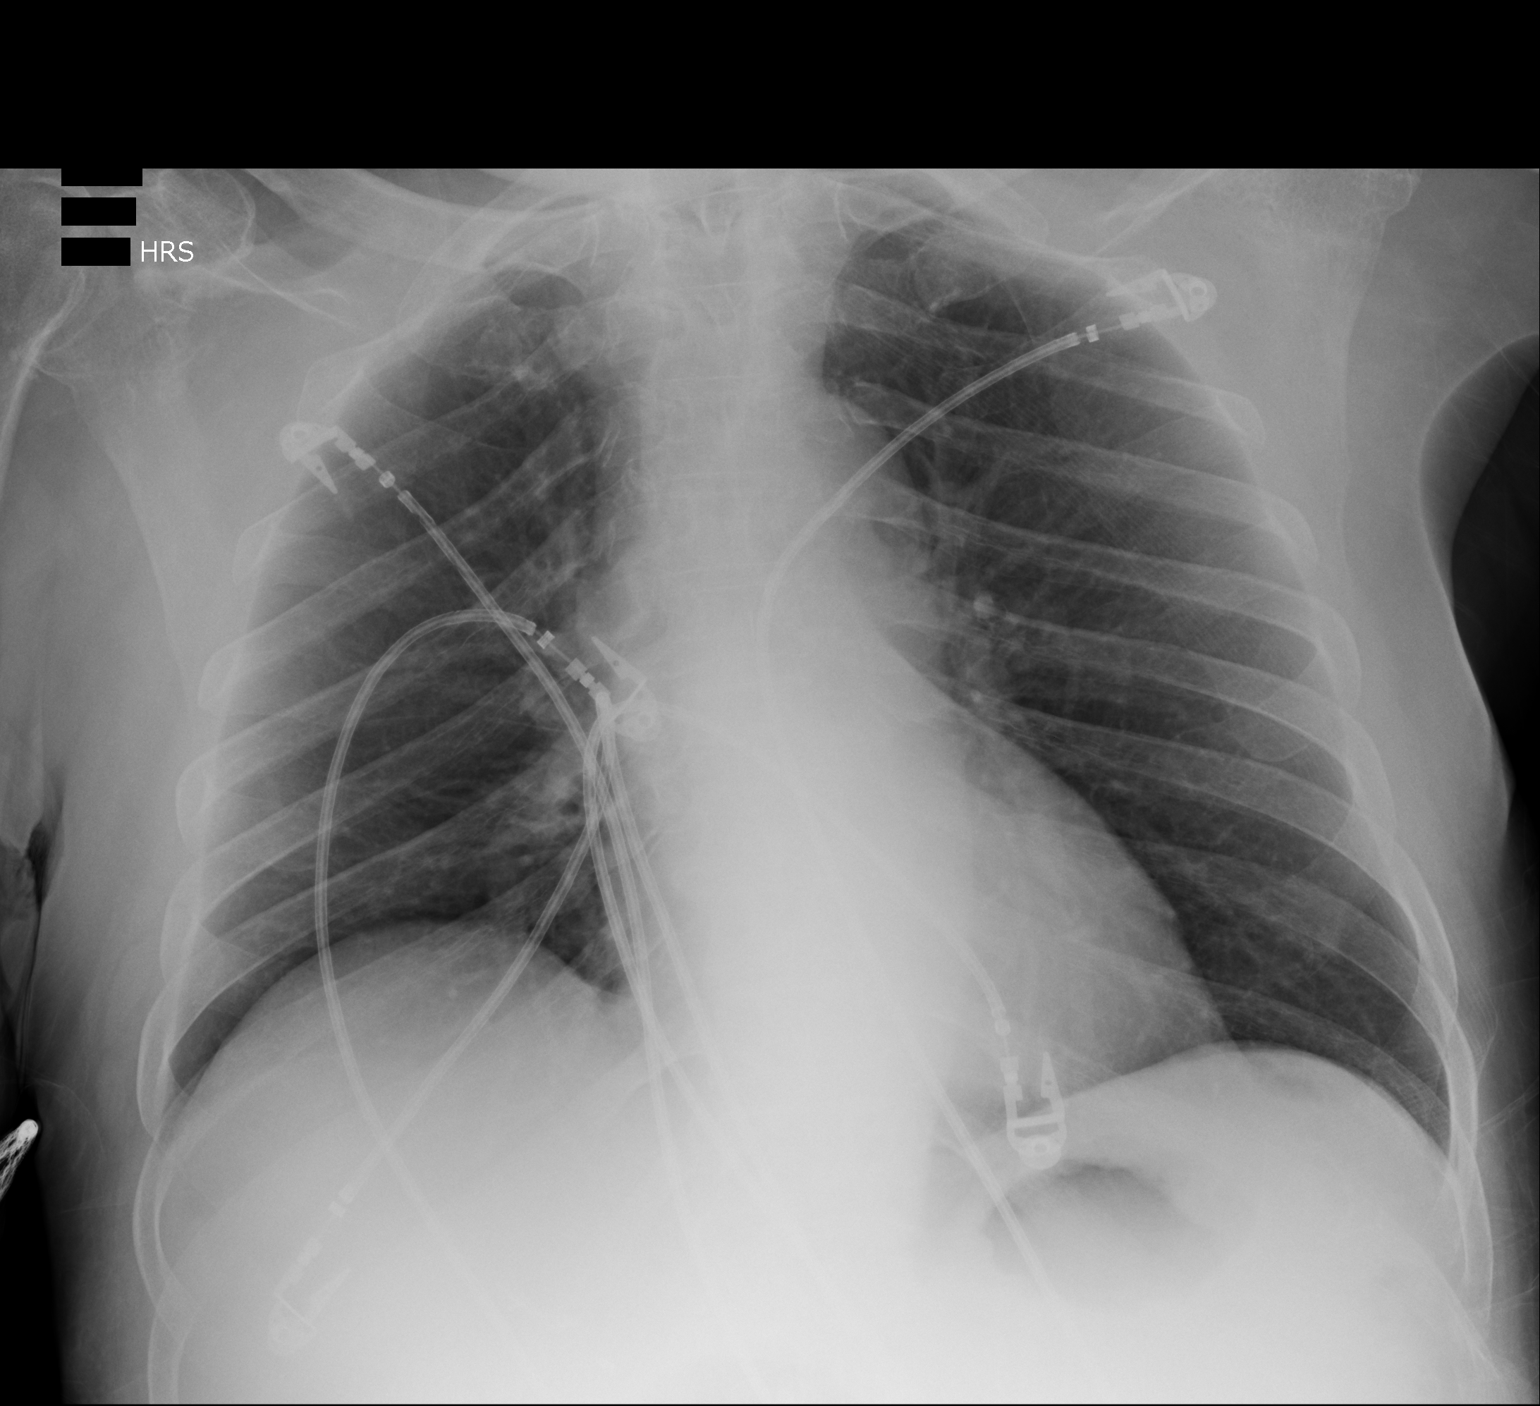

[1 of 1 positions shown; findings below may reference images not displayed]

FINDINGS: Lungs are clear. Heart size and pulmonary vascularity are normal. No
adenopathy. No bone lesions . .
IMPRESSION: No edema or consolidation.

## 2014-06-24 ENCOUNTER — Encounter (HOSPITAL_BASED_OUTPATIENT_CLINIC_OR_DEPARTMENT_OTHER): Payer: Medicare Other | Attending: Internal Medicine

## 2014-06-24 DIAGNOSIS — L89892 Pressure ulcer of other site, stage 2: Secondary | ICD-10-CM | POA: Insufficient documentation

## 2014-06-25 NOTE — Progress Notes (Signed)
[facility]  NAME:  DAEVIN, SWIERK NO.:  [id number]  MEDICAL RECORD NO.:  [id number]      DATE OF BIRTH:  1935/01/25  PHYSICIAN:  Ricard Dillon, M.D.      VISIT DATE:                                  OFFICE VISIT   Alan Mcintyre is a 79 year old man who comes in with attendance from his assisted living in [place].  Apparently, he was recently hospitalized and then seems to have been sent to [facility], at which time, he had osteomyelitis under chronic heel ulcers.  It would appear that he was discharged from there to a local nursing home to complete his Invanz and wound care.  He has been back at his Assisted Living in [place] where he has been living for a number of years for the last month.  He had previously been in this clinic for bilateral heel ulcers and actually these were resolved in January 2015.  I believe he also had an ulcer on the right ankle.  Since that time, he had a stent in the superficial femoral artery on the right verified by review of his assisted living records.  I am not quite certain about the nature of the recent acute hospitalization or if it was for something else other than the osteomyelitis I do not have that information at this point.  PAST MEDICAL HISTORY: 1. Type 2 diabetes with known peripheral vascular disease, nephropathy     with a baseline creatinine of 1.6-1.7. 2. COPD. 3. Hypertension. 4. Anemia. 5. Dementia. 6. BPH. 7. History of colon cancer. 8. Diabetic gastroparesis.  PAST SURGICAL HISTORY: 1. Spine surgery on his back x2. 2. Right superficial femoral artery angioplasty by Dr. Graylon Good.  CURRENT MEDICATIONS: 1. Vitamin D3 1000 units daily. 2. Omeprazole 20 b.i.d. 3. Colace 100 b.i.d. 4. Norvasc 5 daily. 5. Plavix 75 daily. 6. ASA 81 daily. 7. Mag oxide 400 daily. 8. Avodart 0.5 daily. 9. Flomax 0.4 daily. 10.Levemir 25 units in the morning, 28 units in the  evening. 11.Metoprolol 50 b.i.d. 12.Gabapentin 300 b.i.d. 13.Probiotics 1 three times a day. 14.Reglan 10 mg 3 times a day. 15.Hydralazine 10 mg q.6. 16.Oxycodone 5 q.6 p.r.n.  PHYSICAL EXAMINATION:  VITAL SIGNS:  Temperature is 98, pulse 71, respirations 18, blood pressure 162/75. EXTREMITIES:  Wound exam, this man has 2 healed ulcers over his buttocks and sacral area.  The aforementioned bilateral heel ulcers also were completely closed.  The only area of concern is over the anterior part of his foot and ankle.  He has an area that is mostly healed, however, there is a small open area in the middle of this.  This did not appear to be infected.  There was no tenderness.  I thought it would be important to dress this aggressively to make sure this does not turn into anything more ominous.  IMPRESSION/PLAN: 1. Wagner's grade 3 diabetic foot ulcers with underlying osteomyelitis     of the heels.  This actually has miraculously healed.  I believe he     was in a [facility] in the summer.  He has completed     his antibiotics.  There is no need for further imaging studies here     that  I can determine. 2. Wagner's grade 2 wound on the right anterior ankle.  This is a very     tiny open area and does not look completely resolved yet.  We     dressed this with silver alginate, Kerlix, and a light Coban     dressing.  ABI in this clinic was 0.85 which is stable from the     last time he is here as noted he has had prior an angioplasty in     April of this year. 3. Pressure ulcer on his buttocks.  These were also resolved.  In terms of secondary prevention, the patient already has an off-loading mattress on his bed.  He has bunny boots.  He is nonambulatory spending most of the day in the wheelchair.  I am not expecting much difficulty getting the area on the right anterior ankle to look somewhat more stable.  I will see him again next week.  I would not be surprised if  we can discharge him from the clinic at that point.          ______________________________ Ricard Dillon, M.D.     MGR/MEDQ  D:  06/24/2014  T:  06/25/2014  Job:  [id number]

## 2014-07-22 ENCOUNTER — Emergency Department: Payer: Self-pay | Admitting: Emergency Medicine

## 2014-07-22 LAB — CBC WITH DIFFERENTIAL/PLATELET
BASOS PCT: 0.9 %
Basophil #: 0.1 10*3/uL (ref 0.0–0.1)
Eosinophil #: 0.4 10*3/uL (ref 0.0–0.7)
Eosinophil %: 4.9 %
HCT: 33.7 % — ABNORMAL LOW (ref 40.0–52.0)
HGB: 10.7 g/dL — ABNORMAL LOW (ref 13.0–18.0)
Lymphocyte #: 2.7 10*3/uL (ref 1.0–3.6)
Lymphocyte %: 30.9 %
MCH: 26.9 pg (ref 26.0–34.0)
MCHC: 31.7 g/dL — ABNORMAL LOW (ref 32.0–36.0)
MCV: 85 fL (ref 80–100)
MONO ABS: 0.8 x10 3/mm (ref 0.2–1.0)
Monocyte %: 9 %
NEUTROS PCT: 54.3 %
Neutrophil #: 4.8 10*3/uL (ref 1.4–6.5)
PLATELETS: 313 10*3/uL (ref 150–440)
RBC: 3.98 10*6/uL — ABNORMAL LOW (ref 4.40–5.90)
RDW: 17.5 % — AB (ref 11.5–14.5)
WBC: 8.8 10*3/uL (ref 3.8–10.6)

## 2014-07-22 LAB — BASIC METABOLIC PANEL
Anion Gap: 6 — ABNORMAL LOW (ref 7–16)
BUN: 27 mg/dL — AB (ref 7–18)
CHLORIDE: 107 mmol/L (ref 98–107)
Calcium, Total: 8.7 mg/dL (ref 8.5–10.1)
Co2: 28 mmol/L (ref 21–32)
Creatinine: 2.31 mg/dL — ABNORMAL HIGH (ref 0.60–1.30)
EGFR (African American): 36 — ABNORMAL LOW
GFR CALC NON AF AMER: 29 — AB
Glucose: 320 mg/dL — ABNORMAL HIGH (ref 65–99)
Osmolality: 299 (ref 275–301)
Potassium: 4.2 mmol/L (ref 3.5–5.1)
Sodium: 141 mmol/L (ref 136–145)

## 2014-07-22 LAB — URINALYSIS, COMPLETE
Bacteria: NONE SEEN
Bilirubin,UR: NEGATIVE
Blood: NEGATIVE
KETONE: NEGATIVE
Leukocyte Esterase: NEGATIVE
Nitrite: NEGATIVE
Ph: 5 (ref 4.5–8.0)
Protein: >=500
RBC,UR: 1 /HPF (ref 0–5)
Specific Gravity: 1.014 (ref 1.003–1.030)
Squamous Epithelial: NONE SEEN
WBC UR: 12 /HPF (ref 0–5)

## 2014-07-24 LAB — URINE CULTURE

## 2014-10-21 ENCOUNTER — Encounter: Payer: Self-pay | Admitting: Podiatry

## 2014-10-21 ENCOUNTER — Ambulatory Visit (INDEPENDENT_AMBULATORY_CARE_PROVIDER_SITE_OTHER): Payer: Medicare Other | Admitting: Podiatry

## 2014-10-21 DIAGNOSIS — E1140 Type 2 diabetes mellitus with diabetic neuropathy, unspecified: Secondary | ICD-10-CM | POA: Diagnosis not present

## 2014-10-21 DIAGNOSIS — B351 Tinea unguium: Secondary | ICD-10-CM | POA: Diagnosis not present

## 2014-10-21 DIAGNOSIS — E1149 Type 2 diabetes mellitus with other diabetic neurological complication: Secondary | ICD-10-CM

## 2014-10-21 NOTE — Progress Notes (Signed)
   Subjective:    Patient ID: Alan Mcintyre, male    DOB: [birth year], 80 y.o.   MRN: [id number]  HPI 80 year old male presents the office today with caregivers for complaints of painful elongated thickened toenails. The toenails are very elongated causing irritation shoe gear. Denies any drainage or redness from the nail sites. He also has a history of ulceration to the heels for which she was treated the wound care center for previously. Denies any recent ulceration. No other complaints at this time.   Review of Systems  Endocrine: Positive for cold intolerance.  Musculoskeletal: Positive for gait problem.  Neurological: Positive for weakness.       Objective:   Physical Exam Awake, alert, NAD DP/PT pulses palpable, CRT less than 3 seconds Protective sensation appears to be decreased with Derrel Nip monofilament, decrease Achilles tendon reflex Nails are significantly hypertrophic, dystrophic, elongated, brittle, discolored 10. There is subjective tenderness on nails 1-5 bilaterally. There is no surrounding erythema or drainage on the nails sites. Mild interdigital maceration. Mild amount of hyperkeratotic tissue bilateral heels overlying the site of prior ulceration. Upon debridement there is no underlying ulceration, drainage, or other clinical signs of infection this time. There does not appear to be any other areas of tenderness to bilateral lower extremities. There is mild chronic edema bilaterally. There is no assisted erythema or increase in warmth. No pain with calf compression, erythema, increase in warmth.     Assessment & Plan:  80 year old male with symptomatically onychomycosis, history of ulceration bilateral heels, neuropathy -Treatment options discussed with the patient/ caregivers including alternatives, risks, complication -Nail sharply debrided 10 without complication/bleeding -Dry thoroughly between the toes. -Bilateral heels were debrided without  complications. No underlying ulceration this time. -Discussed daily foot inspection Follow-up in 3 months or sooner if any problems are to arise. In the meantime occurs to call the office with any questions, concerns, change in symptoms.-

## 2014-11-02 NOTE — Op Note (Signed)
PATIENT NAME:  Alan Mcintyre, Alan Mcintyre MR#:  [id number] DATE OF BIRTH:  [birth year]  DATE OF PROCEDURE:  02/19/2012  PREOPERATIVE DIAGNOSIS: Acute cholecystitis with cholelithiasis.   POSTOPERATIVE DIAGNOSIS: Acute cholecystitis with cholelithiasis.   PROCEDURE: Laparoscopic cholecystectomy, attempted cholangiogram.   SURGEON: Vella Kohler, M.D.   FINDINGS: The patient had very huge gallstones and had to make the incision bigger to get them out of the abdomen. Also was very hard to grab the gallbladder.   DESCRIPTION OF PROCEDURE: After the patient was put to sleep, a small incision was made above the umbilicus. After cutting skin and subcutaneous tissue, the fascia was then held with a stitch and the trocar was then put in under direct vision. CO2 was insufflated and another trocar was put in the epigastric region, two 5 mm were put in the right upper quadrant of the abdomen. After looking at the gallbladder, the gallbladder was very big and I was unable to lift up with a clamp because there were huge stones. The gallbladder was contracted around them. I tried to aspirate it and small amount of thick bile came out. Finally I put two trocars on the right side and started lifting the gallbladder up. Dissection was done over the gallbladder/cystic duct junction. Difficult part was that the duodenum was stuck and also all the adhesions. I slowly and slowly released all the adhesions, although I had a hard time grasping the gallbladder. I attempted to do a cholangiogram through the gallbladder with the Kumar clamp, but the patient had such huge stones that it was difficult to do. I could see the cystic duct very nicely. After dissecting the cystic duct very nicely, I clipped the  cystic duct and then the cystic artery was then cut and the gallbladder lifted up from the liver. The main trouble was to remove it through the belly button. I put the gallbladder in a plastic bag and made the incision in the umbilicus  bigger as well as to the fascia. It took about half an hour to pull all the gallbladder out with the stones. After this was done, I stitched the belly button back and then put a trocar back in and went back to look at the wound. The right upper quadrant was irrigated twice many times to irrigate out any blood clots. I removed a lot of stones which were in the peritoneal cavity. When everything was dried out and made sure everything was okay, I then put a drain in because it was a raw surface and he might bleed from there. So the drain was then brought out through one of the trocars and put in the abdomen. The abdomen was then closed and the fascia with multiple stitches applied in the fascia to close the fascia and then clips applied. Marcaine was injected. The patient tolerated the procedure well and was sent to the recovery room in satisfactory condition.  ____________________________ Welford Roche Phylis Bougie, MD [id number] D: 02/19/2012 09:56:20 ET T: 02/19/2012 11:20:26 ET JOB#: [id number]  cc: Neelie Welshans S. Phylis Bougie, MD, <Dictator> Meindert A. Brunetta Genera, [place] MD ELECTRONICALLY SIGNED 02/21/2012 10:29

## 2014-11-05 NOTE — Consult Note (Signed)
PATIENT NAME:  Alan Mcintyre, Alan Mcintyre MR#:  [id number] DATE OF BIRTH:  [birth year]  DATE OF CONSULTATION:  04/21/2013  REFERRING PHYSICIAN:  Sherri Rad, MD CONSULTING PHYSICIAN:  Ceasar Lund. Anselm Jungling, MD  REASON FOR MEDICAL CONSULTATION: Management of diabetes and presurgical medical clearance.   HISTORY OF PRESENT ILLNESS: This is a 80 year old male who is sent from [facility] for not feeling well. The patient is demented or disoriented currently so not able to tell me any history and as per the [facility] there is just few history available about his past medical history and current medication, but not much detail available. Emergency Room physician also tried to call [facility] to get more contact numbers about family and more details, but they were not successful as there is no contact number with [facility] also. I called the number which is in the chart for his contact next of kin and left them a message to call me back about this patient. So far I am unsuccessful to get any further history about the patient so history is obtained from the ER physician and the results available in the chart. In ER, the patient's abdomen was found distended and tender so they did CT scan of the abdomen which was suggestive of intestinal obstruction and surgical team was contacted for the patient's management. The patient's blood pressure was also on the lower side, 83/60, and he was tachycardic up to 120. Surgery doctor came in and put the NGT in and there is fecal-like material that is coming out from the NGT so he is being admitted to critical care unit for small bowel obstruction and possible need of surgery. Medical service is called in for further management, clearance for presurgery and management of diabetes.   REVIEW OF SYSTEMS:  Unable to get as the patient is demented.   PAST MEDICAL HISTORY: As per the paper available from the nursing home for transfer: 1.  Acute  cholecystitis.  2.  Chronic constipation.  3.  Dysphagia.  4.  Colon cancer.  5.  Emphysema.  6.  Upper GI bleed.  7.  Diabetes.  8.  Arthritis.  9.  Transient ischemic attack. 10.  Back fusion surgeries.  MEDICATIONS: As per the nursing home records: 1.  Percocet 5 mg q. 4 hours as needed for pain.  2.  Pioglitazone 15 mg oral daily.  3.  Vitamin D3 2000 international units daily.  4.  Omeprazole 40 mg delayed-release daily.  5.  Detrol LA 4 mg extended-release daily.  6.  Januvia 100 mg daily.  7.  Polyethylene glycol 17 grams in 8 ounces of water p.o. daily.  8.  Hydrochlorothiazide 25 mg p.o. daily.  9.  Ramipril 10 mg p.o. daily.  10.  Tramadol 50 mg p.o. t.i.d.  11.  Aspirin enteric-coated 81 mg p.o. daily.  12.  Mometasone topical cream daily.  13.  Glipizide 10 mg oral daily.  14.  Meloxicam 7.5 mg p.o. b.i.d.  15.  Lovastatin 40 mg p.o. at night.   FAMILY AND SOCIAL HISTORY:  Are unavailable currently because the patient is not able to give me any details and we could not find any contact so far.  PHYSICAL EXAMINATION: VITAL SIGNS: In the ER, pulse 120 on presentation, temperature 98, respiration was 22, blood pressure 83/60 and pulse ox 95 on room air. Blood pressure came up after IV fluid to 102/58.  GENERAL: The patient is alert but disoriented and he does not  appear in any acute distress at this time. NGT is present draining yellowish foul smelling secretion. Foley catheter in place.  HEENT: Head and neck atraumatic. Conjunctiva pink. Oral mucosa dry.  NECK: Supple. No JVD.  LUNGS: Bilateral clear and equal air entry.  HEART: S1 and S2 present, regular, tachycardia present. No murmur.  ABDOMEN: Mild tenderness, distended. Bowel sounds sluggish. No organomegaly appreciated.  SKIN: No rashes.  LEGS: No edema.  NEUROLOGICAL: He moves all 4 limbs but generalized overall weakness. No rigidity.  LABORATORY AND DIAGNOSTICS: Glucose 268, BUN 51, creatinine 4.17, sodium  138, potassium 4.4, chloride 106, CO2 22. Magnesium 1.5. Phosphorus 5.0. Calcium 8.9. Lipase 195. Total protein 7.7, albumin 3.6, bilirubin 0.9, SGOT 37, SGPT 39. Troponin less than 0.02. WBC 4.2, hemoglobin 12.6, platelet count 329, MCV 89. INR is 1.2.   Chest x-ray, portable, single view: No acute cardiopulmonary disease.  CT scan of abdomen and pelvis without contrast showed small bowel dilatation with multiple small bowel air fluid level, decompression of distal ileum most consistent with small bowel obstruction with no definite transition zone identified.   ASSESSMENT AND PLAN: A 80 year old male with unknown detailed past medical history, but as per the very few records available from nursing home has emphysema, dysphagia, colon cancer, chronic constipation, upper gastrointestinal bleed history, diabetes, arthritis and transient ischemic attack and presented with small bowel obstruction. Currently, he is either confused or he has this baseline dementia, not able to give any history. Found having hypotension and has small bowel obstruction, draining some fecal like material in NGT in the ER.  1.  Small bowel obstruction. Management as per surgery. Currently NGT suction and may go for surgery. Surgery team asked for medical clearance before surgery. According to the record, the patient has history of emphysema. Currently, the patient is not having any wheezing so from respiratory point of view the patient is stable for surgery, but we do not have any known cardiac history or his cardiac status is unknown to [place] so I would like to get an echocardiogram before proceeding for surgery to have risk stratification for surgery, but if the surgery becomes unavoidable or urgent then we may proceed with surgery and considering him as moderate to high risk in situation of unknown cardiac status, although his troponin is negative in ER and his EKG does not show any significant changes.  2.  Hypotension. Possibly this  is due to dehydration. Continue IV fluid support and monitoring.  3.  Acute renal failure. We do not have previous records for comparison. Creatinine is 4.17. Will call nephrology consult for further management. Meanwhile, most likely it looks like because of dehydration. We will continue giving IV fluids and monitor his renal function tomorrow morning. 4.  Diabetes. The patient was taking pioglitazone and glipizide as per the [facility] records, but we would not give any medication and we will just do finger stick without any coverage as the patient is currently n.p.o. and if needed then we might put on sliding scale coverage later on if sugar remains high.  5.  Hypertension. As his home medication contains hydrochlorothiazide and Ramipril, he also seem to be having hypertension, but currently he is hypotensive and on normal saline so we will not give any hypertensive medication at this time.  6.  Chronic constipation. He was taking stool softener and MiraLax. Currently, presented with small bowel obstruction so management as per the surgical team.   Code status currently is FULL CODE in situation  of unknown history and relatives.   Thank you for the opportunity for participating in the patient's management. We will continue following and wait for the echo for further evaluation of the patient's overall condition and we will try to contact the family if we are able to get any details later on.  Condition is critical due to bowel obstruction and hypotension.  TOTAL TIME SPENT ON THIS CONSULTATION: 60 minutes.  ____________________________ Ceasar Lund Anselm Jungling, MD vgv:sb D: 04/21/2013 16:24:07 ET T: 04/21/2013 17:05:41 ET JOB#: [id number]  cc: Ceasar Lund. Anselm Jungling, MD, <Dictator> Vaughan Basta MD ELECTRONICALLY SIGNED 04/27/2013 23:07

## 2014-11-05 NOTE — Discharge Summary (Signed)
PATIENT NAME:  Alan Mcintyre, Alan Mcintyre MR#:  [id number] DATE OF BIRTH:  Dec 23, 1934  DATE OF ADMISSION:  06/19/2013 DATE OF DISCHARGE:  06/22/2013  PRIMARY CARE PHYSICIAN: Dr. Brunetta Genera.   DISCHARGE DIAGNOSES:  1.  Acute encephalopathy.  2.  Dehydration.  3.  Hypernatremia.  4.  Malignant hypertension.  5.  Acute renal failure over chronic kidney disease.  6.  Diabetes mellitus, type 2.  7.  History of cerebrovascular accident.  8.  Benign prostatic hypertrophy.  9.  Chronic bilateral heel pressure ulcers.   IMAGING STUDIES DONE: Include a CT scan of the head without contrast, which showed atrophy, old tiny lacunar left basal ganglia infarct and nothing acute. Abdomen 2-way of the chest showed no acute abnormalities. Normal gas pattern. Bilateral feet x-rays showed no signs of osteomyelitis.   CONSULTS: None.   ADMITTING HISTORY AND PHYSICAL AND HOSPITAL COURSE: Please see detailed H and P dictated by Dr. Manuella Ghazi previously. In brief, a 80 year old male patient with history of colon cancer, hypertension, diabetes, who presented to the hospital with altered mental status,  tachycardia and some fever. The patient was thought to be in sepsis, admitted to the hospitalist service and started on broad-spectrum antibiotics.   HOSPITAL COURSE:  1.  Acute encephalopathy: This was thought secondary to dehydration and hypernatremia, which resolved with IV fluids and the patient returned back to baseline. The patient was on broad-spectrum antibiotics, which have been stopped. Cultures have been negative. The patient has been afebrile by the time of discharge. His urine was clear. Chest x-ray showed no pneumonia. Blood cultures negative. For concern for possible osteomyelitis on his feet, x-rays were done, which did not show any osteomyelitis and these ulcers were not deep enough touching the bone.  No family could be the gotten in touch with. The patient is being returned back to the family care home from where he  presented. The patient will be full code.   Prior to discharge, the patient is awake, alert,  conversational and returned to baseline.   DISCHARGE MEDICATIONS: Include:  1.  Omeprazole 40 mg oral once a day.  2.  Vitamin D3 2000 international units once a day.  3.  Albuterol ipratropium 3 mL nebulizer p.r.n.  4.  Acetaminophen/hydrocodone 325/5 mg 1 tablet every 4 hours as needed for pain. 5.  Magnesium oxide 400 mg oral once a day.  6.  Metoprolol tartrate 25 mg oral 2 times a day. 7.  Clonidine 0.1 mg oral 2 times a day.  8.  Dutasteride 0.5 mg oral once a day.  9.  Tamsulosin 0.4 mg oral once a day.   The patient's blood pressure medications have been continued. He did have malignant hypertension during the hospital stay as he missed medication doses. This is in the normal range by the time of discharge on home meds.  DISCHARGE INSTRUCTIONS: Home health has been set up for physical therapy, nurse and social worker. Low-sodium diet. Activity as tolerated. Follow up with primary care physician in 1 to 2 weeks.   TIME SPENT ON DAY OF DISCHARGE IN DISCHARGE ACTIVITY: 35 minutes.  ____________________________ Leia Alf Chaquana Nichols, MD srs:aw D: 06/23/2013 13:29:03 ET T: 06/23/2013 14:07:04 ET JOB#: [id number]  cc: Alveta Heimlich R. Dvante Hands, MD, <Dictator> Meindert A. Brunetta Genera, MD Neita Carp MD ELECTRONICALLY SIGNED 06/24/2013 14:09

## 2014-11-05 NOTE — H&P (Signed)
PATIENT NAME:  Alan Mcintyre, Alan Mcintyre MR#:  [id number] DATE OF BIRTH:  05-18-1935  DATE OF ADMISSION:  06/19/2013  PRIMARY CARE PHYSICIAN: Dr. Brunetta Genera.   REFERRING PHYSICIAN:  Dr. Francene Castle,   CHIEF COMPLAINT: Altered mental status, fever, tachycardia.   HISTORY OF PRESENT ILLNESS: The patient is a 80 year old male with a known history of colon cancer, hypertension, diabetes, who had a recent exploratory laparotomy with enterectomy and adhesiolysis the 10th of October by Dr. Leanora Cover.  He is  being admitted for suspected sepsis. The patient was brought in via EMS from [place] care home with complaint of lethargy, fever, tachycardia and hypertension on and off for the last 1 week. It has been worsening for the last 2 to 3 days. The patient has a decreased  responsiveness and has been minimally responsive to verbal stimuli. While in the ED, he had a strong odor to the urine and was complaining of left heel pain. He was found to have a significant tachycardia with heart rate of 118 per minute and a blood pressure of 250/125 mmHg in  the ED with a respiratory rate of 22. He was very lethargic and barely responsive to verbal stimuli and is being admitted for further evaluation and management.   PAST MEDICAL HISTORY: 1.  Hypertension.  2.  Type 2 diabetes.  3.  Chronic constipation.  4.  History of emphysema.  5.  History of TIA.   PAST SURGICAL HISTORY: 1.  Back surgery.  2.  Cholecystectomy.  3.  Colonoscopies.  4. Recent exploratory laparotomy with enterectomy and adhesiolysis for complete small bowel obstruction on 04/24/2013.  FAMILY HISTORY: Unable to obtain as the patient is not able to communicate much.   REVIEW OF SYSTEMS: Unable to obtain as the patient is lethargic.   ALLERGIES: No known drug allergies.   MEDICATIONS AT HOME:  Based on [facility],  which is still being updated, which is likely from last discharge summary, are as follows:  1.  Vitamin D3, 2000 International Units once  daily.  2.  Acetaminophen/hydrocodone 1 tablet p.o. every 4 hours as needed.  3.  DuoNeb inhaled every 4 hours as needed.  4.  Clonidine 0.1 mg p.o. b.i.d.  5.  Dutasteride 0.5 mg p.o. daily.  6.  Heparin 5000 units subQ b.i.d.  7.  Magnesium oxide 400 mg p.o. daily.  8.  Metoprolol 25 mg p.o. b.i.d. 9.  Omeprazole 40 mg p.o. daily. 10.  Tamsulosin 0.4 mg p.o. daily.   PHYSICAL EXAMINATION: VITAL SIGNS: Temperature 99.9, heart rate 118 per minute, respirations 22 per minute, blood pressure 250/125 mmHg, saturating 98% on room air.  GENERAL: The patient is a 80 year old male lying in the bed, seems lethargic and moaning.  He was minimally responsive to verbal stimuli and could not tell me that he is in the hospital, but did not know which city. He was not aware of year. Most of the things he is talking does not make much sense tonight.  EYES: Pupils equal, round, reactive to light and accommodation. No scleral icterus. Extraocular muscles intact.  HEENT: Head atraumatic, normocephalic. Oropharynx and nasopharynx dry and clear.  NECK: Supple.  No jugular venous distension. No thyroid enlargement or tenderness.    LUNGS: Clear to auscultation bilaterally. No wheezing, rales, rhonchi or crepitation.  CARDIOVASCULAR: S1, S2 normal, tachycardic. No murmurs, rubs or gallop.  ABDOMEN: Soft, nontender, nondistended. Bowel sounds present. The wound on the abdomen looks dry and well healed. No signs of infection around there.  MUSCULOSKELETAL: No joint effusion or tenderness.  SKIN: He has bilateral heel ulcers. No obvious signs of infection.  Has a heel protector/pad in place. They are somewhat tender, mainly in the left heel area.    NEUROLOGIC: The patient is moaning. Has eyes closed at times, but does open to verbal stimuli and follows minimal commands. Moving all 4 extremities without any difficulty voluntarily, but they appear weak.  Gait not checked. Sensation seems intact.  PSYCHIATRY: The  patient seems lethargic and disoriented. No hallucination or delusions.  MOUTH:  No obvious lesions or exudate.   LABORATORY, RADIOGRAPHIC AND DIAGNOSTIC DATA: Sodium 146, potassium 4.0, chloride 108, CO2 of 31, BUN 28, creatinine 1.4, serum blood sugar 210.  LFTs showed alkaline phosphatase of 213, AST of 70. CBC showed white count 14.5, hemoglobin 11.9, hematocrit 35.4, platelets 369. Urinalysis is negative.   CT scan of the head without contrast in the ED showed atrophy with small vessel chronic ischemic changes of deep cerebral white matter. Probable tiny an old lacunar infarct in the left basal ganglia. No acute intracranial abnormality.   Abdominal three-way with PA chest was nonspecific. No focal abnormality identified.   EKG shows sinus tachycardia with premature atrial complexes.   IMPRESSION AND PLAN: 1.  Sepsis with fever, tachycardia, altered mental status and leukocytosis, likely from some underlying infection with the source unknown. We will obtain 2 sets of blood cultures, urine culture and sputum culture. Start him on empiric meropenem based on the last wound culture sensitivity from abdominal surgery. If cultures remain neg, this could be metabolic encephaolpathy from severe dehydration also. 2.  Malignant hypertension with a systolic pressure in 0000000 to 123456 and diastolics in [id number] to [id number]. We will start him on nicardipine drip and admit him to CCU stepdown.  3.  Hypernatremia, likely from dehydration. We will start him on half normal saline. If it does not improve or worsens, consider changing to D5 water.  4.  Diabetes. We will start him on sliding scale insulin for now.  5.  Heel wound. We will consult wound care nurse for dressing changes. He has a heel protector or pad in place.  6.  Acute on chronic kidney disease, likely from sepsis or dehydration. We will monitor with IV hydration.   CODE STATUS: FULL CODE.   He remains a very high risk for multiorgan failure with  possible sepsis. We will monitor him in the CCU stepdown. He is a very high risk for cardiorespiratory compromise also.    Total time taking care of this patient (critical care), 55 minutes.    ____________________________ Lucina Mellow. Manuella Ghazi, MD vss:dmm D: 06/19/2013 20:46:00 ET T: 06/19/2013 21:16:15 ET JOB#: [id number]  cc: Bri Wakeman S. Manuella Ghazi, MD, <Dictator> Meindert A. Brunetta Genera, [place] MD ELECTRONICALLY SIGNED 06/25/2013 12:07

## 2014-11-05 NOTE — Consult Note (Signed)
Brief Consult Note: Diagnosis: Small Bowel Obstruction, Dm, Clearance for sx.   Patient was seen by consultant.   Consult note dictated.   Recommend further assessment or treatment.   Orders entered.   Comments: No sourse of history available. Pt is confused/ demented. ER called [place] care home- they have limited history- which they sent with him. No contact of family. Will get Echo to know cardiac status, for surgery.  Electronic Signatures: Vaughan Basta (MD)  (Signed 07-Oct-14 16:05)  Authored: Brief Consult Note   Last Updated: 07-Oct-14 16:05 by Vaughan Basta (MD)

## 2014-11-05 NOTE — Discharge Summary (Signed)
PATIENT NAME:  Alan Mcintyre, Alan Mcintyre MR#:  [id number] DATE OF BIRTH:  Dec 05, 1934  DATE OF ADMISSION:  04/21/2013 DATE OF DISCHARGE:  05/06/2013  PRINCIPLE DIAGNOSIS: Complete small bowel obstruction.   OTHER DIAGNOSES: 1.  History of colon cancer. 2.  Dementia.  3.  Hypertension.  4.  Chronic constipation.  5.  Type 2 diabetes mellitus.  6.  Emphysema.  7.  History of transient ischemic attack.  8.  History of back surgery (fusions).  9.  Superficial wound infection. 10.  Benign prostatic hypertrophy.  PRINCIPAL PROCEDURE PERFORMED DURING THIS ADMISSION: On 04/24/2013, exploratory laparotomy with enterectomy and adhesiolysis.   HOSPITAL COURSE: The patient was admitted to the hospital and had medical clearance and management and was taken to the operating room where he underwent the above-mentioned procedure for the above-mentioned diagnosis. By postoperative day 10, he was doing well, except he had very little fluid intake the day before, and he had developed significant oliguria and his creatinine went up from 1.3 to 2.7. His prealbumin was quite low at 5. His abdomen was still distended and tympanitic and consistent with a postoperative ileus. Therefore, he was started on Entereg. By postoperative day 12 (the day of discharge), he was only very mildly tachycardic. His oxygen saturation was 97% on room air, despite the fact that he had received a single dose of Lasix for wheezing the day before. He had 4 bowel movements, but his stomach was still "swollen."  He was still quite somnolent in that he would fall back asleep after eating 1 piece of toast. His abdomen was still distended but less tympanitic and nontender, and his creatinine was down to 2.0. His white blood cell count was still elevated at 21 but had improved over a few days earlier. He had several episodes of hypoglycemia, and his ileus appeared to be improving on Entereg, but he remained quite deconditioned. Arrangements were made to  discharge him to a long-term acute care facility and a bed became available.   MEDICATIONS AT THE TIME OF DISCHARGE:  Were as follows: Entereg 12 mg p.o. b.i.d., vitamin D3, 2000 units p.o. daily; clonidine 0.1 mg p.o. b.i.d., Avodart 0.5 mg p.o. daily, magnesium oxide 400 mg p.o. daily, metoprolol 25 mg p.o. b.i.d., omeprazole 40 mg p.o. q.a.m., Flomax 0.4 mg p.o. daily, heparin subcutaneous 5000 units q.12h., albuterol/ipratropium nebulizer treatment, 3 mL inhaled q.6h., Norco 5/325, 1 to 2 q.4-6h. p.r.n.   The patient had a wound VAC in place on a portion of his midline wound and would be transferred to the long-term acute care facility with that in place. An order was placed to have the patient return to see Dr. Pat Patrick in 1 to 2 weeks and call our office for any specific concerns regarding his surgery.      ____________________________ Consuela Mimes, MD wfm:dmm D: 05/06/2013 09:54:43 ET T: 05/06/2013 10:22:12 ET JOB#: [id number]  cc: Consuela Mimes, MD, <Dictator> Consuela Mimes MD ELECTRONICALLY SIGNED 05/06/2013 15:16

## 2014-11-05 NOTE — Op Note (Signed)
PATIENT NAME:  Alan Mcintyre, Alan Mcintyre MR#:  [id number] DATE OF BIRTH:  1934-11-15  DATE OF PROCEDURE:  04/24/2013  PREOPERATIVE DIAGNOSIS:   Complete small bowel obstruction.   POSTOPERATIVE DIAGNOSIS:  Complete small bowel obstruction, adhesive.   PROCEDURE PERFORMED:  Exploratory laparotomy with small bowel resection and lysis of adhesions.   SURGEON:  Sherri Rad, M.D.   ASSISTANT:  None.   ANESTHESIA:  General endotracheal.   FINDINGS:  There was a complete bowel obstruction of the mid ileum secondary to an adhesed and chronically scarred loop of intestine to the undersurface of the previous umbilical trocar site from previous cholecystectomy.   SPECIMENS:  Small bowel to pathology.   ESTIMATED BLOOD LOSS:  Minimal.   DESCRIPTION OF PROCEDURE:  With informed consent, supine position, general oral endotracheal anesthesia was induced.  The patient's abdomen was widely prepped and draped utilizing ChloraPrep solution.  Timeout was observed.  Skin incision was fashioned in the midline, centered around the umbilicus and carried through musculofascial layers with electrocautery.  The bowel was eviscerated and found to be massively dilated to a point at the umbilicus.  This was taken down with sharp dissection.  The bowel was markedly narrowed in this area.  There was a significant serosal tear in taking down the adhesions the sharp scissors and as such I felt that a small bowel resection would be the best option.   The small bowel on either side of the enterotomy was divided with a GIA 75 stapler.  The intervening mesentery was then sequentially scored, cut, clamped and tied utilizing #0 Vicryl sutures.  A side-to-side functional end-to-end anastomosis was fashioned between the two limbs of the intestine by aligning them with seromuscular 3-0 silk suture.  Corner of each staple line was excised.  Common channel enterotomy was formed utilizing a GIA 75 stapler.  The TA-60 stapler was used to reapproximate  the common channel enterotomy.  Seromuscular 3-0 silk sutures were then placed as anti-traction sutures and imbrication of the staple line.  3-0 chromic was used to close the mesenteric defect.  The abdomen was then irrigated and aspirated dry.  Lap and needle count correct x 2 and the abdominal midline fascia was closed with running #1 PDS suture from the extreme to the wound.  Subcutaneous tissues were irrigated.  Staples were used to reapproximate skin edges.  Sterile dressing was applied and the patient was then subsequently extubated and taken to the recovery room in stable and satisfactory condition by anesthesia services.     ____________________________ Jeannette How Marina Gravel, MD Ricke Hey D: 04/25/2013 18:17:32 ET T: 04/25/2013 23:08:38 ET JOB#: [id number]  cc: Elta Guadeloupe A. Marina Gravel, MD, <Dictator> Hortencia Conradi MD ELECTRONICALLY SIGNED 04/30/2013 14:44

## 2014-11-05 NOTE — H&P (Signed)
PATIENT NAME:  Alan Mcintyre, Alan Mcintyre MR#:  [id number] DATE OF BIRTH:  September 10, 1934  DATE OF ADMISSION:  04/21/2013  HISTORY OF PRESENT ILLNESS: This is a 80 year old male with a several day history of abdominal pain, a couple of loose stools, progressive distention, mild nausea, an episode of emesis in the Emergency Room. He was brought from a local nursing home. He has not been able to eat well. He has been feeling weak. Workup in the Emergency Room is suggestive of a high-grade small bowel obstruction. The patient was found to have marked hypotension and acute renal failure. Several liters of fluid were given. The patient's blood pressure responded. Surgical services were asked to consult. His past surgical history is only significant  for a laparoscopic cholecystectomy done approximately a year ago, which was complicated by postoperative bleeding. He has had no other major abdominal operations. An NG tube has been passed by the Emergency Room physicians, which demonstrates feculent output. Approximately 700 mL is in the canister on my arrival.   ALLERGIES: None.   HOME MEDICATIONS: 1.  Actos 15 mg once a day.  2.  Enteric-coated aspirin 81 mg by mouth once a day.  3.  Detrol LA 4 mg by mouth once a day.  4.  Glipizide 10 mg by mouth once a day.  5.  Hydrochlorothiazide 25 mg by mouth once a day.  6.  Jalyn 0.5 - 0.4 mg capsule 1 caplet by mouth at bedtime.  7.  Januvia 100 mg by mouth once a day.  8.  Lovastatin 40 mg by mouth once a day.  9.  Meloxicam 7.5 mg by mouth b.i.d.  10.  MiraLAX.  11.  Mometasone 0.1% topical cream, apply to affected areas.  12.  Omeprazole 40 mg by mouth once a day.  13.  Percocet as needed.  14.  Ramipril 10 mg by mouth once a day.  15.  Tramadol 50 mg by mouth 3 times a day as needed for pain.  16.  Vitamin D3.   PAST MEDICAL HISTORY: Significant for hypertension, type 2 diabetes, chronic constipation, history of emphysema, history of TIA, history of back surgery.    PAST SURGICAL HISTORY: Cholecystectomy, back surgery, colonoscopies.   FAMILY HISTORY: Noncontributory.   REVIEW OF SYSTEMS:  Unable to be obtained due to mild dementia.   PHYSICAL EXAMINATION: GENERAL: The patient has got a nasogastric tube in place. He is alert. History is rather poor from the patient.  VITAL SIGNS: Temperature is 98.7, pulse 110. Blood pressure was 110/70.  LUNGS: Clear.  HEART: Tachycardic. No murmur.  ABDOMEN: Mildly distended and tympanitic with active bowel sounds. There are multiple scars. There are no obvious inguinal or femoral or incisional hernias. No umbilical hernia is present. There are no peritoneal signs.   The patient's abdomen is tender to palpation.  EXTREMITIES: Warm and well perfused. There is a chronic ulcer on the right lower extremity with a bandage on it.  RECTAL AND GENITOURINARY: Deferred. Foley catheter was in place.  NEUROLOGIC: The patient is alert, mildly demented.   LABORATORY VALUES: Glucose 268, BUN 51, creatinine 4.17, sodium 138, potassium 4.4, CO2 of 22, lipase 195, magnesium 1.5. Liver function tests are normal. Troponin negative. White count is 4.2, hemoglobin 12.6, platelet count is 329,000. Pro time is 15.4. Blood culture was obtained through the Emergency Room. Urinalysis negative. Blood gas on room air 7.38, CO2 of 36, PaO2 of 65, base excess of -3.3, bicarb is 21.3. Review of CT scan demonstrates a  transition point within the mid ileum with marked markedly dilated proximal bowel, dilated stomach with food debris. There is a mild amount of free fluid seen around the hepatic area. There is no obvious mass. No obvious hernia. There is no pneumatosis intestinalis.   IMPRESSION:  1.  Small bowel obstruction. likely adhesive in etiology. 2.  Acute renal failure secondary to dehydration.  3.  Hypertension.  4.  Diabetes mellitus type 2.  5.  History of chronic obstructive pulmonary disease.  6.  Chronically low PaO2 on room air, but  no signs of acidosis or CO2 retention.   PLAN: The patient will be admitted, aggressively hydrated, Foley catheter and nasogastric tube will be continued. A second IV will be obtained. The patient will be transferred to the intensive care unit for further resuscitation. I will obtain a medicine consult for assistance in management of his blood pressure, diabetes and preoperative evaluation.     ____________________________ Jeannette How Marina Gravel, MD mab:dmm D: 04/22/2013 09:18:56 ET T: 04/22/2013 09:37:22 ET JOB#: [id number]  cc: Elta Guadeloupe A. Marina Gravel, MD, <Dictator> Hortencia Conradi MD ELECTRONICALLY SIGNED 04/26/2013 17:06

## 2014-11-06 NOTE — Consult Note (Signed)
PATIENT NAME:  Alan Mcintyre, Alan Mcintyre MR#:  [id number] DATE OF BIRTH:  January 12, 1935  DATE OF CONSULTATION:  08/15/2013  CONSULTING PHYSICIAN:  Larkin Ina A. Vickki Muff, DPM  REASON FOR CONSULTATION: Right heel ulceration.   HISTORY OF PRESENT ILLNESS: This is a 80 year old gentleman admitted for recent systemic inflammatory response syndrome. He had a noted heel ulcer wound on his right foot upon admission, yesterday. He had been complaining of worsening heel pain recently. He is at a nursing facility at this time. Apparently he had been seen by the [facility] in the past for a heel ulceration.   PAST MEDICAL HISTORY: 1.  Acute encephalopathy.  2.  History of CVA. 3.  Type 2 diabetes.  4.  Acute renal failure. 5.  Malignant hypertension.   SOCIAL HISTORY: He lives in a nursing home in [place]. He has 2 sons and 2 daughters. Is a former Retail buyer.   REVIEW OF SYSTEMS:  The patient is not a great historian for review of systems. He complains of right heel pain. He denies any numbness at this time.   PHYSICAL EXAMINATION: GENERAL: He is alert and oriented.  VASCULAR: He has nonpalpable dorsalis pedis and posterior tibial pulses.  NEUROLOGIC: He has some noted neuropathy to the great toe and forefoot region. It seems to be intact from the midfoot to the heel although not complete sensation, as the heel ulceration is not extremely painful at this time.  DERMATOLOGIC:  On his left foot, there are no open ulcerations at all. On his right foot, there is a small superficial abrasion on the anterior aspect of his ankle joint region. No open draining at this point and this is dry and healing up nicely. On the posterior aspect of his right heel, he has a 3 cm central eschar, decubitus type of ulcer. There was surrounding blister material an extra 2 cm from the periphery of the eschar. I debrided this down and removed all of the fluid and draining material in this region. The underlying skin was irritated.  Superficial erythematous preulcerative type of skin at this time. There is no area of purulence. There were no  probing areas. There was no fluctuance or foul odor to the region.  MUSCULOSKELETAL: He does have pain to the heel region. He has mild diffuse bilateral foot and ankle edema. X-rays do not show any signs of osteomyelitis to the right heel with no cortical irregularities.   ASSESSMENT: Likely heel pressure decubitus ulcer.  PLAN:   1.  I debrided away this superficially the surrounding skin with a 15 blade and tissue nippers. It did not penetrate the dermal layer. This was a 5 cm excisional type of debridement. The wound was then cleansed away and an Allevyn heel pressure reducing dressing was applied. This will be changed every other day by nursing.  2.  Evaluation by Vascular Surgery would be warranted though not emergent and if the patient is in the hospital through early next week, we could consult Vascular but this could be made in an outpatient-type of setting. He will likely need continued chronic wound care which can be performed in the office setting. For now, we will place him in pressure reducing boots, have local wound care performed to the area. This was not infected and is likely not the source of his SIRS. At this point, we can follow up early next week if patient is still in the hospital, otherwise have him follow-up with my clinic in the next 2 weeks in the  outpatient setting.     ____________________________ Pete Glatter. Vickki Muff, DPM jaf:dp D: 08/15/2013 14:59:46 ET T: 08/15/2013 15:33:09 ET JOB#: [id number]  cc: Larkin Ina A. Vickki Muff, DPM, <Dictator> Avelino Herren DPM ELECTRONICALLY SIGNED 09/09/2013 19:45

## 2014-11-06 NOTE — Consult Note (Signed)
CHIEF COMPLAINT and HISTORY:  Subjective/Chief Complaint heel ulcers   History of Present Illness Patient from SNF who Dr. Delana Meyer has previously treated for PAD is admitted for ARF and MS changes.  He is a very poor historian and really does not provide any history.  He is bedbound and non-ambulatory.   He has chronic heel ulcers bilaterally and has been seen by Podiatry and is not felt to be likely to benefit from surgery at this time. Had RLE revascularization earlier this year by Dr. Delana Meyer and was indications that LLE may need to be treated as well.   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   cholecystectomy:    Abdominal surgery: 06-May-2013   Back Fusion:   ALLERGIES:  Allergies:  No Known Allergies:   HOME MEDICATIONS:  Home Medications: Medication Instructions Status  aspirin 81 mg oral tablet, chewable 1 tab(s) orally once a day Active  traMADol 50 mg oral tablet 0.5 tab(s) orally every 4 hours, As Needed, pain , As needed, pain Active  Vitamin D3 2000 intl units oral tablet 1 tab(s) orally once a day Active  pantoprazole 40 mg oral delayed release tablet 1 tab(s) orally once a day Active  tamsulosin 0.4 mg oral capsule 1 cap(s) orally once a day Active  Avodart 0.5 mg oral capsule 1 cap(s) orally once a day Active  Probiotic Formula - oral capsule 1 cap(s) orally once a day Active  multivitamin 1 tab(s) orally once a day Active  MiraLax - oral powder for reconstitution 1 capful (17 grams) in 8oz of water and drink orally 2 times a day Active  Levemir 100 units/mL subcutaneous solution 28 unit(s) subcutaneous once a day (in the morning) and 20 units subcutaneous once a day (in  the evening) Active  gabapentin 300 mg oral capsule 1 cap(s) orally 2 times a day Active  Metoprolol Tartrate 50 mg oral tablet 1 tab(s) orally 2 times a day Active  metoclopramide 10 mg oral tablet 1 tab(s) orally 3 times a day (with meals) Active  NovoLOG 100 units/mL subcutaneous solution sliding scale subcutaneous as needed Active  docusate sodium 100 mg oral capsule 1 cap(s) orally 2 times a day Active  Santyl 250 units/g topical ointment Apply topically to wound once a day Active  amLODIPine 5 mg oral tablet 1 tab(s) orally once a day Active  Micro-Guard 2% topical powder Apply topically to  groin 2 times a day Active  clopidogrel 75 mg oral tablet 1 tab(s) orally once a day Active  magnesium oxide 400 mg oral tablet 1 tab(s) orally once a day Active  Tylenol 500 mg oral tablet 2 tabs ($Remov'1000mg'[id number]$ ) orally every 4 hours as needed Active   Family and Social History:  Family History Non-Contributory   Social History positive tobacco (Greater than 1 year), positive ETOH, positive Illicit drugs, remote history of polysubstance abuse.  Has been in SNF for 5 years now   Place of Living Nursing Home   Review of Systems:  ROS Pt not able to provide ROS   Medications/Allergies Reviewed Medications/Allergies reviewed   Physical Exam:  GEN well developed, well nourished,  chronically ill appearing   HEENT pink conjunctivae, moist oral mucosa   NECK No masses  trachea midline   RESP normal resp effort  no use of accessory muscles   CARD regular rate  no JVD   VASCULAR ACCESS none   ABD denies tenderness  soft   GU no superpubic tenderness   LYMPH negative neck, negative axillae   EXTR 1+ right DP pulse, no right PT pulse.  Left pedal pulses not easily palpable.  Feet warm with decent cap refill   SKIN positive ulcers, bilateral heel ulcers, chronic and dressed   NEURO week and poorly responsive.  Does not really follow commands   PSYCH stuporous   LABS:  Laboratory  Results: Hepatic:    26-Jun-15 17:26, Comprehensive Metabolic Panel  Bilirubin, Total 0.2  Alkaline Phosphatase 93  45-117  NOTE: New Reference Range  06/05/13  SGPT (ALT) 38  SGOT (AST) 47  Total Protein, Serum 7.0  Albumin, Serum 3.0  Routine Micro:    26-Jun-15 22:01, Urine Culture  Micro Text Report   URINE CULTURE    COMMENT                   MIXED BACTERIAL ORGANISMS    COMMENT                   RESULTS SUGGESTIVE OF CONTAMINATION     ANTIBIOTIC  Specimen Source   CLEAN CATCH  Culture Comment   MIXED BACTERIAL ORGANISMS  Culture Comment .   RESULTS SUGGESTIVE OF CONTAMINATION   Result(s) reported on 10 Jan 2014 at 11:14AM.  Routine Chem:    26-Jun-15 17:26, Comprehensive Metabolic Panel  Glucose, Serum 129  BUN 37  Creatinine (comp) 2.76  Sodium, Serum 135  Potassium, Serum 5.9  Chloride, Serum 106  CO2, Serum 22  Calcium (Total), Serum 8.7  Osmolality (calc) 280  eGFR (African American) 25  eGFR (Non-African American) 21  eGFR values <54mL/min/1.73 m2 may be an indication of chronic  kidney disease (CKD).  Calculated eGFR is useful in patients with stable renal function.  The eGFR calculation will not be reliable in acutely ill patients  when serum creatinine is changing rapidly. It is not useful in   patients on dialysis. The eGFR calculation may not be applicable  to patients at the low and high extremes of body sizes, pregnant  women, and vegetarians.  Anion Gap 7    27-Jun-15 30:16, Basic Metabolic Panel (w/Total Calcium)  Glucose, Serum 128  BUN 36  Creatinine (comp) 2.65  Sodium, Serum 140  Potassium, Serum 4.7  Chloride, Serum 110  CO2, Serum 23  Calcium (Total), Serum 8.3  Anion Gap 7  Osmolality (calc) 289  eGFR (African American) 26  eGFR (Non-African American) 22  eGFR values <63mL/min/1.73 m2 may be an indication of chronic  kidney disease (CKD).  Calculated eGFR is useful in patients with stable renal function.  The eGFR calculation  will not be reliable in acutely ill patients  when serum creatinine is changing rapidly. It is not useful in   patients on dialysis. The eGFR calculation may not be applicable  to patients at the low and high extremes of body sizes, pregnant  women, and vegetarians.    27-Jun-15 04:13, Magnesium, Serum  Magnesium, Serum 1.8  1.8-2.4  THERAPEUTIC RANGE: 4-7 mg/dL  TOXIC: > 10 mg/dL   -----------------------    28-Jun-15 01:09, Basic Metabolic Panel (w/Total Calcium)  Glucose, Serum 103  BUN 31  Creatinine (comp) 2.46  Sodium, Serum 142  Potassium, Serum 4.5  Chloride, Serum 113  CO2, Serum 23  Calcium (Total), Serum 8.7  Anion Gap 6  Osmolality (calc) 290  eGFR (African American) 28  eGFR (Non-African American) 25  eGFR values <61mL/min/1.73 m2 may be an indication of chronic  kidney disease (CKD).  Calculated eGFR is useful in patients with stable renal function.  The eGFR calculation will not be reliable in acutely ill patients  when serum creatinine is changing rapidly. It is not useful in   patients on dialysis. The eGFR calculation may not be applicable  to patients at the low and high extremes of body sizes, pregnant  women, and vegetarians.    29-Jun-15 30:94, Basic Metabolic Panel (w/Total Calcium)  Glucose, Serum 86  BUN 30  Creatinine (comp) 2.00  Sodium, Serum 140  Potassium, Serum 4.3  Chloride, Serum 111  CO2, Serum 24  Calcium (Total), Serum 9.2  Anion Gap 5  Osmolality (calc) 285  eGFR (African American) 36  eGFR (Non-African American) 31  eGFR values <23mL/min/1.73 m2 may be an indication of chronic  kidney disease (CKD).  Calculated eGFR is useful in patients with stable renal function.  The eGFR calculation will not be reliable in acutely ill patients  when serum creatinine is changing rapidly. It is not useful in   patients on dialysis. The eGFR calculation may not be applicable  to patients at the low and high extremes of body sizes,  pregnant  women, and vegetarians.  Cardiac:    26-Jun-15 17:26, Troponin I  Troponin I < 0.02  0.00-0.05  0.05 ng/mL or less: NEGATIVE   Repeat testing in 3-6 hrs   if clinically indicated.  >0.05 ng/mL: POTENTIAL   MYOCARDIAL INJURY. Repeat   testing in 3-6 hrs if   clinically indicated.  NOTE: An increase or decrease   of 30% or more on serial   testing suggests a   clinically important change  Routine UA:    26-Jun-15 17:26, Urinalysis  Color (UA) Yellow  Clarity (UA) Clear  Glucose (UA) Negative  Bilirubin (UA) Negative  Ketones (UA) Negative  Specific Gravity (UA) 1.012  Blood (UA) Negative  pH (UA) 5.0  Protein (UA)   100 mg/dL  Nitrite (UA) Negative  Leukocyte Esterase (UA) Negative  Result(s) reported on 08 Jan 2014 at 05:47PM.  RBC (UA) <1 /HPF  WBC (UA) <1 /HPF  Bacteria (UA)   NONE SEEN  Epithelial Cells (UA)   NONE SEEN  Hyaline Cast (UA) 4 /LPF  Result(s) reported on 08 Jan 2014 at 05:47PM.  Routine Sero:    28-Jun-15 23:44, Occult Blood, Feces  Occult Blood, Feces POSITIVE  Result(s) reported on 11 Jan 2014 at 03:13AM.  Routine Hem:    26-Jun-15 17:26, Hemogram, Platelet Count  WBC (CBC) 7.9  RBC (CBC) 2.85  Hemoglobin (CBC) 8.1  Hematocrit (CBC) 24.4  Platelet Count (CBC) 350  Result(s) reported on 08 Jan 2014 at 06:00PM.  MCV 86  MCH 28.3  MCHC 33.2  RDW 17.9    27-Jun-15 04:13, CBC Profile  WBC (CBC) 7.1  RBC (CBC) 2.69  Hemoglobin (CBC) 7.7  Hematocrit (CBC) 22.4  Platelet Count (CBC) 333  MCV 83  MCH 28.5  MCHC 34.1  RDW 19.8  Neutrophil % 49.9  Lymphocyte % 30.4  Monocyte % 13.5  Eosinophil % 5.1  Basophil % 1.1  Neutrophil # 3.5  Lymphocyte # 2.2  Monocyte # 1.0  Eosinophil # 0.4  Basophil # 0.1  Result(s) reported on 09 Jan 2014 at 05:11AM.    28-Jun-15 04:31, Hemoglobin  Hemoglobin (CBC) 7.1  Result(s) reported on 10 Jan 2014 at 05:33AM.    29-Jun-15 04:12, Hemoglobin  Hemoglobin (CBC) 7.7  Result(s) reported on  11 Jan 2014 at 04:54AM.   RADIOLOGY:  Radiology Results: XRay:    07-Oct-14 08:45, Chest Portable Single View  Chest Portable Single View  REASON FOR EXAM:    sepsis  COMMENTS:       PROCEDURE: DXR - DXR PORTABLE CHEST SINGLE VIEW  - Apr 21 2013  8:45AM     RESULT: The lungs are clear. The heart and pulmonary vessels are normal.   The bony and mediastinal structures are unremarkable. There is no   effusion. There is no pneumothorax or evidence of congestive failure.    IMPRESSION:  No acute cardiopulmonary disease.    Dictation Site: 2        Verified By: Sundra Aland, M.D., MD    08-Oct-14 13:14, Abdomen Flat and Erect  Abdomen Flat and Erect  REASON FOR EXAM:    sbo  COMMENTS:       PROCEDURE: DXR - DXR ABDOMEN 2 V FLAT AND ERECT  - Apr 22 2013  1:14PM     RESULT: Comparisons:  04/21/2013-    Findings:      Supine and upright views of the abdomen are provided.    There is persistent small bowel dilatation measuring up to 5 cm. There is   a small amount of air seen in the rectum. The appearance is consistent   with persistent small bowel obstruction. There is a nasogastric tube   present coiled in the stomach. There is no pathologic calcification along     the expected course of the ureters. There is no evidence of   pneumoperitoneum, portal venous gas, or pneumatosis.    The osseous structures are unremarkable.    IMPRESSION:     Small bowel obstruction.    Dictation Site: 1        Verified By: Jennette Banker, M.D., MD    09-Oct-14 08:12, Abdomen Flat and Erect  Abdomen Flat and Erect  REASON FOR EXAM:    psbo vs sbo  COMMENTS:       PROCEDURE: DXR - DXR ABDOMEN 2 V FLAT AND ERECT  - Apr 23 2013  8:12AM     RESULT: Comparison is made to images dated 04/22/2013. A nasogastric tube   is present with the tip in the stomach. Distended loops of small bowel   are present. There is some air in the colon. Severe degenerative changes   are seen in the  lower lumbar spine.    IMPRESSION:  Persistent changes of distended loops of small bowel   concerning for obstruction. CT of the abdomen and pelvis may be   beneficial for further assessment.    Dictation Site: 2    Verified By: Sundra Aland, M.D., MD    10-Oct-14 11:13, Abdomen Flat and Erect  Abdomen Flat and Erect  REASON FOR EXAM:    sbo  COMMENTS:       PROCEDURE: DXR - DXR ABDOMEN 2 V FLAT AND ERECT  - Apr 24 2013 11:13AM     RESULT: Comparison is made images of 04/23/2013. There is significant   gastric distention with an air-fluid level present. Multiple distended   loops of small bowel with air-fluid levels are present. There  is   scattered air and fecal material in the colon. Findings concerning for   bowel obstruction in the small bowel. The patient may benefit from a   nasogastric tube. No free air or pneumatosis is appreciated.    IMPRESSION:   1. Small bowel obstruction.  Dictation Site: 2        Verified By: Sundra Aland, M.D., MD    12-Oct-14 09:48, Ankle Right AP and Lateral  Ankle Right AP and Lateral  REASON FOR EXAM:    pain in achilles region, post ankle  COMMENTS:       PROCEDURE: DXR - DXR ANKLE RIGHT AP AND LATERAL  - Apr 26 2013  9:48AM     RESULT: Findings: 2 views of the right ankle are provided. There is no   fracture. Is no dislocation. The soft tissues are unremarkable. There   degenerative changes of the midfoot.    IMPRESSION:     No acute osseous injury of the right ankle.        Verified By: Jennette Banker, M.D., MD    14-Oct-14 15:35, Chest Portable Single View  Chest Portable Single View  REASON FOR EXAM:    PICC Line Placement  COMMENTS:       PROCEDURE: DXR - DXR PORTABLE CHEST SINGLE VIEW  - Apr 28 2013  3:35PM     RESULT: Comparison is made to the study of April 21, 2013.    The lungs are hypoinflated. There are bibasilar densities consistent with   atelectasis. A PICC line has been placed on the right with  the tip in the   region of the midportion of the SVC. An esophagogastric tube is in place   whose tip projects off the film. The cardiac silhouette is not enlarged.   There is tortuosity of the descending thoracic aorta. There is no   pulmonary vascular congestion.    IMPRESSION:   1. The PICC line appears to be in reasonable position.  2. There is bibasilar atelectasis.     Dictation Site: 2        Verified By: DAVID A. Martinique, M.D., MD    14-Oct-14 19:04, Abdomen Flat and Erect  Abdomen Flat and Erect  REASON FOR EXAM:    Ileus  COMMENTS:       PROCEDURE: DXR - DXR ABDOMEN 2 V FLAT AND ERECT  - Apr 28 2013  7:04PM     RESULT: Of right and supine images of the abdomen are reviewed and   compared to study of April 24, 2013.    There is bibasilar atelectasis of the lungs. There is a nasogastric tube   in place whose proximal port and tip lies in the region of the mid to   distal stomach. There are loops of moderately distended gas-filled small   bowel in the mid abdomen. The volume of gas has decreased somewhat since   10 October. There is no significant gas in the rectum. Small amounts are   noted in the right colon.  IMPRESSION:  The findings are consistent with a partial mid to distal   small bowel obstruction. There is no evidence of perforation.     Dictation Site: 5        Verified By: DAVID A. Martinique, M.D., MD    19-Oct-14 10:38, Chest PA and Lateral  Chest PA and Lateral  REASON FOR EXAM:    SOB  COMMENTS:       PROCEDURE: DXR -  DXR CHEST PA (OR AP) AND LATERAL  - May 03 2013 10:38AM     RESULT: Comparison is made to the study of 04/28/2013. Continued areas of   density are seen in the lung bases. There is hypoinflation. The heart is   within normal limits for size. Right upper extremity PIC line terminates   in the right atrium.    IMPRESSION:  Persistent basilar opacities with hypoinflation. PICC line   remains unchanged.    Dictation Site:  6    Verified By: Sundra Aland, M.D., MD    20-Oct-14 11:47, Abdomen 3 Way Includes PA Chest  Abdomen 3 Way Includes PA Chest  REASON FOR EXAM:    POD 10 SBO, passing flatus  COMMENTS:       PROCEDURE: DXR - DXR ABDOMEN 3-WAY (INCL PA CXR)  - May 04 2013 11:47AM     RESULT: The lungs are hypoinflated. There is atelectasis at the right   lung base. A PICC catheter on the right is present with the tip in the   region of the distal SVC. Within the abdomen there is a moderate amount   of gas within the stomach. There is gas within the colon and there are   small amounts of small bowel gas. No free extraluminal gas collections   are demonstrated. There are surgical clips in the gallbladder fossa and   in the midline of the mid and lower abdomen. The bony structures exhibit   no acute abnormalities.    IMPRESSION:   1. There is bibasilar atelectasis.  2. The bowel gas pattern is relatively nonspecific. There is no evidence   of obstruction currently. No free extraluminal gas collections are   demonstrated.     Dictation Site: 2        Verified By: DAVID A. Martinique, M.D., MD    21-Oct-14 20:13, Chest Portable Single View  Chest Portable Single View  REASON FOR EXAM:    wheeze  COMMENTS:       PROCEDURE: DXR - DXR PORTABLE CHEST SINGLE VIEW  - May 05 2013  8:13PM     RESULT: Comparison: 05/03/2013    Findings:     Single portable AP chest radiograph is provided.  There is a right-sided   PICC line with the tip projecting over the right atrium. There is a small   amount of right pleural fluid. There is bibasilar atelectasis. There is   no pneumothorax. Normal cardiomediastinal silhouette. The osseous   structures are unremarkable.  IMPRESSION:    Small right pleural effusion. Bibasilar atelectasis.    Dictation Site: 1        Verified By: Jennette Banker, M.D., MD    [id number] 20:10, Chest Portable Single View  Chest Portable Single View  REASON FOR EXAM:     swelling, lower ext - assess for edema  COMMENTS:       PROCEDURE: DXR - DXR PORTABLE CHEST SINGLE VIEW  - May 30 2013  8:10PM     CLINICAL DATA:  Swelling lower extremities.  Assess for edema.    EXAM:  PORTABLE CHEST - 1 VIEW    COMPARISON:  05/05/2013    FINDINGS:  The heart is mildly enlarged. There is elevation of the right  hemidiaphragm. Streaky/patchy density identified at the right lung  base, similar in appearance to prior study. Findings are consistent  with atelectasis and/or infiltrate. There has been improvement in  aeration of the left lung base. Right PICC  line has been removed. No  new edema.     IMPRESSION:  1. No evidence for pulmonary edema.  2. Persistent right lower lobe atelectasis and or infiltrate.  3. Improved left lower lobe infiltrate.      Electronically Signed    By: Shon Hale M.D.    On: 05/30/2013 20:35     Verified By: Glenice Bow, M.D.,    05-Dec-14 16:11, Abdomen 3 Way Includes PA Chest  Abdomen 3 Way Includes PA Chest  REASON FOR EXAM:    Pain  COMMENTS:   May transport without cardiac monitor    PROCEDURE: DXR - DXR ABDOMEN 3-WAY (INCL PA CXR)  - Jun 19 2013  4:11PM     CLINICAL DATA:  Fever and hypertension.  Pain.    EXAM:  ACUTE ABDOMEN SERIES (2 VIEW ABDOMEN AND 1 VIEW CHEST)    COMPARISON:  Abdominal series 04/28/2013.    FINDINGS:  Mediastinum and hilar structures normal. The lungs are clear. No  pleural effusion or pneumothorax. Heart size and pulmonary  vascularity normal.    Surgical clips right upperquadrant consistent with prior  cholecystectomy. Chain sutures are noted in the right upper abdomen.  Soft tissue structures are unremarkable. Aortoiliac atherosclerotic  vascular calcification present. Gas pattern is nonspecific. No free  air is noted. Previously identified small bowel distention noted on  prior abdominal series of 04/28/2013 is no longer present.  Degenerative changes noted lumbar spine and  both hips.     IMPRESSION:  Nonspecific exam.  No focal abnormality identified.      Electronically Signed    By: Marcello Moores  Register    On: 06/19/2013 16:18         Verified By: Osa Craver, M.D., MD    06-Dec-14 06:26, Chest Portable Single View  Chest Portable Single View  REASON FOR EXAM:    sepsis  COMMENTS:       PROCEDURE: DXR - DXR PORTABLE CHEST SINGLE VIEW  - Jun 20 2013  6:26AM     CLINICAL DATA:  Sepsis.    EXAM:  PORTABLE CHEST - 1 VIEW    COMPARISON:  05/30/2013    FINDINGS:  The heart size and mediastinal contours are within normal limits.  Both lungs are clear. The visualized skeletal structures are  unremarkable.     IMPRESSION:  Normal chest.      Electronically Signed    By: Rozetta Nunnery M.D.    On: 06/20/2013 08:01         Verified By: Larey Seat, M.D.,    08-Dec-14 11:13, Foot Left AP and Lateral  Foot Left AP and Lateral  REASON FOR EXAM:    heel ulcer. Fever. Osteomyelitis?  COMMENTS:       PROCEDURE: DXR - DXR FOOT LEFT AP AND LATERAL  - Jun 22 2013 11:13AM     CLINICAL DATA:  Heel ulcer    EXAM:  LEFT FOOT - 2 VIEW    COMPARISON:  None.    FINDINGS:  Negative for fracture. Negative for osteomyelitis. No cortical  erosion.   IMPRESSION:  Negative.      Electronically Signed    By: Franchot Gallo M.D.    On: 06/22/2013 11:15         Verified By: Truett Perna, M.D.,    08-Dec-14 11:13, Foot Right AP and Lateral  Foot Right AP and Lateral  REASON FOR EXAM:    Right heel ulcer.  fever. Osteomyelitis  COMMENTS:       PROCEDURE: DXR - DXR FOOT RIGHT AP AND LATERAL  - Jun 22 2013 11:13AM     CLINICAL DATA:  Heel ulcer bilaterally.    EXAM:  RIGHT FOOT - 2 VIEW    COMPARISON:  None.    FINDINGS:  Negative for fracture. Negative for osteomyelitis. Degenerative  change in the 1st metatarsal filling healed joint. No erosion.   IMPRESSION:  No acute abnormality.      Electronically Signed    By:  Franchot Gallo M.D.    On: 06/22/2013 11:14         Verified By: Truett Perna, M.D.,    30-Jan-15 18:27, Chest PA and Lateral  Chest PA and Lateral  REASON FOR EXAM:    fever  COMMENTS:       PROCEDURE: DXR - DXR CHEST PA (OR AP) AND LATERAL  - Aug 14 2013  6:27PM     CLINICAL DATA:  Fever    EXAM:  CHEST  2 VIEW    COMPARISON:  06/20/2013    FINDINGS:  The heart size and mediastinal contours arewithin normal limits.  Both lungs are clear. The visualized skeletal structures are  unremarkable.     IMPRESSION:  No active cardiopulmonary disease.      Electronically Signed    By: Skipper Cliche M.D.    On: 08/14/2013 18:31         Verified NF:[id number] C. RUBNER, M.D.,    30-Jan-15 21:04, Foot Right Complete  Foot Right Complete  REASON FOR EXAM:    heel wound, r/o osteo  COMMENTS:       PROCEDURE: DXR - DXR FOOT RT COMPLETE W/OBLIQUES  - Aug 14 2013  9:04PM     CLINICAL DATA:  Soft tissue wound involving the right heel. Evaluate  for underlying osteomyelitis.    EXAM:  RIGHTFOOT COMPLETE - 3+ VIEW    COMPARISON:  06/22/2013.    FINDINGS:  Soft tissue swelling involving the entire foot, more so than on the  prior examination. No evidence of osteomyelitis involving the  calcaneus or elsewhere. Mild hallux valgus. Mild joint space  narrowing involving the first MTP joint without evidence of  erosions. Mild joint space narrowing involving the talonavicular  joint.     IMPRESSION:  No acute osseous abnormality. No evidence of osteomyelitis. 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      Electronically Signed    By: Evangeline Dakin M.D.    On: 08/14/2013 21:06     Verified By: Deniece Portela, M.D.,    06-Feb-15 12:33, Chest Portable Single View  Chest Portable Single View  REASON FOR EXAM:    unresponsive  COMMENTS:       PROCEDURE: DXR - DXR PORTABLE CHEST SINGLE VIEW  - Aug 21 2013 12:33PM      CLINICAL DATA:  Unresponsive    EXAM:  PORTABLE CHEST - 1 VIEW    COMPARISON:  Prior chest x-ray 08/14/2013    FINDINGS:  Stable cardiac and mediastinal contours. The lungs are clear. No  pleural effusion or pneumothorax. No pulmonary edema. No acute  osseous abnormality.     IMPRESSION:  No active disease.      Electronically Signed    By: Jacqulynn Cadet M.D.    On: 08/21/2013 12:36         Verified By: Criselda Peaches, M.D.,  06-Feb-15 18:11, Chest Portable Single View  Chest Portable Single View  REASON FOR EXAM:    central line placement  COMMENTS:       PROCEDURE: DXR - DXR PORTABLE CHEST SINGLE VIEW  - Aug 21 2013  6:11PM     CLINICAL DATA:  Central line placement.    EXAM:  PORTABLE CHEST - 1 VIEW    COMPARISON:  DG CHEST 1V PORT dated 08/21/2013    FINDINGS:  Trachea is midline. Heart size stable. Left IJ central line tip  projects over the SVC. Lungs are somewhat low in volume with minimal  bibasilar volume loss. No pneumothorax. No pleural fluid.     IMPRESSION:  1. Left IJ central line placement without pneumothorax.  2. Mild bibasilar volume loss.      Electronically Signed    By: Lorin Picket M.D.    On: 08/21/2013 18:16         Verified By: Luretha Rued, M.D.,    05-Mar-15 09:11, Chest Portable Single View  Chest Portable Single View  REASON FOR EXAM:    Altered Mental Status  COMMENTS:       PROCEDURE: DXR - DXR PORTABLE CHEST SINGLE VIEW  - Sep 17 2013  9:11AM     CLINICAL DATA:  Altered mental status and hypoglycemia    EXAM:  PORTABLE CHEST - 1 VIEW    COMPARISON:  February 6,2015    FINDINGS:  Lungs are clear. Heart size and pulmonary vascularity are normal. No  adenopathy. No bone lesions . Marland Kitchen   IMPRESSION:  No edema or consolidation.      Electronically Signed    By: Lowella Grip M.D.    On: 09/17/2013 09:17         Verified By: Leafy Kindle. WOODRUFF, M.D.,    10-Mar-15 08:53, Chest Portable  Single View  Chest Portable Single View  REASON FOR EXAM:    Chest Pain  COMMENTS:       PROCEDURE: DXR - DXR PORTABLE CHEST SINGLE VIEW  - Sep 22 2013  8:53AM     CLINICAL DATA:  Stroke symptoms, chest pain    EXAM:  PORTABLE CHEST - 1 VIEW    COMPARISON:  DG CHEST 1V PORT dated 09/17/2013; DG CHEST 1V PORT dated  08/21/2013; DG CHEST 1V PORT dated 08/21/2013    FINDINGS:  The heart size and mediastinal contours are within normal limits.  Both lungs are clear. The visualized skeletal structures are  unremarkable.     IMPRESSION:  No active disease.      Electronically Signed    By: Skipper Cliche M.D.    On: 09/22/2013 08:54         Verified By: Rachael Fee, M.D.,    28-Mar-15 13:40, Chest Portable Single View  Chest Portable Single View  REASON FOR EXAM:    weak  COMMENTS:       PROCEDURE: DXR - DXR PORTABLE CHEST SINGLE VIEW  - Oct 10 2013  1:40PM     CLINICAL DATA:  Weakness.    EXAM:  PORTABLE CHEST - 1 VIEW    COMPARISON:  09/22/2013    FINDINGS:  The heart size and mediastinal contours are within normal limits.  Both lungs are clear. The visualized skeletal structures are  unremarkable.     IMPRESSION:  No active disease.      Electronically Signed    By: Rolm Baptise M.D.    On: 10/10/2013  13:50         Verified By: Raelyn Number, M.D.,    28-Mar-15 13:40, Knee Left Complete  Knee Left Complete  REASON FOR EXAM:    red swollen  COMMENTS:       PROCEDURE: DXR - DXR KNEE LT COMP WITH OBLIQUES  - Oct 10 2013  1:40PM     CLINICAL DATA:  Left knee pain, swelling.  Arthritis.    EXAM:  LEFT KNEE - COMPLETE 4+ VIEW    COMPARISON:  08/15/2013    FINDINGS:  Advanced tricompartment degenerative changes within the left knee,  most pronounced in the medial and patellofemoral compartments.  Severe joint space narrowing. Osteophyte formation. Moderate to  large joint effusion.    Mild anterior soft tissue swelling.    Findings are similar  to prior study.     IMPRESSION:  Advanced tricompartment degenerative changes. Moderate to large  joint effusion with anterior soft tissue swelling. No acute  findings. No change.      Electronically Signed    By: Rolm Baptise M.D.    On: 10/10/2013 13:52         Verified By: Raelyn Number, M.D.,    24-Jun-15 12:23, Heel - Calcaneous Right  Heel - Calcaneous Right  REASON FOR EXAM:    Osteomyelitis  COMMENTS:       PROCEDURE: DXR - DXR CALCANEOUS - HEEL RIGHT  - Jan 06 2014 12:23PM     CLINICAL DATA:  Osteomyelitis.    EXAM:  DG HEEL 2V RIGHT    COMPARISON:  08/14/2013.    FINDINGS:  Soft tissue defect is notedalong the calcaneus on the right.  Extremely subtle cortical erosion of the posterior aspect of the  calcaneus cannot be excluded. Subtle osteomyelitis could present in  this fashion. No other focal abnormality. No evidence of fracture.  No radiopaque foreign body.     IMPRESSION:  Large soft tissue defect noted along the posterior soft tissues of  the calcaneus. Associated extremity subtle erosion of the distal  most aspect of the calcaneal cortex noted. This suggests possibility  of very mild changes of osteomyelitis.      Electronically Signed    By: Marcello Moores  Register    On: 01/06/2014 14:41     Verified By: Osa Craver, M.D., MD    24-Jun-15 12:23, Heel (Calcaneous) Left  Heel (Calcaneous) Left  REASON FOR EXAM:    Osteomyelitis  COMMENTS:       PROCEDURE: DXR - DXR CALCANEUOUS-HEEL LEFT  - Jan 06 2014 12:23PM     CLINICAL DATA:  Osteomyelitis    EXAM:  DG HEEL 2V LEFT    COMPARISON:  None.    FINDINGS:  There is soft tissue lucency and deformity consistent with known  infection. There is no underlying osteopenia or bony destruction to  suggest osteomyelitis.     IMPRESSION:  No plain film evidence of osteomyelitis.      Electronically Signed    By: Abelardo Diesel M.D.    On: 01/06/2014 14:37         Verified By: Abelardo Diesel, M.D.,    26-Jun-15 17:38, Chest Portable Single View  Chest Portable Single View  REASON FOR EXAM:    Altered Mental Status  COMMENTS:       PROCEDURE: DXR - DXR PORTABLE CHEST SINGLE VIEW  - Jan 08 2014  5:38PM     CLINICAL DATA:  Weakness, emphysema, altered mental status.  EXAM:  PORTABLE CHEST - 1 VIEW    COMPARISON:  10/10/2013.    FINDINGS:  Trachea is midline. Heart size normal. Lungs are clear. No pleural  fluid.   IMPRESSION:  No acute findings.      Electronically Signed    By: Leanna Battles M.D.    On: 01/08/2014 17:54         Verified By: Reyes Ivan, M.D.,  [place]:    12-Oct-14 10:12, [place] Color Flow Doppler Low Extrem Bilat (Legs)  [place] Color Flow Doppler Low Extrem Bilat (Legs)  REASON FOR EXAM:    post surgical patient, calf and ankle pain  COMMENTS:       PROCEDURE: [place]  - US DOPPLER LOW EXTR BILATERAL  - Apr 26 2013 10:12AM     RESULT: Comparison: None    Findings: Multiple longitudinal and transverse gray-scale as well as  color and spectral Doppler images of  bilateral lower extremity veins   were obtained from the common femoral veins through the popliteal veins.    The right common femoral, greater saphenous, femoral, popliteal veins,   and venous trifurcation arepatent, demonstrating normal color-flow and   compressibility. No intraluminal thrombus is identified.There is normal   respiratory variation and augmentation demonstrated at all vein levels.   The left common femoral, greater saphenous, femoral, popliteal veins, and   venous trifurcation are patent, demonstrating normal color-flow and   compressibility. No intraluminal thrombus is identified.There is normal   respiratory variation and augmentation demonstrated at all vein levels.    IMPRESSION:    1. No evidence of DVT in the right lower extremity.  2. No evidence of DVT in the left lower extremity.    Dictation Site: 1        Verified By: Joellyn Haff, M.D., MD     06-Feb-15 15:18, [place] Color Flow Doppler Low Extrem Left (Leg)  [place] Color Flow Doppler Low Extrem Left (Leg)  REASON FOR EXAM:    Swelling  COMMENTS:       PROCEDURE: [place]  - US DOPPLER LOW EXTR LEFT  - Aug 21 2013  3:18PM     CLINICAL DATA:  Left lower extremity edema    EXAM:  LEFT LOWER EXTREMITY VENOUS DUPLEX ULTRASOUND    TECHNIQUE:  Gray-scale sonographywith graded compression, as well as color  Doppler and duplex ultrasound, were performed to evaluate the deep  venous system from the level of the common femoral vein through the  popliteal and proximal calf veins. Spectral Doppler was utilized to  evaluate flow at rest and with distal augmentation maneuvers.    COMPARISON:  April 26, 2013    FINDINGS:  Real-time and Doppler interrogation of the left lower extremity  venous system was performed. Flow in the venous structures of the  left lower extremity is spontaneous and phasic in all segments.  There is normal compression and augmentation in the venous  structures of the left lower extremity. Venous Doppler signal is  normal in all regions on the left. There is no thrombus in the deep  orvisualized superficial venous structures on the left. There is no  deep venous incompetence on the left. There is left lower extremity  soft tissue edema.   IMPRESSION:  Left lower extremity soft tissue edema. No evidence of deep venous  thrombosis in the left lower extremity.      Electronically Signed    By: Bretta Bang M.D.    On: 08/21/2013 15:20  Verified By: Leafy Kindle. Jasmine December, M.D.,    28-Jun-15 10:46, US Kidney Bilateral  US Kidney Bilateral  REASON FOR EXAM:    ARF  COMMENTS:       PROCEDURE: [place]  - US KIDNEY  - Jan 10 2014 10:46AM     CLINICAL DATA:  Acute renal failure.    EXAM:  RENAL/URINARY TRACT ULTRASOUND COMPLETE    COMPARISON:  04/21/2013    FINDINGS:  Right Kidney:  Length: 10.7cm.. Echogenicity within normal limits. No mass  or  hydronephrosis visualized.    Left Kidney:    Length: 10.9 cm.. No hydronephrosis. Hypoechoic lesion in the upper  pole measures 1.4 x 1.4 x 1.0 cm. Minimal complexity suspected.    Bladder:    Appears normal for degree of bladder distention.     IMPRESSION:  1.  No acute process or explanation for acute renal failure.  2. Lower pole left renal lesion. Favored to represent a cyst or  minimally complex cyst. Consider ultrasound followup at 6-12 months  to confirm size stability.      Electronically Signed    By: Abigail Miyamoto M.D.    On: 01/10/2014 11:04         Verified By: Areta Haber, M.D.,  [place]:    07-Oct-14 08:45, Chest Portable Single View  PACS Image    07-Oct-14 10:13, CT Abdomen and Pelvis Without Contrast  PACS Image    08-Oct-14 13:14, Abdomen Flat and Erect  PACS Image    09-Oct-14 08:12, Abdomen Flat and Erect  PACS Image    10-Oct-14 11:13, Abdomen Flat and Erect  PACS Image    12-Oct-14 09:48, Ankle Right AP and Lateral  PACS Image    12-Oct-14 10:12, [place] Color Flow Doppler Low Extrem Bilat (Legs)  PACS Image    14-Oct-14 15:35, Chest Portable Single View  PACS Image    14-Oct-14 19:04, Abdomen Flat and Erect  PACS Image    19-Oct-14 10:38, Chest PA and Lateral  PACS Image    20-Oct-14 11:47, Abdomen 3 Way Includes PA Chest  PACS Image    21-Oct-14 20:13, Chest Portable Single View  PACS Image    [phone] 20:10, Chest Portable Single View  PACS Image    05-Dec-14 16:11, Abdomen 3 Way Includes PA Chest  PACS Image    05-Dec-14 18:09, CT Head Without Contrast  PACS Image    06-Dec-14 06:26, Chest Portable Single View  PACS Image    08-Dec-14 11:13, Foot Left AP and Lateral  PACS Image    08-Dec-14 11:13, Foot Right AP and Lateral  PACS Image    30-Jan-15 18:27, Chest PA and Lateral  PACS Image    30-Jan-15 21:04, Foot Right Complete  PACS Image    31-Jan-15 17:31, Bilateral Knees AP/Lat  PACS Image    06-Feb-15 12:33, Chest  Portable Single View  PACS Image    06-Feb-15 13:27, CT Head Without Contrast  PACS Image    06-Feb-15 15:18, [place] Color Flow Doppler Low Extrem Left (Leg)  PACS Image    06-Feb-15 18:11, Chest Portable Single View  PACS Image    05-Mar-15 09:11, Chest Portable Single View  PACS Image    10-Mar-15 08:53, Chest Portable Single View  PACS Image    28-Mar-15 13:33, CT Head Without Contrast  PACS Image    28-Mar-15 13:40, Chest Portable Single View  PACS Image    28-Mar-15 13:40, Knee Left Complete  PACS Image    30-Mar-15 20:16, CT Knee  Left Without Contrast  PACS Image    16-Apr-15 13:50, Wound Aerobic Culture  Ind. Clindamycin Resistance    24-Jun-15 12:23, Heel - Calcaneous Right  PACS Image    24-Jun-15 12:23, Heel (Calcaneous) Left  PACS Image    26-Jun-15 17:38, Chest Portable Single View  PACS Image    28-Jun-15 10:46, US Kidney Bilateral  PACS Image  CT:    07-Oct-14 10:13, CT Abdomen and Pelvis Without Contrast  CT Abdomen and Pelvis Without Contrast  REASON FOR EXAM:    (1) distention; concern for free air on plain CXR    film; abdominal pain; (2) low  COMMENTS:       PROCEDURE: CT  - CT ABDOMEN AND PELVIS W0  - Apr 21 2013 10:13AM     RESULT: Indication: Flank Pain    Comparison: None    Technique: Multiple axial images from the lung bases to the symphysis   pubis were obtained without oral and without intravenous contrast.    Findings:  The lung bases are clear. There is no pleural or pericardial effusions.    No renal, ureteral, or bladdercalculi. No obstructive uropathy. No   perinephric stranding is seen. The kidneys are symmetric in size without   evidence for exophytic mass. The bladder is unremarkable.    The liver demonstrates no focal abnormality. The gallbladder is   surgically absent. The spleen demonstrates no focal abnormality. The   adrenal glands and pancreas are normal.     There is small bowel dilatation measuring up to 4 cm with  numerous   air-fluid levels. The distal ileum and colon are decompressed. There is   gastric distention with an air-fluid level. There is no pneumoperitoneum,   pneumatosis, or portal venous gas. There is no abdominal or pelvic free   fluid. There is no lymphadenopathy.   The abdominal aorta is normal in caliber with atherosclerosis.    There is lumbar spine spondylosis.    IMPRESSION:     1. Small bowel dilatation with multiple small bowel air-fluid levels and   a decompressed distal ileum most consistent with a small bowel   obstruction with no definite transition zone identified.    Dictation Site: 1        Verified By: Jennette Banker, M.D., MD    05-Dec-14 18:09, CT Head Without Contrast  CT Head Without Contrast  REASON FOR EXAM:    altered mental status  COMMENTS:       PROCEDURE: CT  - CT HEAD WITHOUT CONTRAST  - Jun 19 2013  6:09PM     CLINICAL DATA:  Altered mental status, lethargy, fever, tachycardia,  hypertension, worsening symptoms for 2-3 days, decreased level of  consciousness    EXAM:  CT HEAD WITHOUT CONTRAST    TECHNIQUE:  Contiguous axial images were obtained from the base of the skull  through the vertex without intravenous contrast.  COMPARISON:  None    FINDINGS:  Generalized atrophy.    Normal ventricular morphology.    No midline shift or mass effect.    Small vessel chronic ischemic changes of deep cerebral white matter.    Beam hardening artifacts from metal fragments at the posterior right  parietal region.    No intracranial hemorrhage, mass lesion, or evidence acute  infarction.    Question tiny old lacunar infarct at left basal ganglia.    No extra-axial fluid collections.    Atherosclerotic calcifications at skullbase.    Calvaria intact.  IMPRESSION:  Atrophy with small vessel chronic ischemic changes of deep cerebral  white matter.    Probable tiny old lacunar infarct at left basal ganglia.  No acute intracranial  abnormalities.      Electronically Signed    By: Lavonia Dana M.D.    On: 06/19/2013 18:18         Verified By: Burnetta Sabin, M.D.,    06-Feb-15 13:27, CT Head Without Contrast  CT Head Without Contrast  REASON FOR EXAM:    unresponsive  COMMENTS:   May transport without cardiac monitor    PROCEDURE: CT  - CT HEAD WITHOUT CONTRAST  - Aug 21 2013  1:27PM     CLINICAL DATA:  Patient unresponsive    EXAM:  CT HEAD WITHOUT CONTRAST    TECHNIQUE:  Contiguous axial images were obtained from the base of the skull  through the vertex without intravenous contrast.    COMPARISON:  CT HEAD W/O CM dated 06/19/2013  FINDINGS:  There is no evidence of acute hemorrhage nor extra-axial fluid  collections. Global atrophy is appreciated. Diffuse areas of low  attenuation project within the subcortical, deep, and  periventricular white matter regions. Punctate focal areas of low  attenuation project along the medial periphery of the right and left  cerebellar hemispheres, consistent with areas of chronic lacunar  infarction. These areas are well defined. A focal area of low  attenuation projects within the left basal ganglia region,  consistent with a region of with go nor infarction. There is no  evidence of mass effect. The ventricles and cisterns are patent. The  osseous structures demonstrate no evidence of a depressed skull  fracture. Visualized paranasal sinuses and mastoid air cells are  patent.   IMPRESSION:  Chronic and involutional changes without evidence of acute  abnormalities.      Electronically Signed    By: Margaree Mackintosh M.D.    On: 08/21/2013 13:31         Verified By: Mikki Santee, M.D., MD    28-Mar-15 13:33, CT Head Without Contrast  CT Head Without Contrast  REASON FOR EXAM:    weak altered mental status, droopy l face  COMMENTS:       PROCEDURE: CT  - CT HEAD WITHOUT CONTRAST  - Oct 10 2013  1:33PM     CLINICAL DATA:  Unresponsive.  Facial  drooping.    EXAM:  CT HEAD WITHOUT CONTRAST    TECHNIQUE:  Contiguous axial images were obtained from the base of the skull  through the vertex without intravenous contrast.    COMPARISON:  08/23/2013  FINDINGS:  Low density throughout the deep white matter is stable compatible  with chronic microvascular ischemic changes. Mild cerebral atrophy  of. No hemorrhage, hydrocephalus, acute infarction, mass lesion or  midline shift.    Visualized paranasal sinuses and mastoids clear. Orbital soft  tissues unremarkable.     IMPRESSION:  No acute intracranial abnormality.    Atrophy, chronic microvascular disease.    Electronically Signed    By: Rolm Baptise M.D.    On: 10/10/2013 13:40         Verified By: Raelyn Number, M.D.,    30-Mar-15 20:16, CT Knee Left Without Contrast  CT Knee Left Without Contrast  REASON FOR EXAM:    possible quad rupture  COMMENTS:       PROCEDURE: CT  - CT KNEE LEFT WO  - Oct 12 2013  8:16PM  CLINICAL DATA:  Pain and swelling.    EXAM:  CT OF THE LEFT KNEE WITHOUT CONTRAST    TECHNIQUE:  Multidetector CT imaging was performed according to the standard  protocol. Multiplanar CT image reconstructions were also generated.    COMPARISON:  Radiographs dated 10/10/2013  FINDINGS:  The quadriceps tendon is well visualized and is intact. There is a  prominent effusion distending the suprapatellar recess. There is no  acute fracture or dislocation. The patient is fairly severe  tricompartmental osteoarthritis. There is a small Baker's cyst.  There are calcified loose bodies in the joint.     IMPRESSION:  1. The quadriceps tendon appears intact.  2. 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  3. Prominent joint effusion particularly in the suprapatellar  recess.    Electronically Signed    By: Rozetta Nunnery M.D.    On: 10/13/2013 11:16         Verified By: Larey Seat, M.D.,    ASSESSMENT AND PLAN:  Assessment/Admission Diagnosis BLE ulcers and PAD ARI on top of CKD Multiple other issues.  Bedbound.  Previous polysubstance abuse   Plan Known PAD.  Last non-invasives in the office showed mildly reduced ABIs a little lower on the left.  May ultimately benefit from LLE angiogram, but given recent renal issues, would not do this on this admission.  Can follow up with Dr. Delana Meyer in office in 1-2 weeks and can discuss angiogram at that time.  Keep in heel protectors and avoid pressure.  No other vascular recs while in hospital.   level 4   Electronic Signatures: Algernon Huxley (MD)  (Signed 29-Jun-15 10:11)  Authored: Chief Complaint and History, PAST MEDICAL/SURGICAL HISTORY, ALLERGIES, HOME MEDICATIONS, Family and Social History, Review of Systems, Physical Exam, LABS, RADIOLOGY, Assessment and Plan   Last Updated: 29-Jun-15 10:11 by Algernon Huxley (MD)

## 2014-11-06 NOTE — Consult Note (Signed)
Admit Diagnosis:   FEVER: Onset Date: 15-Aug-2013, Status: Active, Description: FEVER  Home Medications: Medication Instructions Status  Metoprolol Tartrate 50 mg oral tablet 1 tab(s) orally 2 times a day Active  magnesium oxide 400 mg (241.3 mg elemental magnesium) oral tablet 1 tab(s) orally once a day Active  cloNIDine 0.1 mg oral tablet 1 tab(s) orally 2 times a day Active  Vitamin D3 2000 intl units oral tablet 1 tab(s) orally once a day Active  pantoprazole 40 mg oral delayed release tablet 1 tab(s) orally once a day Active  tamsulosin 0.4 mg oral capsule 1 cap(s) orally once a day Active  Avodart 0.5 mg oral capsule 1 cap(s) orally once a day Active  Probiotic Formula - oral capsule 1 cap(s) orally once a day Active  multivitamin 1 tab(s) orally once a day Active  MiraLax - oral powder for reconstitution 1 capful (17 grams) in 8oz of water and drink orally once a day Active  docusate sodium sodium 100 mg oral capsule 1 cap(s) orally 2 times a day Active  Levemir 100 units/mL subcutaneous solution 28 unit(s) subcutaneous once a day (in the morning) and 25 units subcutaneous once a day (at bedtime) Active  gabapentin 300 mg oral capsule 1 cap(s) orally 2 times a day Active  metoclopramide 10 mg oral tablet 1 tab(s) orally 3 times a day (with meals) Active  meloxicam 7.5 mg oral tablet 1 tab(s) orally 2 times a day Active  oxyCODONE 5 mg oral tablet 1 tab(s) orally every 4 hours, As Needed - for Pain Active  Micro-Guard 2% topical powder Apply topically to groin 2 times a day Active  NovoLOG 100 units/mL subcutaneous solution sliding scale subcutaneous as needed Active   Lab Results: Hepatic:  31-Jan-15 02:14   Bilirubin, Total 1.0  Alkaline Phosphatase 93 (45-117 NOTE: New Reference Range 06/05/13)  SGPT (ALT) 39  SGOT (AST)  69  Total Protein, Serum  5.3  Albumin, Serum  1.8  Routine Micro:  30-Jan-15 17:48   Micro Text Report BLOOD CULTURE   COMMENT                    NO GROWTH IN 8-12 HOURS   ANTIBIOTIC                       Culture Comment NO GROWTH IN 8-12 HOURS  Result(s) reported on 15 Aug 2013 at 02:00AM.  Routine Chem:  31-Jan-15 02:14   Result Comment TROPONIN - RESULTS VERIFIED BY REPEAT TESTING.  - ELEVATED TROPONIN PREVIOUSLY CALLED AT  - 2126 08/14/13.PMH  Result(s) reported on 15 Aug 2013 at 03:28AM.  Result Comment PLATELETS - FIBRIN STRANDS SEEN ON SMEAR. THIS MAY  - AFFECT THE PLATELET COUNT. THE ACTUAL  - NUMERICAL COUNT MAY BE SOMEWHAT HIGHER  - THAN THE REPORTED VALUE.  Result(s) reported on 15 Aug 2013 at 02:47AM.  Glucose, Serum  163  BUN  22  Creatinine (comp)  1.67  Sodium, Serum 140  Potassium, Serum  3.4  Chloride, Serum 107  CO2, Serum 30  Calcium (Total), Serum  8.3  Osmolality (calc) 286  eGFR (African American)  45  eGFR (Non-African American)  39 (eGFR values <69mL/min/1.Alan m2 may be an indication of chronic kidney disease (CKD). Calculated eGFR is useful in patients with stable renal function. The eGFR calculation will not be reliable in acutely ill patients when serum creatinine is changing rapidly. It is not useful in  patients on dialysis.  The eGFR calculation may not be applicable to patients at the low and high extremes of body sizes, pregnant women, and vegetarians.)  Anion Gap  3  Cardiac:  31-Jan-15 02:14   CK, Total  747 (Result(s) reported on 15 Aug 2013 at 01:10PM.)  Troponin I  0.07 (0.00-0.05 0.05 ng/mL or less: NEGATIVE  Repeat testing in 3-6 hrs  if clinically indicated. >0.05 ng/mL: POTENTIAL  MYOCARDIAL INJURY. Repeat  testing in 3-6 hrs if  clinically indicated. NOTE: An increase or decrease  of 30% or more on serial  testing suggests a  clinically important change)  Routine Hem:  31-Jan-15 02:14   WBC (CBC)  11.6  RBC (CBC)  3.19  Hemoglobin (CBC)  8.4  Hematocrit (CBC)  25.9  Platelet Count (CBC) 329  MCV 81  MCH 26.4  MCHC 32.5  RDW  28.9  Neutrophil % 59.8   Lymphocyte % 26.0  Monocyte % 12.6  Eosinophil % 0.6  Basophil % 1.0  Neutrophil #  7.0  Lymphocyte # 3.0  Monocyte #  1.5  Eosinophil # 0.1  Basophil # 0.1    No Known Allergies:   No Known Allergies:   Nursing Flowsheets: *Intake and Output.:   31-Jan-15 11:15  Grand Totals Intake:  120 Output:      Net:  120 24 Hr.:  -180  Oral Intake      In:  120  Percentage of Meal Eaten  100    13:59  Grand Totals Intake:   Output:  250    Net:  -250 24 Hr.:  -430  Urine ml     Out:  250  Urinary Method  Void; Urinal  Stool  small formed bm in bed    Shift 15:00  Grand Totals Intake:  120 Output:  550    Net:  -430 24 Hr.:  -430  Oral Intake      In:  120  Urine ml     Out:  550  Length of Stay Totals Intake:  472 Output:  550    Net:  -78    16:24  Grand Totals Intake:   Output:  125    Net:  -59 24 Hr.:  -555  Urine ml     Out:  125  Urinary Method  Void; Urinal    General Aspect 80 y/o AA Mcintyre in no acute distress.   Present Illness 80 y/o AA Mcintyre admitted to hospital for fever. He has right heel ulcer for which he is seen by podiatry. Patient complaints about bilateral knee pain and swelling for past 3-4 days which is getting worse. Has baseline knee pain with past episodes of knee swelling. Orthopaedics consulted for arthrocentesis to r/u septic joitn as cause of fever.   Case History and Physical Exam:  Past Medical Health Diabetes Mellitus, TIA, emphysema, CKD, arthritis, heel ulcer   Past Surgical History colon surgery, back surgery   Family History Non-Contributory   HEENT EOM intact, head atraumatic   Neck/Nodes Supple   Chest/Lungs no use of accessory muscles, non-labored breathing   Breasts Not examined   Cardiovascular Normal Sinus Rhythm  good distal perfusion   Abdomen Benign   Genitalia Not examined   Rectal Not examined   Musculoskeletal Right heel ulces debrided and dressed by podiatry. Bilateral LE exam: decreased distal  perfusion with weak pulses and cold toes. SILT DP/SP. EHL/FHL/GCS/TA working bilaterally. Knee ROM bilateral knee painful past 90 degrees. Lacks about 5-210 degrees of extension  due to arthritis. Bilateral knee effusions, right worse than left.   Neurological Grossly WNL   Skin Warm  Dry    Impression 80 y/o AA Mcintyre with bilateral knee effusions and knee pain.   Plan Bilateral knee aspirations performed. Xrays pending. No inflammatory markers (ESR, CRP) available.  Indications: Primary service concerned if septic arthritis as cause for fever. New onset effusion has raised a concern regarding possible infection in knees.  Procedure left knee:  The patient was placed in supine position and written verbal consent to proceed on with aspiration.  Skin was prepped with chloraprep.  The knee was aspirated of 8cc synovial fluid.  Patient tolerated the procedure well. The fluid is sent for gram stain, aerobic and non-aerobic culture and sensitivity, cell count and crystals.   Procedure right knee:  The patient was placed in sitting and supine position and written verbal consent to proceed on with aspiration.  Skin was prepped with chloraprep.  The knee was aspirated of 4 & 10cc synovial fluid.  Patient tolerated the procedure well. The fluid is sent for gram stain, aerobic and non-aerobic culture and sensitivity, cell count and crystals.  - WBAT - ROM as tolerated - non-cloudy synovial fluid aspirated, awaiting results, will follow   Electronic Signatures: Harle Battiest (MD)  (Signed 31-Jan-15 16:52)  Authored: Health Issues, Home Medications, Labs, Allergies, Vital Signs, General Aspect/Present Illness, History and Physical Exam, Impression/Plan   Last Updated: 31-Jan-15 16:52 by Harle Battiest (MD)

## 2014-11-06 NOTE — Consult Note (Signed)
Brief Consult Note: Diagnosis: left knee effusion, possible quad rupture.   Patient was seen by consultant.   Recommend further assessment or treatment.   Orders entered.   Comments: needs MRI to evaluate quadriceps tendon. may need repair to make him ambulatory.  Electronic Signatures: Laurene Footman (MD)  (Signed 29-Mar-15 09:29)  Authored: Brief Consult Note   Last Updated: 29-Mar-15 09:29 by Laurene Footman (MD)

## 2014-11-06 NOTE — H&P (Signed)
PATIENT NAME:  Alan Mcintyre, Alan Mcintyre MR#:  [id number] DATE OF BIRTH:  24-Aug-1934  DATE OF ADMISSION:  08/21/2013  PRIMARY CARE PHYSICIAN: Meindert A. Brunetta Genera, MD  The patient is a 80 year old African American male who was admitted recently and discharged on the 2nd of February 2015 for SIRS, which was felt to be possibly related to arthritis, questionable right heel ulcer. Presented back to the hospital unresponsive. Apparently the patient was quite okay in his group home earlier today, however, became unresponsive and was brought to the Emergency Room for further evaluation. In the Emergency Room, he was not responding even to painful stimuli, and he had minimal discomfort when the right EJ triple-lumen catheter was placed. Hospital services were contacted for admission. The patient's labs were unremarkable. The patient's chest x-ray as well as EKG and CT of the head were also unremarkable for any events.   Now he seems to be a little bit more alert. He is trying to open his eyes, and he is able to mumble some in response to questions. However, I am not able to get much history from him though.  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 malignant hypertension,  insulin-dependent diabetes mellitus, history of stroke, history of BPH, bilateral heel pressure ulcerations, chronic constipation, history of emphysema, as well as TIA.   PAST SURGICAL HISTORY: Back surgery, cholecystectomy, colonoscopies, exploratory laparotomy with enterotomy and adhesiolysis due to complete small bowel obstruction in October 2014 by Dr. Leanora Cover.   SOCIAL HISTORY: The patient lives in assisted living facility in [place] in  home care facility. He has 2 sons and 2 daughters. Used to work as a Retail buyer.   FAMILY HISTORY: The patient does not remember much, and he is not able to provide history.   REVIEW OF SYSTEMS: Also not available at this point.   PHYSICAL EXAMINATION:   VITAL SIGNS: On arrival to the hospital, the patient's temperature was not measured. Pulse was 67, respiration rate 10, blood pressure 167/51. Saturation was 99% on oxygen therapy.  GENERAL: This is a well-developed, well-nourished [place] American male who is lying on the stretcher.  HEENT: His pupils are equal, reactive to light. Extraocular movements are intact. No icterus or conjunctivitis. He has normal hearing. He is able to halfway open his eyes and mumble to the questions. No pharyngeal erythema. Mucosa is moist.  NECK: No masses. Supple, nontender. Thyroid is not enlarged. No adenopathy. No JVD or carotid bruits bilaterally. Full range of motion.  LUNGS: Clear to auscultation but diminished breath sounds. No rales, rhonchi or wheezing. No labored inspirations, increased effort, dullness to percussion, overt respiratory distress.  CARDIOVASCULAR: S1, S2 appreciated. No murmurs, gallops or rubs noted. PMI not lateralized. Chest is nontender to palpation. Pedal pulses 1+. The patient does have left lower extremity edema, but no calf tenderness or cyanosis was noted.  ABDOMEN: Soft, nontender. Bowel sounds are present. No hepatosplenomegaly or masses were noted.  RECTAL: Deferred.  MUSCULOSKELETAL: Muscle strength: Not able to comply with requests to move extremities. No cyanosis. Not able to assess for kyphosis. Gait is not tested.  SKIN: Did not reveal any rashes, lesions, erythema, nodularity or induration. It was warm and dry to palpation. The patient does have chronic ulcerations in his extremities in his  heels; however, they are dressed. Right heel wound dressing is caked to the wound itself, and I am not able to rip it off at this time. The  patient otherwise does not have any drainage in anterior portion of his ankle. However, he does have what looks like old healing wound. No other lesions, erythema, nodularity were noted. No skin induration. LYMPHATIC: No adenopathy in the cervical region.  NEUROLOGICAL: Cranial nerves grossly intact. Sensory is difficult to obtain, as the patient is poorly responsive. He does have some dysarthria when he tries to communicate, however, minimal communication, just a few words he was able to tell me. He is poorly cooperative. I am not able to assess memory.  LABORATORY DATA: BMP on 6th of February 2015 revealed elevation of glucose to 125. BUN and creatinine were 19 and 1.49. Estimated GFR for African American would be 52. It was 55 on the 1st of February 2015. Liver enzymes: Albumin level of 2.2, elevation of AST to 62. However, the patient's CK total was normal at 18. The patient's white blood cell count is normal at 7.9; hemoglobin was 9.1 and platelet count was 347. Urinalysis: Amber cloudy urine; negative for glucose, bilirubin or ketones; specific gravity 1.015; pH was 6.0, 1+ blood, 100 mg/dL protein, negative for nitrites, 1+ leukocytes esterase, 0 to 5 red blood cells, 5 to 15 white blood cells, 2+ bacteria, 0 to 5 epithelial cells as well as budding yeast. The patient ABGs: pH 7.42, pCO2 of 50, pO2 of 109, saturation 98.3 with bicarbonate level of 32.4. Lactic acid level was normal at 1.1, and this was on 28% FiO2.   EKG showed normal sinus rhythm at 66 beats per minute, normal axis. No acute ST-T changes were noted.   RADIOLOGIC STUDIES: Chest x-ray, portable single view, 6th of February 2015, revealed no active disease. CT scan of the head without contrast, 6th of February 2015, showed chronic involutional changes without evidence of acute abnormalities.   ASSESSMENT AND PLAN: 1.  Unresponsiveness, coma, of unclear etiology at this time, possibly drug related. The patient is on opiates at home in  group home. However, he had no response to Narcan. The patient's ABGs are unremarkable. However, the patient's urine drug screen is still positive. We will get ammonia level. The patient admits of alcohol abuse in the past.  2.  Hypertension. We will add clonidine patch until he is evaluated by speech therapist to take p.o. medications.  3.  Renal insufficiency, chronic, stable.  4.  Left lower extremity edema. Get Doppler ultrasound to rule out deep vein thrombosis.  5.  Diabetes mellitus. We will continue Levemir as well as sliding scale insulin.  6.  Pyuria, questionable urinary tract infection. Will get cultures and will start Levaquin IV.   TIME SPENT: 50 minutes.    ____________________________ Theodoro Grist, MD rv:jcm D: 08/21/2013 14:49:01 ET T: 08/21/2013 17:45:06 ET JOB#: [id number]  cc: Theodoro Grist, MD, <Dictator> Meindert A. Brunetta Genera, MD Theodoro Grist MD ELECTRONICALLY SIGNED 09/15/2013 [id number]

## 2014-11-06 NOTE — Op Note (Signed)
PATIENT NAME:  Alan Mcintyre, Alan Mcintyre MR#:  [id number] DATE OF BIRTH:  [birth year]  DATE OF PROCEDURE:  11/03/2013  PREOPERATIVE DIAGNOSIS: Atherosclerotic occlusive disease, bilateral lower extremities, with ulcerations of the right foot.   POSTOPERATIVE DIAGNOSIS: Atherosclerotic occlusive disease, bilateral lower extremities, with ulcerations of the right foot.   PROCEDURES PERFORMED: 1.  Abdominal aortogram.  2.  Right lower extremity distal runoff, third order catheter placement.  3.  Additional third order catheter placement.  4.  Percutaneous transluminal angioplasty with 6 mm maximum Lutonix balloon, right SFA.   PROCEDURE PERFORMED BY: Katha Cabal, MD   SEDATION: Versed plus fentanyl. Continuous ECG, pulse oximetry, and cardiopulmonary monitoring was performed throughout the entire procedure by the interventional radiology nurse. Total sedation time was 2 hours.   ACCESS: A 6-French sheath, left common femoral artery.   FLUOROSCOPY TIME: 32.3 minutes.   CONTRAST USED: Isovue 105 mL.   INDICATIONS: Mr. Veldkamp is a 80 year old gentleman who presented to the office with increasing ulcerations and gangrenous changes of his right foot. Risks and benefits for limb salvage were reviewed. All questions have been answered. The patient has requested we proceed to avoid limb loss.   DESCRIPTION OF PROCEDURE: The patient is taken to special procedures and placed in the supine position. After adequate sedation is achieved, both groins are prepped and draped in sterile fashion. Lidocaine 1% is infiltrated in the soft tissues. Overlying the left femoral impulse, an access is obtained after ultrasound is utilized. Ultrasound demonstrates the common femoral artery, which is echolucent and pulsatile, indicating patency. Image is recorded for the permanent record, and a micropuncture needle is used to access the artery under direct visualization. Microwire followed by micro sheath, J-wire followed by  5-French sheath and 5-French pigtail catheter. Pigtail catheter is advanced to the level of T12, and AP projection of the aorta is obtained. Pigtail catheter is repositioned to above the bifurcation, and an LAO projection of the aorta is obtained. RIM catheter is then used to cross the aortic bifurcation. The catheter and wire are advanced down into the SFA on the right. The catheter is then exchanged for a pigtail catheter, and an RAO projection of the groin is obtained and subsequently, AP imaging of the distal runoff is obtained. Multiple high-grade lesions are noted within the SFA, greater than 80%, one in the proximal one third and multiple in the Hunter's canal, proximal popliteal area. Distally, there is patency of the anterior tibial and peroneal. Posterior tibial is occluded throughout its course. However, there is diffuse disease with subtotal occlusions above the anterior tibial and the peroneal.   The wire is reintroduced, and the catheter is removed. Sheath is exchanged for a 6-French Ansel and a stiff angled Glidewire and straight catheter advanced down through the SFA and popliteal lesions, and attempts are made at crossing the anterior tibial. Hand injection of contrast with the catheter in the proximal anterior tibial is performed with magnified imaging. However, ultimately, there is such a steep oblique angle that the ability to get the wire into the true anterior tibial is not achieved. Multiple injections in the anterior tibial were made ultimately, but despite multiple different wires and multiple different catheters, there was no success and attempts were then made to engage the peroneal. Again, wires and catheters were advanced down to the peroneal. This represents the additional tibial vessel placement. Ultimately, in an attempt to gain more maneuverability, a 4 x 2 balloon is advanced across the distal tibioperoneal trunk and proximal peroneal  and inflated for 1 minute to 12 atmospheres.  Unfortunately, although this did result in improvement at the ostial lesion, there was still no success in obtaining wire placement within the peroneal. Ultimately, this was then abandoned and attempts at improving the SFA inflow were then focused. Two 5 mm diameter Lutonix balloons, a 5 x 100 and a 5 x 40, were then utilized to angioplasty the SFA and popliteal across Hunter's canal. A 6 x 40 was then used to angioplasty the proximal lesion. All Lutonix inflations were for 3 minutes and to 12 atmospheres. Followup imaging from the level of the groin down to the ankle was then performed through the sheath, demonstrating wide patency of the SFA and popliteal now. There is preservation of the anterior tibial and peroneal runoffs.   The sheath was then pulled into the left external iliac, oblique view obtained, and a StarClose device deployed without difficulty. There are no immediate groin-related complications.   INTERPRETATION: The abdominal aorta is tortuous and opacified with a bolus injection of contrast. There is diffuse disease, but no hemodynamically significant stenoses noted. Iliacs are widely patent and tortuous, but no hemodynamically significant stenoses are identified.   The right common femoral and profunda femoris are patent. The SFA demonstrates a greater than 80% narrowing in its proximal one third and then multiple greater than 80% narrowings in the distal SFA, proximal popliteal, across Hunter's canal. Trifurcation demonstrates diffuse disease. There is occlusion of the anterior tibial in its proximal one third with diffuse disease at the ostia. There is diffuse disease of the ostia of the peroneal, as well as occlusion just distal to Posterior tibial is occluded throughout its course. Following multiple attempts, wire control of the tibials was not successful. Angioplasty of the ostia of the peroneal was not performed. Angioplasty of the SFA and popliteal was successful, as described above.    SUMMARY: Partial recanalization of the right lower extremity for attempted limb salvage. Again, after 4 to 6 weeks, reconsideration for followup intervention will be performed such that gaining patency of the tibials directly to the foot so that in-line flow can be re-established would be much more optimal and much more likely to allow for limb salvage.    ____________________________ Katha Cabal, MD ggs:jcm D: 11/03/2013 18:31:37 ET T: 11/03/2013 20:19:52 ET JOB#: [id number]  cc: Katha Cabal, MD, <Dictator> Katha Cabal MD ELECTRONICALLY SIGNED 11/17/2013 10:03

## 2014-11-06 NOTE — Consult Note (Signed)
PATIENT NAME:  Alan Mcintyre, Alan Mcintyre MR#:  [id number] DATE OF BIRTH:  1934-07-17  DATE OF CONSULTATION:  10/11/2013  CONSULTING PHYSICIAN: Laurene Footman, M.D.   REASON FOR CONSULTATION: Left knee swelling.   HISTORY OF PRESENT ILLNESS: The patient is a 80 year old male who has had trouble walking for several weeks. He has significant health problems including chronic kidney disease, dementia and diabetes. He had a very large effusion to his knee, which was aspirated and was bloody. On examination, he is found to have palpable defect proximal to his patella consistent with quadriceps rupture. He is unable to actively extend the knee.   CLINICAL IMPRESSION: Possible quadriceps rupture.   RECOMMENDATION: MRI of the knee, and if there is quad rupture he would either need operative intervention or need a locked knee brace to allow him to ambulate on his leg.    ____________________________ Laurene Footman, MD mjm:sg D: 10/11/2013 09:41:58 ET T: 10/11/2013 13:11:49 ET JOB#: [id number]  cc: Laurene Footman, MD, <Dictator> Laurene Footman MD ELECTRONICALLY SIGNED 10/11/2013 15:39

## 2014-11-06 NOTE — Op Note (Signed)
PATIENT NAME:  Alan Mcintyre, Alan Mcintyre MR#:  [id number] DATE OF BIRTH:  October 07, 1934  DATE OF PROCEDURE:  08/21/2013  PREOPERATIVE DIAGNOSES:  1.  Change in mental status.  2.  Confusion.  3.  Bacteremia with hypotension secondary to urinary sores.   POSTOPERATIVE DIAGNOSES:  1.  Change in mental status.  2.  Confusion.  3.  Bacteremia with hypotension secondary to urinary sores.   PROCEDURE PERFORMED: Insertion, left internal jugular triple-lumen catheter.   SURGEON: Hortencia Pilar, MD  DESCRIPTION OF PROCEDURE: The patient is a 80 year old gentleman who has been transferred to the intensive care unit without adequate IV access. He is hypotensive and requiring multiple parenteral medications to sustain his life. I am asked to place a triple-lumen catheter. The risks and benefits were not able to discussed with the patient given his condition, and it was deemed a medical emergency given his critical condition, to which the [facility] doctor has signed off.   DESCRIPTION OF PROCEDURE: The patient is positioned supine and left neck is prepped and draped in sterile fashion. The ultrasound is placed in a sterile sleeve. The jugular vein is identified. It is echolucent and compressible indicating patency. Image is recorded for the permanent record.   Under real-time visualization, after 1% lidocaine is infiltrated in the soft tissues, Seldinger needle is inserted. J-wire is advanced. Counterincision is made. Triple-lumen catheter is advanced over the wire. All 3 lumens aspirate and flush easily. It is secured to the skin of the neck with 2-0 silk. Sterile dressing with a Biopatch is applied. The patient tolerated the procedure well and there were no immediate complications.   ____________________________ Katha Cabal, MD ggs:np D: 08/25/2013 14:45:19 ET T: 08/25/2013 16:12:09 ET JOB#: [id number]  cc: Katha Cabal, MD, <Dictator> Katha Cabal MD ELECTRONICALLY SIGNED 08/27/2013 17:16

## 2014-11-06 NOTE — Discharge Summary (Signed)
PATIENT NAME:  Alan Mcintyre, Alan Mcintyre MR#:  [id number] DATE OF BIRTH:  [birth year]  DATE OF ADMISSION:  11/03/2013 DATE OF DISCHARGE:  11/06/2013   ADMITTING DIAGNOSIS: Atherosclerotic occlusive disease, bilateral lower extremities, with ulceration of the right foot.  DISCHARGE DIAGNOSIS: Atherosclerotic occlusive disease, bilateral lower extremities, with ulceration of the right foot.  PROCEDURE PERFORMED: On 11/03/2013, Dr. Delana Meyer performed a right lower extremity angiogram with angioplasty to the right superficial femoral artery.   HISTORY OF PRESENT ILLNESS: The patient is a 80 year old man who presented to the office with increasing ulceration and gangrenous changes of his right foot. Risks and benefits for limb salvage were reviewed, and he wished to proceed with angiogram to avoid limb loss.   HOSPITAL COURSE: On 11/03/2013, the above angiogram procedure was performed. He was admitted for further care following the procedure. He was placed on a heparin drip during his hospital stay. He was closely monitored and given pain control for the following few days while he was in the hospital. Had 2+ right popliteal pulse post procedure. He improved from prior to procedure. On 11/06/2013, he continued to do well and is ready for discharge.   DISPOSITION: He is being discharged to [facility].   WOUND CARE: He will continue to follow at the [place].  DIET: Low fat, low cholesterol, diabetic.   FOLLOWUP: In 1 to 2 weeks with [facility]. He will likely need to be scheduled for left leg angiogram at that time. He would also likely benefit from a repeat angiogram to the right lower extremity to try to improve further distal perfusion, which was discussed with Dr. Delana Meyer.   ____________________________ Marin Shutter Willodene Stallings, PA-C cnh:lb D: 11/06/2013 12:34:01 ET T: 11/06/2013 13:10:01 ET JOB#: [id number]  cc: Marin Shutter. Eashan Schipani, PA-C, <Dictator> Noralee Dutko N Arnice Vanepps  PA ELECTRONICALLY SIGNED 11/08/2013 20:12

## 2014-11-06 NOTE — Discharge Summary (Signed)
PATIENT NAME:  Alan Mcintyre, Alan Mcintyre MR#:  [id number] DATE OF BIRTH:  07-09-1935  DATE OF ADMISSION:  08/21/2013  DATE OF DISCHARGE:  08/22/2013  PRESENTING COMPLAINT: Altered mental status, lethargy.   DISCHARGE DIAGNOSES: 1.  Acute mild encephalopathy, resolved, etiology unclear.  2.  CKD stage III.  3.  Hypertension.  4.  Chronic lower extremity ulcers.  5.  Type 2 diabetes.   CODE STATUS: FULL CODE.   MEDICATIONS: 1.  Magnesium oxide 400 mg p.o. daily.  2.  Vitamin D3, 2000 international units daily.  3.  Protonix 40 mg daily.  4.  Tamsulosin 0.4 mg p.o. daily.  5.  Avodart 0.5 mg daily.  6.  Probiotic 1 capsule p.o. daily.  7.  Multivitamin p.o. daily.  8.  MiraLAX 1 capful in 8 ounces of water b.i.d.  9.  Docusate 100 mg b.i.d.  10.  Levemir 28 units in the morning and 25 units at bedtime.  11.  Gabapentin 300 mg b.i.d.  12.  Metoprolol tartrate 50 mg p.o. b.i.d.  13.  Metoclopramide 10 mg 3 times a day with meals.  14. Micro-Guard 2% topical powder to affected groin twice a day.  15.  NovoLog sliding scale.  16.  Clonidine 0.1 mg b.i.d.  17.  Meloxicam 7.5 mg b.i.d.  18.  Oxycodone 5 mg every 6 hours as needed.   INSTRUCTIONS:  Dressing changes for chronic lower extremity ulcers as before.   Low sodium, carbohydrate-controlled ADA diet.   Follow up with Dr. Brunetta Genera in 1 to 2 weeks.   The patient will be discharged back to [facility].   LABS AT DISCHARGE:  CBC within normal limits, except H and H of 8.4 and 24.4. Cardiac enzymes x 3 negative. Serum ethanol is less than 0.003. Ammonia is less than 10.   Chest x-ray: No acute cardiopulmonary abnormality. Urine culture negative in 8 to 12 hours. Ultrasound lower extremity: No evidence of DVT, left lower extremity edema present. CT of the head shows chronic and involutional change, without acute abnormalities.   pH is 7.42, pCO2 is 50, pO2 of 100. Lactic acid 1.10. White count is 7.9. Creatinine was 1.49,  albumin is 2.2. LFTs within normal limits, except SGOT of 62. UA negative for UTI.   BRIEF SUMMARY OF HOSPITAL COURSE: Alan Mcintyre is a 80 year old African- American gentleman, brought in from group home with altered mentation and lethargy.   1.  The patient was admitted with acute mild encephalopathy with altered mental status, which resolved and the patient is back at baseline. No source of acute infection noted. He has chronic heel ulcers. He is on opiates at home in the group home but, per caregiver, no excess use. No seizures were noted. There is no evidence of acute CVA as well. The patient's lactic acid was normal. ABGs were unremarkable. Urine drug screen not done. Ammonia and alcohol levels were normal. The patient was eating very well, was mentating, and was alert and oriented x 2.   2.  Hypertension. We resumed his home medications, which are clonidine and metoprolol.   3.  Renal insufficiency, chronic, which is stable.   4.  Left lower extremity edema secondary to chronic heel ulcers. The patient did not have evidence of DVT on ultrasound. His heel ulcer dressing will be continued as before.   5.  Type 2 diabetes. Continued on Levemir and sliding scale.   I spoke with the patient's caregiver at [facility], and they were able to  take patient back to the group home. No seizure activity was noted as well. Hospital stay otherwise remained stable. The patient remained a FULL CODE.   TIME SPENT:  40 minutes.   ____________________________ Hart Rochester Posey Pronto, MD sap:mr D: 08/23/2013 07:45:13 ET T: 08/23/2013 18:17:41 ET JOB#: [id number]  cc: Brien Lowe A. Posey Pronto, MD, <Dictator> Meindert A. Brunetta Genera, MD  Ilda Basset MD ELECTRONICALLY SIGNED 08/27/2013 13:22

## 2014-11-06 NOTE — Discharge Summary (Signed)
PATIENT NAME:  Alan Mcintyre, Alan Mcintyre MR#:  [id number] DATE OF BIRTH:  10-03-1934  DATE OF ADMISSION:  10/12/2013 DATE OF DISCHARGE:  10/13/2013  CONSULTANT: Laurene Footman, MD  CHIEF COMPLAINT: Altered mental status and drowsiness.   DISCHARGE DIAGNOSES: 1.  Metabolic encephalopathy, resolved.  2.  Acute on chronic mild renal failure stage 3.  3.  Fever, suspected urinary tract infection.  4.  Hypertension.  5.  Diabetes.  6.  Acute on chronic anemia, likely dilutional.  7.  History of chronic lower extremity ulcerations.  8.  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  9.  Hypertension.  10.  History of encephalopathy in the past.  11.  Dementia.  12.  History of malignant hypertension.  13.  Diabetes.  14.  History of stroke.  15.  Benign prostatic hypertrophy.  16.  Chronic constipation.  17.  History of emphysema.  18.  History of transient ischemic attack.  19.  Bedbound state.   DISCHARGE MEDICATIONS:  1.  Magnesium oxide 400 mg once a day.  2.  Vitamin D3 2000 units once a day.  3.  Pantoprazole 40 mg daily.  4.  Tamsulosin 0.4 mg once a day.  5.  Avodart 0.5 mg once a day.  6.  Probiotics 1 cap once a day.  7.  Multivitamin 1 tab once a day.  8.  MiraLax 17 grams 2 times a day.  9.  Levemir 28 units once a day in the morning and 25 units at bedtime.  10.  Sliding scale insulin.  11.  Gabapentin 300 mg 2 times a day.  12.  Metoprolol tartrate 50 mg 2 times a day.  13.  Metoclopramide 10 mg 3 times a day with meals.  14.  Clonidine 0.2 mg 2 times a day.  15.  Amlodipine 10 mg once a day.  16.  Tylenol 500 mg 1 to 2 tabs orally every 4 hours as needed for pain.  17.  Meloxicam 7.5 mg 2 times a day.  18.  Tramadol 50 mg 1/2 tab every 4 hours as needed for pain.  19.  Glucerna 3 times a day with meals.   He is also getting discharged with home health, PT and R.N.    DIET: Low sodium, low fat, ADA diet.   ACTIVITY: As tolerated.   DISCHARGE INSTRUCTIONS:  Please follow with PCP within 1 to 2 weeks and the left heel paint with Betadine swab stick and allow to dry, cover with gauze with Kerlix wrap and tape, change twice daily. Right heel, right dorsum of the foot and right lateral malleolus cleaned with normal saline. Apply saline moistened gauze, top with dry gauze, secure with Kerlix wrap and tape. Change twice daily. Right ischial tuberosity and sacral wound cleansed with normal saline, pat gentle dry, cover with saline-moistened gauze and pat dry, cover with soft silicone foam dressing twice daily, saline dressing change with soft silicone foam dressing change every other day and as needed. The patient is a full code.   LABORATORY, DIAGNOSTIC AND RADIOLOGICAL DATA:  Initial BUN 22, creatinine 1.67, sodium 143, potassium 3.8. Initial troponin negative. Last sodium of 139 with BUN 22, creatinine 1.43. TSH 3.05. Initial white count of 10.5, ESR of 47, hemoglobin 9.3, platelets 456, last hemoglobin of 7.8. Synovial fluid analysis color is red, 2416 nucleated cells, 95% neutrophils, no crystals. Cultures have been no growth to date from the left knee arthrocentesis.  Blood cultures and urine cultures from March 28th no growth to date. Urinalysis showed 2+ RBCs, 13 WBC, 1+ bacteria. CT head without contrast on admission no acute intracranial abnormality, there is atrophy with chronic microvascular disease. CT of the knee without contrast shows quadricep tendon appears intact, tricompartmental osteoarthritis most severe in the patellofemoral and medial compartments, prominent joint effusion.   HPI AND HOSPITAL COURSE: For full details of H and P, please see the dictation on March 28th by Dr. Darvin Neighbours  but, briefly, this is a 80 year old male with history of CKD, dementia, hypertension, stroke, chronic lower extremity ulcerations, arthritis who came in from assisted living  facility after he was found to be drowsy, confused. He was noted to have significant effusion in the knee and underwent arthrocentesis. He was given antibiotics initially as well. The patient was seen by Dr. Rudene Christians and the arthrocentesis showed about 3000 nucleated cells, 95% neutrophils. He was afebrile with normal white count. He was admitted for dehydration and mild acute renal failure and acute encephalopathy. He was started on some fluids as well. Encephalopathy was deemed to be metabolic in nature. His mental status improved and went back to baseline the following day with hydration. He did spike a fever and he was cultured blood, urine. His arthrocentesis does not suggest a septic joint. He did have a positive UA but he is not a reliable historian and did not complain of any dysuria or frequency. He was started on ceftriaxone, however, at this point although we do suspect a UTI, his cultures have been negative and perhaps that is asymptomatic bacteriuria in the UA as the urine cultures have grown no significant organisms. He has had no further fevers. He has received about 4 days of antibiotics. I have discussed the CAT scan result of the knee with Dr. Rudene Christians, who does not recommend any further workup at this point except for PT, pain control and outpatient followup. His blood cultures have been no growth to date. The arthrocentesis and urine cultures, again, have been no growth to date and the patient does have a history of severe tricompartmental arthritis in the knee. In regards to his chronic lower extremity ulcerations, we did a wound consult, who evaluated the wounds and recommended the above-dictated regimen of wound care. At this point, we do not believe that the chronic wounds are infected. He did drop his hemoglobin with hydration which likely was delusional. He has had no active bleed. At this point, he appears to be back to his baseline. He will be discharged with outpatient followup.   PHYSICAL  EXAMINATION: VITAL SIGNS: On the day of discharge, his temperature is 99.2, pulse rate of 81, respiratory rate 18, blood pressure 165/76, O2 sat of 98% on room air.  GENERAL: The patient is a chronically ill-appearing male lying in bed, no obvious distress.  HEENT: Normocephalic traumatic. Moist mucous membranes.  NECK: Supple.  CARDIOVASCULAR: S1, S2, irregularly irregular. No significant murmurs.  LUNGS: Clear to auscultation.  ABDOMEN: Soft, nontender.  EXTREMITIES: Do not exhibit any significant edema and the lower extremities bilaterally are wrapped with dressing changes as of today.   At this point, he will be discharged with outpatient followup.   TOTAL TIME SPENT: Is 40 minutes. The patient is full code   ____________________________ Vivien Presto, MD sa:cs D: 10/13/2013 14:47:00 ET T: 10/13/2013 15:36:55 ET JOB#: [id number]  cc: Vivien Presto, MD, <Dictator> Vivien Presto MD ELECTRONICALLY SIGNED 10/28/2013 10:14

## 2014-11-06 NOTE — H&P (Signed)
PATIENT NAME:  Alan Mcintyre, Alan Mcintyre MR#:  [id number] DATE OF BIRTH:  Feb 10, 1935  DATE OF ADMISSION:  08/14/2013  PRIMARY CARE PHYSICIAN:  Dr. Brunetta Genera.   HISTORY OF PRESENT ILLNESS:  The patient is a 80 year old African American male with past medical history significant for history of hypertension, diabetes mellitus, stroke, BPH, renal failure, who presented to the hospital with altered mental status.  In the hospital in the Emergency Room he was noted to be febrile with a temperature of 101.7.  He was also tachycardic with a heart rate of 126 and was noted to have right foot ulceration and hospitalist services were contacted for admission.  The patient himself is complaining of bilateral knee pain as well as swelling.  He admits of having some right foot heel wound for the past three months with increasing pain in the heel.  He tells me that he cannot walk much or play basketball because of that wound.  The patient was evaluated by wound care in the past and discharged from wound care center.  Apparently he was admitted in December 2014 and at that time bilateral heel wounds were noted.   PAST MEDICAL HISTORY:  Significant for history of acute encephalopathy admission in December 2014 which is felt to be due to dehydration.  Also history of hypernatremia, malignant hypertension, acute renal failure, diabetes mellitus type 2, history of CVA, benign prostatic hypertrophy and bilateral heel pressure ulcerations.  At that time they were likely infected.  No infection was documented.  Past medical history is also significant for chronic constipation, emphysema as well as TIA.   PAST SURGICAL HISTORY:  Back surgery, cholecystectomy, colonoscopies, recent exploratory laparotomy with enterotomy as well as adhesiolysis for  complete small bowel obstruction in October 2014.   SOCIAL HISTORY:  The patient lives in a nursing home [place] home care, has two sons and two daughters, used to work as a Retail buyer.   FAMILY  HISTORY:  Difficult to obtain as the patient does not remember much.   REVIEW OF SYSTEMS:   CONSTITUTIONAL:  Positive for feeling feverish and chilly, having pains in his legs and swelling in his knees as well as a heel wound and foot pain worsening over a period of 2 or 3 months now.  Denies any weight loss or weight gain.  EYES:  Denies any blurry vision, double vision, glaucoma or cataracts.  EARS, NOSE, THROAT:  Denies any tinnitus, allergies, epistaxis, sinus pain, dentures or difficulty swallowing.  PULMONARY:  Denies any wheezing, asthma, COPD. CARDIOVASCULAR:  Denies chest pains, orthopnea, edema, arrhythmias, palpations or syncope. GASTROINTESTINAL:  Denies any nausea, vomiting, diarrhea or constipation.  GENITOURINARY:  Denies dysuria, hematuria, frequency or incontinence.  According to medical records from nursing home, the patient is incontinent to bowel as well as urine. MUSCULOSKELETAL:  Denies arthritis, cramps, swelling, gout except as noted above.  NEUROLOGICAL:  No numbness, epilepsy or tremor.  PSYCHIATRIC:  Denies anxiety, insomnia, depression.   PHYSICAL EXAMINATION: VITAL SIGNS:  On arrival to the hospital the patient's vital signs:  Temperature is 101.7, pulse 126, respiratory rate was 18, blood pressure 159/74, saturation was 95% on room air.  GENERAL:  This is a well-developed, well-nourished African American male in no significant distress, lying on the stretcher.  He is somewhat dazed sitting on the stretcher.  HEENT:  Pupils are equally round, reactive to light.  Extraocular movements are intact.  No icterus or conjunctivitis.  No pharyngeal erythema.  Mucosa is dry.  NECK:  No masses,  supple, nontender.  Thyroid not enlarged.  No adenopathy.  No JVD or carotid bruits bilaterally.  Full range of motion.  LUNGS:  Clear to auscultation in all fields.  No rales, rhonchi, diminished breath sounds or wheezing.  No labored inspiration, increased effort, dullness to percussion  or overt respiratory distress. CARDIOVASCULAR:  S1, S2 appreciated.  No murmurs, rubs or gallops noted.  PMI not lateralized.  Chest is nontender to palpation. +1 pedal pulses.  No lower extremity edema, calf tenderness or cyanosis.  ABDOMEN:  Soft, nontender.  Bowel sounds are present.  No hepatosplenomegaly or masses were noted.  RECTAL:  Deferred.  MUSCLE STRENGTH:  Able to move all extremities; however, the patient does have bilateral knee effusions, but no increased warmth or skin redness or pain on movement.  The patient has almost intact movements in his knees.  The patient does have however have a wound on the right heel which has black eschar and significant heel actual calcaneus pain on palpation of the foot itself and calcaneus bone.  The wound is unstageable; however, I am not able to see any purulent discharge or redness or increased warmth around the wound itself.  The wound is about approximately 1 inch in diameter.  Significant swelling was noted in his feet on the right side; however, on the left side as well.  Peripheral pulses are palpable, however diminished.  SKIN:  Did not reveal any rashes, lesions, erythema, nodularity or induration apart from above.  LYMPHATIC:  No adenopathy in the cervical region.  NEUROLOGIC:  Cranial nerves grossly intact.  Sensory is intact.  No dysarthria or aphasia.  The patient is alert, oriented to person and place, cooperative.  Memory is impaired.  No significant confusion, agitation or depression noted.   LABORATORY AND RADIOLOGICAL DATA:  BMP showed glucose of 179, BUN and creatinine were 20 and 1.66, bicarbonate level was 29, otherwise BMP was unremarkable.  Calcium level was 7.9, albumin level of 2.0, total bilirubin was 1.2, elevated AST 268.  Otherwise, liver enzymes were normal.  White blood cell count is elevated at 13.5, hemoglobin was 9.8, platelet count was 400, absolute neutrophil count was 10.0.  Influenza testing was negative.  Urinalysis,  yellow clear urine, negative for bilirubin or ketones.  Specific gravity was 1.013, glucose 50 mg/dL, pH was 6.0, 1+ blood, more than 500 protein, negative for nitrites or leukocyte esterase, 1 red blood cell, 2 white blood cells.  No bacteria were seen, less than 1 epithelial cell.  Lactic acid level was 1.6.  Chest x-ray, PA and lateral, 08/14/2013 showed no active cardiopulmonary disease.   ASSESSMENT AND PLAN: 1.  Systemic inflammatory response syndrome.  Admit the patient to the medical floor.  Start him on vancomycin as well as Zosyn.  Very likely the patient's systemic inflammatory response syndrome is coming from wound in his right heel.  We will get podiatry involved and we will also get x-ray of his wound as well as erythrocyte sedimentation rate will be checked as well as CPR.  2.  Right heel wound, as mentioned above rule out osteomyelitis.  Get x-ray of the wound.  The patient will likely need to have bone scan done as well, sedimentation rate will be done as well as CPR and podiatry consultation's recommendations will be followed.  3.  Bilateral knee effusions.  We will get uric acid level, rule out gout.  We will start prednisone if needed.  4.  Acute renal failure.  We will continue  IV fluids, follow the patient's kidney function in the morning.  We will get also bladder scan to rule out obstruction.  5.  Elevated transaminases.  We will get CK level as well as bilirubin levels.  6.  Anemia.  Get guaiac.  7.  Leukocytosis.  We will follow with antibiotic therapy.   TIME SPENT:  50 minutes.     ____________________________ Theodoro Grist, MD rv:ea D: 08/14/2013 21:07:24 ET T: 08/14/2013 23:49:18 ET JOB#: [id number]  cc: Theodoro Grist, MD, <Dictator> Meindert A. Brunetta Genera, MD Theodoro Grist MD ELECTRONICALLY SIGNED 09/15/2013 20:48

## 2014-11-06 NOTE — Discharge Summary (Signed)
PATIENT NAME:  SAMUL, MCINROY MR#:  [id number] DATE OF BIRTH:  04-02-1935  DATE OF ADMISSION:  08/14/2013 DATE OF DISCHARGE:  08/17/2013  PRIMARY CARE PHYSICIAN:  Meindert A. Brunetta Genera, MD  CONSULTANTS HERE: Larkin Ina A. Vickki Muff, DPM, from podiatry and Vergia Alberts. Gray Bernhardt, MD, from orthopedics surgery.   CHIEF COMPLAINT: Brought in for fever.   DISCHARGE DIAGNOSES: 1.  Systemic inflammatory response syndrome, unclear etiology.  2.  Chronic right heel ulcer.  3.  Chronic kidney disease.  4.  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  5.  Hypertension.  6.  History of acute encephalopathy in December 2014 which was felt to be secondary to dehydration.  7.  History of hypernatremia.  8.  History of malignant hypertension.  9.  Diabetes.  10.  History of stroke.  11.  Benign prostatic hypertrophy.  12.  Bilateral heel pressure ulcerations.  13.  Chronic constipation.  14.  History of emphysema as well as transient ischemic attack.   DISCHARGE MEDICATIONS: Magnesium oxide 400 mg once a day, vitamin D3 2000 units once a day, pantoprazole 40 mg once a day, tamsulosin 0.4 mg once a day, Avodart 0.5 mg once a day, probiotic 1 cap once a day, multivitamin 1 tab once a day, MiraLax 17 grams orally drink in 8 ounces of water once a day, docusate sodium 100 mg 2 times a day, Levemir 28 units once a day in the morning and 25 units at bedtime, gabapentin 300 mg 2 times a day, metoprolol titrate 50 mg 2 times a day, metoclopramide 10 mg 3 times a day with meals, MicroGuard 2% topical powder applied topically to groin 2 times a day, sliding scale insulin, oxycodone 5 mg every 4 hours as needed for pain, clonidine 0.2 mg 2 times a day, amlodipine 10 mg once a day.   DIET: Low sodium, low fat, low cholesterol, ADA diet.   ACTIVITY: As tolerated.   FOLLOWUP: Please follow with PCP within 1 to 2 weeks. Please follow with podiatry, Dr. Vickki Muff, within 1 to 2 weeks. Please follow  with vascular surgery for your right foot circulation within 1 to 2 weeks.   DISPOSITION: Back to facility.   CODE STATUS: The patient is full code.   LABORATORY, DIAGNOSTIC AND RADIOLOGICAL DATA:  Initial BUN 20, creatinine of 1.6, sodium 143, potassium 3.5, total bilirubin of 1.2, uric acid 6.1. Initial troponin 0.07. Initial CK total was 664, peak CK total was 747, last CK total 228. Initial white count 13.5, last white count 11.6. ESR of 17. Initial hemoglobin 9.8. Urine cultures in-and-out cath on January 30 no growth to date. Blood cultures x 2 no growth to date. Rapid flu was negative, all on January 30th. Arthrocentesis on January 31st on bilateral knees did not show any significant lymphocytes or crystals, 1 had 4869 nucleated cells, the other one had 1271 neutrophils in one and 84 in the other, both have had no growth in 36 hours. Lactic acid is 1.6 on January 30. Chest, PA and lateral, x-ray on January 30 showed no active cardiopulmonary disease. Right foot complete x-rays showed no acute osseous abnormalities, no evidence of osteomyelitis, diffuse soft tissue swelling, osteoarthritis involving the mid foot and the first MTP joint. Bilateral knees AP and lateral x-rays showing tricompartmental osteoarthritis of bilateral knees.   HISTORY OF PRESENT ILLNESS AND HOSPITAL COURSE: For full details of H and P, please see the dictation on January 30 by Dr. Ether Griffins but briefly this is a 80 year old  male with multiple medical issues including hypertension, diabetes, BPH, CKD who came in for having confusion and was noted to have a fever of 101.7 and tachycardic to 126. He was also noted to have a right foot ulceration and we were asked for admission. He got admitted to the hospitalist service and at that time also was noted to have bilateral knee pain and swelling. He had not walked much for some time and is mostly in the bed, he states. He was started on broad-spectrum antibiotics including vancomycin  and Zosyn to cover systemic inflammatory response syndrome. He was also covered for the right heel for this. Podiatry was consulted. ESR was also sent. Per podiatry, the foot is likely not the cause of the systemic inflammatory response syndrome. All cultures thus far have been negative including the urine cultures, blood cultures, rapid flu. X-ray of the chest is negative and at this point he has had a couple of days of IV antibiotics and as there is no source isolated we have stopped antibiotics. The SIRS has resolved. Furthermore, he was seen by orthopedics for possible septic joint evaluation and he underwent arthrocentesis of both knees for the effusions. This was not suggestive of a septic picture as well as cultures from the tap have been negative so far. Furthermore, no crystals were seen to suggest he is having a gout flare. He has had no further fevers and the symptoms of pain in the knees and the right lower extremity have significantly improved. He was seen by Dr. Vickki Muff for the right heel ulceration and that was deemed not to be the source of the SIRS as well and this area does not appear to have any significant cellulitis or infection. The posterior aspect of the right heel has a 3 cm central eschar, decubitus type of ulcer with surrounding blister material and this was debrided down. At this point, we have stopped all the antibiotics and per podiatry, a vascular surgery followup is warranted although not emergent. I will see if we can make an appointment for vascular surgery for this patient. His altered mental status has resolved and he is back to normal. Suspect that the altered mental status was secondary to fever, which has resolved and metabolic in nature. In regards to his hypertension, we have increased his clonidine and added amlodipine for better control of his blood pressure. His chronic renal failure stage 3 is stable. He did have a mild elevation of CK total and this was likely not  rhabdomyolysis and that has resolved. He had mild troponin elevation which was likely secondary to renal failure and he has had no chest pain. At this point, he will be discharged back to his facility.   TOTAL TIME SPENT: About 40 minutes.   ____________________________ Vivien Presto, MD sa:cs D: 08/17/2013 14:49:59 ET T: 08/17/2013 15:20:54 ET JOB#: [id number]  cc: Vivien Presto, MD, <Dictator> Justin A. Vickki Muff, DPM Meindert A. Brunetta Genera, MD Vivien Presto MD ELECTRONICALLY SIGNED 08/29/2013 10:54

## 2014-11-06 NOTE — Consult Note (Signed)
PATIENT NAME:  Alan Mcintyre, Alan Mcintyre MR#:  [id number] DATE OF BIRTH:  06/24/1935  DATE OF CONSULTATION:  01/11/2014  REFERRING PHYSICIAN:  Dr. Rima Vaickute  CONSULTING PHYSICIAN:  David P. Fitzgerald, MD  REASON FOR CONSULTATION: Infected heel ulcer.   HISTORY OF PRESENT ILLNESS: A 80-year-old gentleman with history of diabetes, hypertension, colon cancer, transient ischemic attacks, peripheral vascular disease, admitted June 26 with dehydration and urinary discoloration. He is a poor historian, but apparently has been living at [facility] for five years. He has had peripheral vascular disease and has had angiograms by Dr. Schnier in April. He has a previous history of multiple infections including on his buttocks and his abdomen.   PAST MEDICAL HISTORY:  1.  Chronic kidney disease.  2.  Peripheral vascular disease.  3.  Diabetes.  4.  Hypertension.  5.  Chronic obstructive pulmonary disease.  6.  Transient ischemic attack.  7.  Chronic right and left heel ulcers.  8.  Osteoarthritis.  9.  Dementia.  10.  Constipation.  11.  Hepatitis C.  12.  History of prior drug use.    PAST SURGICAL HISTORY: Back surgery, cholecystectomy, enterostomy, adhesiolysis.  SOCIAL HISTORY: He lives in [facility]. Has two sons, two daughters. Unclear if he smokes currently.   FAMILY HISTORY: Noncontributory.   REVIEW OF SYSTEMS: Unable to be obtained.   ANTIBIOTICS SINCE ADMISSION: Include vancomycin and meropenem.   PHYSICAL EXAMINATION: VITAL SIGNS: Temperature 98.4, pulse 74, blood pressure 153/75, respirations 18, sat 98% on room air.  GENERAL: He is disheveled, lying in bed, not in any acute distress.  HEENT: Pupils are equal, round, and reactive to light and accommodation. His extraocular movements are intact. His sclerae are anicteric. His oropharynx is clear.  NECK: Supple.  HEART: Regular.  LUNGS: Clear to auscultation bilaterally.  ABDOMEN: Distended, soft, nontender.   EXTREMITIES: He has chronic dry skin, bilateral lower extremities. On his bilateral heels, he has deep ulcerations. The left has quite a large amount of purulent drainage. The right has better granulation tissue.  NEUROLOGIC: He is alert but relatively noncommunicative.   DIAGNOSTIC DATA: White blood count on admission 7.0, hemoglobin 9.0, platelets 395. Sedimentation rate was 39. Renal function shows creatinine of 2.46, down from 2.7. Cultures of the heel are growing mixed organisms, including, from the left heel, Enterococcus faecalis sensitive to ampicillin; Proteus, sensitive to cefazolin, ampicillin, ceftriaxone, ceftazidime, imipenem, Bactrim, cefoxitin; Providencia sensitive to ceftriaxone, ceftazidime, imipenem. I have reviewed other cultures going back a year. He has not had any MRSA identified.   IMPRESSION: A 80-year-old gentleman, peripheral vascular disease, diabetes, relatively well-controlled, is bedbound, and has chronic nonhealing ulcers. The left heel is quite purulent and necrotic. No evidence of methicillin-resistant Staphylococcus aureus. He also has renal insufficiency.    RECOMMENDATIONS: 1.  Discontinue vancomycin, as no evidence of MRSA resistant organisms.  2.  Continue meropenem for now.  3.  I agree with vascular surgery assessment, podiatry assessment. Will need aggressive wound care and alleviation with pressure on the heels. His ESR is not markedly elevated. Depending on blood return after the planned angiogram, may move the IV antibiotics to oral. There is minimal evidence of possible osteomyelitis on the right x-ray, with no evidence on the left.   Thank you for the consult. I will be glad to follow with you.  ____________________________ David P. Fitzgerald, MD dpf:cg D: 01/11/2014 21:37:44 ET T: 01/12/2014 02:44:38 ET JOB#: [id number]  cc: David P. Fitzgerald, MD, <Dictator> DAVID FITZGERALD MD   ELECTRONICALLY SIGNED 02/01/2014 21:11
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
## 2014-11-06 NOTE — Discharge Summary (Signed)
PATIENT NAME:  Alan Mcintyre, Alan Mcintyre MR#:  [id number] DATE OF BIRTH:  1935/01/04  DATE OF ADMISSION:  01/08/2014 DATE OF DISCHARGE:  01/12/2014  ADMITTING DIAGNOSES: Acute renal failure.   DISCHARGE DIAGNOSES: 1.  Acute on chronic renal failure, likely multifactorial due to dehydration and hypertension as well as possibly recent vascular procedure in April 2015 related disorder on IV fluids with creatinine level of 1.79 on 01/12/2014 which is close to the patient's baseline.  2.  Bilateral heel pressure ulcerations with multibacterial pathogens with  Providencia, Proteus as well as Enterococcus faecalis. Cellulitis and suspected right calcaneus osteomyelitis, not candidate for surgery per podiatry.  3.  Peripheral vascular disease, status post right lower extremity vascular intervention in April 2015 by Dr. Lucky Cowboy.  4.  Anemia of chronic disease, status post 20,000 units of Procrit administration during this admission.  5.  Hypertension.  6.  Diabetes mellitus, not insulin-dependent with morning hypoglycemia with hemoglobin A1c less than 3.5 in March 2015.  7.  Diabetic nephropathy, neuropathy, gastroparesis.  8.  Chronic kidney disease.  9.  Benign prostatic hypertrophy.  10.  Chronic obstructive pulmonary disease.  11.  Dementia.  12.  Bedbound.   DISCHARGE CONDITION: Stable.   DISCHARGE MEDICATIONS:  1.  The patient is to continue vitamin D3 2000 units once daily.  2.  Pantoprazole 40 mg p.o. daily.  3.  Tamsulosin 0.4 mg p.o. daily.  4.  Avodart 0.5 mg p.o. daily.  5.  Probiotic formula 1 capsule once daily.  6.  Multivitamins once daily.  7.  MiraLax 17 grams p.o. twice daily.  8.  Gabapentin 300 mg p.o. twice daily.  9.  Reglan 10 mg 3 times daily with meals.  10.  NovoLog sliding scale.  11.  Docusate 100 mg p.o. twice daily.  12.  Santyl topical ointment to wound once daily. The patient's wound should be kept a piece of calcium alginate into the sacrum and right ischial wounds once a  day, cover with foam dressing. Change foam dressing every 3 days or as needed for swelling. Wound care is recommended.   13.  Aspirin 81 mg p.o. daily.  14.  Plavix 75 mg p.o. daily.  15.  Magnesium oxide 400 mg p.o. daily.  16.  Tylenol 500 mg 2 tablets every 4 hours as needed.  17.  Tramadol 25 mg every 4 hours as needed.  18.  Levemir 20 units subcutaneously once in the morning.  19.  Metoprolol tartrate 100 mg p.o. twice daily.  20.  Amlodipine 10 mg p.o. daily.  21.  Meropenem 500 mg IV every 12 hours. The patient is to use those medications per ID recommendations.  22.  Senna 1 tablet twice daily as needed.   HOME OXYGEN: None.   DIET: Two grams salt, low fat, low cholesterol, carbohydrate-controlled diet, regular consistency.   ACTIVITY LIMITATIONS: As tolerated.   REFERRALS: To physical therapy.    FOLLOWUP APPOINTMENTS: Vascular surgery in 1 to 2 days after discharge as well as podiatry and ID consult as needed.   CONSULTANTS: Care management, social work, Dr. Delana Meyer, Dr. Ola Spurr, Dr. Lucky Cowboy, Dr. Vickki Muff.   RADIOLOGIC STUDIES: Chest, portable, single view x-ray, 26th of June 2015, revealed no acute findings. Ultrasound of kidneys, bilateral, 28th of June 2015, revealed no acute process or explanation for acute renal failure, lower pole of left renal area lesion favored to represent a cyst or many, many complex cysts, consider ultrasound followup at 6 to 12 months to confirm size stability.  Right heel x-ray done on the 24th of June 2015, revealed a large soft tissue defect noted along the posterior soft tissues of the calcaneus, associated extremity subtle erosion of the distalmost aspect of the calcaneal cortex was noted. This suggests possibility of very mild changes of osteomyelitis. Left heel calcaneus x-ray, 24th of June 2015, showed no plain film evidence of osteomyelitis.   HOSPITAL COURSE:  The patient is a 80 year old African American male with past medical history  significant for history of bilateral heel wounds, chronic bilateral heel wounds, who presents to the hospital with complaints of dehydration and acute kidney injury as well as changes of urinary frequency.  Please refer to Dr. Loma Sender admission note on 26th of June 2015. On arrival to the hospital, patient complained of being incontinent and having frequent urination. He was also noted to have foul-smelling urine apparently by nursing staff. On arrival to the hospital, patient was noted to be in acute on chronic renal failure with creatinine level of 2.76 on the 26th of June 2015 with a BUN level of 37, baseline being around 1.6 according to prior studies. His potassium level was also found to be high at 5.9 and hospitalist services were contacted for admission. His vital signs on the day of admission were remarkable for a markedly elevated blood pressure at 173/79. He was afebrile with temperature of 97.8, pulse was 84, respiration rate was 18 and oxygen saturation was 99% on room air. Chest x-ray was unremarkable. The patient's physical exam revealed significant erosions, ulcerations of his heel as well as his feet bilaterally. He was admitted to the hospital for further evaluation.   He was started on broad-spectrum antibiotic therapy and consultation with podiatrist was obtained. Dr. Vickki Muff saw the patient in consultation on the 28th of June 2015. Dr. Vickki Muff felt that patient has significant bilateral heel ulcers, however, he does not ambulate. He recommended no surgery for this patient. He recommended to have vascular surgery consulted since his peripheral pulses were found to be poor. The patient was consulted by Dr. Lucky Cowboy on the 29th of June 2015, and felt that the patient may need to have indication for left lower extremity therapy as prior right lower extremity revascularization done in April 2015. Dr. Delana Meyer was about to take the patient to Operating Room for vascular procedure 30th of June  2015; however, his glucose level was found to be low in 50s so it was felt that the patient's angiogram should be postponed since the patient was also somewhat obtunded and minimally responsive. He was given dextrose and his blood glucose levels were rechecked. He improved clinically with glucose administration. Social service consultation was obtained and the patient was advised to be transferred [facility] facility where he can get vascular procedure done, for which the patient was agreeable. The patient will be discharged to [facility] hospital for further management of his peripheral vascular disease as well as his cellulitis, suspected osteomyelitis.   The patient was consulted by Dr. Ola Spurr, infectious disease specialist, who recommended to discontinue vancomycin since wound cultures taken superficially showed only Providencia as well as Proteus as well as Enterococcus faecalis but no evidence of MRSA resistant organisms.  He recommended, however, to continue meropenem for now and aggressive wound care and elevation of feet to take also off pressure of the heels. Depending on angiogram results, Dr. Ola Spurr felt that the patient's IV antibiotics may be discontinued and changed to oral. According to Dr. Ola Spurr, there was minimal evidence of possible osteomyelitis  on the right side; however, no evidence on the left. However, the patient is to continue meropenem for now, until further cultures obtained as well as vascular intervention is performed.   Of note, the patient's kidney function with IV fluid administration improved. On the day of admission, the patient's creatinine was 2.76. With IV fluid administration, the patient's creatinine is 1.79 today, on the 30th of June 2015, most recent creatinine being 1.48 in April 2015. He is being discharged in stable condition with the above-mentioned medications and followup. On the day of discharge, the patient's vital signs, temperature was 98.1, pulse was 85,  respiration rate was 20, blood pressure 166/75, saturation was 99% on room air at rest.   Of note, in regards to other medical issues, in regards to diabetes mellitus, the patient's insulin Lantus was decreased to 20 units subcutaneously daily, down from 28 units given yesterday. The patient had a hemoglobin A1c checked in March 2015. At that time, it was below 3.5. It is recommended to decrease even lower if possible, following patient for morning hypoglycemia episodes. In regards to diabetic nephropathy, neuropathy as well as gastroparesis, the patient is to continue his outpatient medications. For dementia, he is to continue supportive therapy. For bedbound status, the patient is to continue physical therapy if possible. For chronic obstructive pulmonary disease, patient was not hypoxic and had no symptomatology whatsoever. We did not initiate him on any additional medications.   TIME SPENT: 40 minutes.   Of note, patient's vascular procedure done in April 2015 is as follows:  Abdominal aortogram, right lower extremity distal runoff, third order catheter placement, additional third order catheter placement, percutaneous transluminal angioplasty with 6 mm maximal Lutonix balloon right superficial femoral artery done by Dr. Hortencia Pilar.   ____________________________ Theodoro Grist, MD rv:cs D: 01/12/2014 14:18:05 ET T: 01/12/2014 15:02:42 ET JOB#: [id number]  cc: Theodoro Grist, MD, <Dictator> LTAC RIMA VAICKUTE MD ELECTRONICALLY SIGNED 02/03/2014 5:46

## 2014-11-06 NOTE — H&P (Signed)
PATIENT NAME:  Alan Mcintyre, ENBERG MR#:  [id number] DATE OF BIRTH:  1934/08/22  DATE OF ADMISSION:  10/10/2013  CHIEF COMPLAINT: Altered mental status and drowsiness.   HISTORY OF PRESENT ILLNESS: A 80 year old African-American male patient with past history of CKD III, dementia, hypertension, diabetes, chronic lower extremity ulceration, and arthritis. Presents to the Emergency Room, sent in from assisted living facility after he was found to be very drowsy and confused. The patient seems to have improved a little in the Emergency Room, but still drowsy. Initially, he was thought to have possible septic arthritis with his bilateral knee effusions, with left greater than right. Had arthrocentesis of the left knee, which has shown 2800 nucleated cells, with 95% neutrophils, bloody fluid. Afebrile, normal white count. The patient is being admitted for dehydration, acute renal failure, and acute encephalopathy.   The patient did receive a dose of Unasyn in the Emergency Room prior to the tap for suspicion for septic arthritis.   PAST MEDICAL HISTORY: 1.  Chronic right heel ulcer.  2.  Chronic kidney disease.  3.  Severe tricompartmental arthritis of the knees bilaterally with effusion, which is chronic.  4.  Hypertension.  5.  History of encephalopathy in December 2014 and February 2015.  6.  Dementia.  7.  Hypernatremia.  8.  Malignant hypertension.  9.  Insulin-dependent diabetes mellitus.  10.  CVA.  11.  BPH.   12.  Bilateral heel pressure ulcerations. 13.  Chronic constipation.  14.  Emphysema.  15.  TIA.  16.  Bedbound status.   PAST SURGICAL HISTORY: Back surgery, cholecystectomy, colonoscopy, exploratory laparotomy, enterostomy, adhesiolysis.   SOCIAL HISTORY: The patient lives at an assisted living facility in [place]. Has 2 sons and daughters. Used to work as a Retail buyer in the past.  CODE STATUS:  [place].   FAMILY HISTORY: The patient does not remember medical history of his  parents on review.  REVIEW OF SYSTEMS: Unobtainable secondary to his encephalopathy.   HOME MEDICATIONS: 2.  Avodart 0.5 mg oral daily. 3.  Clonidine 0.2 mg oral 2 times a day. 4.  Gabapentin 300 mg oral 2 times a day.  5.  Levemir 20 units subcutaneous in the morning and 25 units at night.  6.  Magnesium oxide 400 mg oral once a day. 7.  Meloxicam 7.5 mg oral 2 times a day.  8.  Metoclopramide 10 mg oral 3 times a day.  9.  Metoprolol tartrate 50 mg oral 2 times a day.  10.  MiraLAX 17 grams oral 2 times a day. 11.  Multivitamin 1 tablet daily.  12.  NovoLog sliding scale.  13.  Protonix 40 mg daily. 14.  Probiotic formula 1 capsule daily. 15.  Tamsulosin 0.4 mg oral once a day.  16.  Tylenol 5 mg 1 to 2 tablets every 4 hours as needed for pain.  17.  Vitamin D3, 2000 international units oral once a day.   PHYSICAL EXAMINATION: VITAL SIGNS: Temperature is 98.1, pulse of 79, blood pressure 122/54, saturating 98% on room air.  GENERAL: A moderately built African-American male patient, lying in bed, drowsy. Wakes up on calling his name.  PSYCHIATRIC:  Is drowsy. Oriented to time, but not to place. Pleasant.  HEENT:  Normocephalic, atraumatic. Oral mucosa dry and pink. Pallor positive. No icterus. Pupils bilaterally equal and react to light.  NECK: Supple. No palpable lymph nodes. Trachea midline. No carotid bruit, JVD.  CARDIOVASCULAR: S1, S2, without any murmurs. Peripheral pulses 2+. No edema.  RESPIRATORY: Normal work of breathing. Clear to auscultation on both sides.  GASTROINTESTINAL:  Soft abdomen, nontender. Bowel sounds present. No organomegaly palpable.  SKIN: Warm and dry. Has chronic ulcerations on the right heel and also dorsum of the right foot. Scaly, dry skin of both extremities.  MUSCULOSKELETAL: Has swelling of bilateral knees, left greater than right. Tender.  NEUROLOGICAL: Motor strength decreased in lower extremities, 4/5 in upper extremities, but symmetrical.    LABORATORY STUDIES: Show glucose 143, BUN 22, creatinine 1.67, sodium 143, potassium 3.8, chloride 109, bicarb 30, GFR 39. AST, ALT, alkaline phosphatase normal. Albumin 2.5. Troponin less than 0.02. TSH of 3.05. WBC 10.5, with hemoglobin 9.3. ESR  of 47. Platelet count 456, with neutrophils 71%, no bands. Synovial fluid showed 2400 nucleated cells, with 95% neutrophils, red and bloody fluid. No crystals. Gram stain has shown no organisms, few white blood cells.   EKG has shown normal sinus rhythm, no acute ST-T wave changes.   CT scan of the head without contrast shows diffuse atrophy, no acute strokes or bleed.   Chest x-ray portable, showed no acute disease.   Left knee complete showed soft tissue swelling and effusion, moderate to large.   ASSESSMENT AND PLAN: 1.  Acute encephalopathy, likely secondary to dehydration and acute renal failure in a patient with a creatinine of 1.67, baseline creatinine 1.1. Will admit the patient onto the medical floor. Start him on IV fluids, and closely monitor. He does seem to have baseline dementia, has had recurrent admissions for the same. Will monitor over 24 hours and see how the patient does. CT scan of the head showed nothing acute. Will check a urinalysis for any UTI, which could be contributing to his encephalopathy. If this is positive, patient will need antibiotics.   2.  Bilateral knee effusion. The patient has had left knee arthrocentesis, which showed bloody fluid. This does not suggest septic arthritis, per the synovial fluid analysis has minimal inflammation. Will consult Orthopedics for further input with the case. No antibiotics at this time, can wait for final cultures. No crystals found in the fluid.   3.  Dementia. Watch for any inpatient delirium.   4.  Chronic lower extremity ulcers. Will consult Wound Care.   5.  Diabetes mellitus. Continue the Lantus and sliding scale insulin.   6.  Hypertension. Continue medications.   7.   Deep vein thrombosis prophylaxis with Lovenox.   CODE STATUS: FULL CODE.   Time spent today on this case was 45 minutes.     ____________________________ Leia Alf Orman Matsumura, MD srs:mr D: 10/10/2013 19:04:05 ET T: 10/10/2013 20:14:10 ET JOB#: [id number]  cc: Alveta Heimlich R. Takia Runyon, MD, <Dictator> Neita Carp MD ELECTRONICALLY SIGNED 10/11/2013 13:21
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
## 2014-11-06 NOTE — H&P (Signed)
PATIENT NAME:  Alan Mcintyre, Alan Mcintyre MR#:  [id number] DATE OF BIRTH:  1934-10-01  DATE OF ADMISSION:  01/08/2014  The patient comes from [place].   REASON FOR ADMISSION: Dehydration, acute kidney injury, changes in urinary frequency.   HISTORY OF PRESENT ILLNESS: This is a very nice 80 year old gentleman with a history of diabetes, hypertension, history of colon cancer, emphysema, previous GI bleeding, TIA, etc., comes today with a history of nurses noticing that his urine was very heavy smelling and he was incontinent when he is not necessarily like that. The patient states that he has been feeling okay. He does not have any significant issues, but he feels like he has not been drinking enough fluids.   The patient is a very poor historian and is really not able to give me much information. He is at the [facility] care home and he has been there for 5 years after he was discharged from jail. The patient had a recent hospitalization for an angiogram, which was done back in April by Dr. Delana Meyer due to peripheral vascular disease. At that moment, the procedure was an aortogram, right lower extremity distal runoff with PTA catheter placement, percutaneous transluminal angioplasty of the right SFA.    The patient also was seen by Dr. Oliva Bustard due to anemia of chronic disease, which has been stable. Recently had x-rays of the lower extremities on June 24 and right calcaneus showed changes of osteoporosis. This issue apparently has not been addressed. He has history of previous MRSA. At this moment, the patient is going to be admitted just to treat his acute kidney injury and monitor vancomycin levels, as we started him on vancomycin for osteomyelitis, and an MRI of the heel.   The patient, again, not a good historian; not able to give me much information. Most of the history was obtained through old records.   REVIEW OF SYSTEMS:  A 12 system review of systems is done.  CONSTITUTIONAL:  No fever,  fatigue, weight loss or weight gain.  EYES: No blurry vision or double vision.  EARS, NOSE, THROAT:  No difficulty swallowing, tinnitus.  RESPIRATORY:  No cough, wheezing or hemoptysis.  CARDIOVASCULAR:  No chest pain or orthopnea.  GASTROINTESTINAL:  No nausea, vomiting, abdominal pain, constipation, diarrhea.  GENITOURINARY:  No dysuria. Positive incontinence. Positive for changes in frequency and concentrated urine.  ENDOCRINE: No polyuria or polydipsia.  HEMATOLOGIC AND LYMPHATIC:  Positive anemia on studies by Dr. Oliva Bustard. No easy bruising.  MUSCULOSKELETAL:  No neck pain or back pain at this moment. Positive pain at the level of the lower extremities.  SKIN:  No rashes or petechiae. Positive decubitus ulcer on calcaneus area.  PSYCHIATRIC:   No anxiety or depression at this moment; they are controlled.  NEUROLOGIC:  No numbness, tingling. Positive TIA in the past.   PAST MEDICAL HISTORY: 1.  Hepatitis C.  2.  History of IV drug abuse, cocaine and heroin.  3.  Chronic kidney disease.  4.  Colon cancer.  5.  Peripheral vascular disease.  6.  Type 2 diabetes.  7.  Hypertension.  8.  History of previous dysphagia.  9.  History of GI bleeding.  10.  COPD.  11.  TIA.  12.  Osteoarthritis.  13.  Chronic right and left heel ulcers.  14.  Tricompartmental arthritis of both knees.  15.  History of encephalopathy in the past.  16.  Dementia.  17.  Chronic constipation.  18.  Bedbound.   PAST SURGICAL  HISTORY: 1.  Back surgery.  2.  Cholecystectomy.  3.  Exploratory laparotomy.  4.  Enterostomy.  5.  Adhesiolysis.   SOCIAL HISTORY: The patient lives at the [facility].  Has 2 sons, 2 daughters. He used to be a Retail buyer in jail.  After jail went to the nursing facility.   FAMILY HISTORY:  The patient states that his mother had breast cancer and there is no history of heart attacks or diabetes.   SOCIAL HISTORY:  The patient quit smoking 1 year ago. He used to do cocaine and  heroin IV. He does not drink alcohol. He is a resident of [place] facility.    CURRENT MEDICATIONS:  Vitamin D3, Tylenol, tramadol 50 mg take 1/2 tablet 4 times a day, Flomax 0.4 mg daily, Santyl as needed for wound, probiotic formula once a day, Protonix 40 mg daily, NovoLog sliding scale, multivitamins once daily, MiraLAX 17 grams once daily, metoprolol 50 mg twice daily, metoclopramide 2 mg 3 times daily, magnesium oxide 400 mg daily, Levemir 28 units once at night, gabapentin 300 mg twice daily, docusate 100 mg twice daily, Plavix 75 mg once daily, aspirin 81 mg once daily, amlodipine 5 mg once daily.   PHYSICAL EXAMINATION: VITAL SIGNS:  Blood pressure is 131/81, pulse 74, respirations 18, temperature 98.  GENERAL:  The patient is alert and oriented to person and place. He is not quite sure what the time is or what the circumstances are. He is not quite sure why he has been sent here to the Emergency Department, but he has an idea it is because of his urine. The patient has the diagnosis of dementia, but right now he looks pleasant and cooperative. He is hemodynamically stable.  HEENT: Pupils are equal and reactive. Extraocular movements are intact. Mucosa is dry. Anicteric sclerae. Pink conjunctivae. No oral lesions. No oropharyngeal exudates.  NECK:  Supple. No JVD. No thyromegaly. No adenopathy. No carotid bruits.  CARDIOVASCULAR:  Regular rate and rhythm. No murmurs, rubs or gallops. No displacement of PMI. No tenderness to palpation on anterior chest wall.  LUNGS: Clear without any wheezing or crepitus. No use of accessory muscles.  ABDOMEN:  Soft, nontender, nondistended. No hepatosplenomegaly. No masses. Bowel sounds are positive.  EXTREMITIES:  No edema, cyanosis or clubbing. Pulses +2. Capillary refill less than 3. Positive ulcers on heels. The right ulcer on the heel is covered, was examined. There are some secretions and bad odor, but no frank purulent secretions.  NEUROLOGIC:   Cranial nerves II through XII intact. Strength of the lower extremities is 3/5, but he is equal on both sides. He is just generalized weak.  Upper extremities 4/5. DTRs +2.  PSYCHIATRIC:  Flat affect, but no agitation.  LYMPHATIC:  Negative for lymphadenopathy in neck or supraclavicular areas.  SKIN:  No rashes or petechiae. Ulcer as mentioned above.  MUSCULOSKELETAL:  No joint effusions at this moment.   LABORATORY DATA:  As mentioned above, June 24, heel on the right side looks like he has osteomyelitis. His glucose is 129, BUN 37, creatinine 2.76. His previous creatinine was around 1, sodium 135, potassium 5.9, total protein 7. Troponin is 0.02. White count is 7.9. ESR was slightly elevated at 39, hemoglobin 8.1 from a previous of 9, prior to that was 7. Platelet count 350. Urinalysis negative for signs of urinary tract infection. B12 level 475.   LDH slightly elevated at 254. Iron saturation is 22, TIBC 263, ferritin 234, likely anemia of chronic disease.  Folic acid is 19.   EKG:  No ST depression or elevation. Normal sinus rhythm.   Chest x-ray, done today, no acute findings.   ASSESSMENT AND PLAN:  A 80 year old gentleman with multiple medical problems with changes in urinary frequency with concentrated urine, as per nursing home.  1.  Acute kidney injury. The patient looks dehydrated. He is hemodynamically stable. Vital signs are stable, but the patient looks dry. His creatinine is above 2, when usually it is around 1. The patient is going to be admitted to the hospital. Avoid nephrotoxins. Continue IV fluids at 100 mL/hour, and try to get a good urine output measurement. Since the patient has a history BPH, we are going to do an ultrasound of the kidneys to evaluate the possibility of nephrolithiasis.  2. Dementia. Continue to monitor. The patient seems to be pleasant and stable, but very bad historian.  3.  Diabetes. The patient takes Levemir. Continue Levemir for now. Continue diet of  low-carb control.  4.  Neuropathy. The patient has diabetic neuropathy, for what he takes gabapentin. Monitor for kidney function changes.  5. Peripheral vascular disease. The patient on Plavix and aspirin; continue those 2 medications for now. No signs of bleeding.  6.  Hypertension. Continue metoprolol. Patient seems to be stable.  7.  Dysphagia with reflux, likely some degree of gastroparesis. The patient is on metoclopramide. Continue metoclopramide for now.  8.  Constipation. Continue MiraLAX.  9.  Deep vein thrombosis prophylaxis with heparin as the patient has acute kidney and injury.  10.  Gastrointestinal prophylaxis with Protonix.  Since the patient has changes that are consistent with osteomyelitis on an x-ray done 2 days ago, we are going to get an MRI and start him on empiric vancomycin. We are going to call podiatry for an evaluation and possible cultures, if possible.   I spent about 45 minutes with this patient    ____________________________ [place] Sink, MD rsg:dmm D: 01/08/2014 19:31:00 ET T: 01/08/2014 20:30:24 ET JOB#: [id number]  cc: [place] Sink, MD, <Dictator> ROBERTO America Brown MD ELECTRONICALLY SIGNED 01/28/2014 1:39

## 2014-11-06 NOTE — Consult Note (Signed)
Details:   - Pt seen and full note dicated. Righ heel decubitus type ulceration. Mild debridment of surrounding skinperformed and no infection noted. No areas of probing and central eschar is stable. Xrays are negative. Likely not source of SIRS. No further imaging recommended at this time as this appears stable and non-infected. If continued concern for infection from heel would recommend MRI. Dressing orders written. Will f/u in 2-3 days if pt still in house.   Can f/u with me in outpt setting in 2 weeks upon d/c. Recommend vascular surgery f/u as well, can be outpt as wound is not currently limb threatening.   Electronic Signatures: Samara Deist (MD)  (Signed 31-Jan-15 15:06)  Authored: Details   Last Updated: 31-Jan-15 15:06 by Samara Deist (MD)

## 2014-12-26 ENCOUNTER — Emergency Department
Admission: EM | Admit: 2014-12-26 | Discharge: 2014-12-26 | Disposition: A | Discharge status: Home / Self Care | Payer: Medicare Other | Attending: Emergency Medicine | Admitting: Emergency Medicine

## 2014-12-26 ENCOUNTER — Encounter: Payer: Self-pay | Admitting: *Deleted

## 2014-12-26 ENCOUNTER — Emergency Department: Payer: Medicare Other

## 2014-12-26 DIAGNOSIS — Z87891 Personal history of nicotine dependence: Secondary | ICD-10-CM | POA: Diagnosis not present

## 2014-12-26 DIAGNOSIS — E1140 Type 2 diabetes mellitus with diabetic neuropathy, unspecified: Secondary | ICD-10-CM | POA: Insufficient documentation

## 2014-12-26 DIAGNOSIS — Z794 Long term (current) use of insulin: Secondary | ICD-10-CM | POA: Insufficient documentation

## 2014-12-26 DIAGNOSIS — E1143 Type 2 diabetes mellitus with diabetic autonomic (poly)neuropathy: Secondary | ICD-10-CM | POA: Insufficient documentation

## 2014-12-26 DIAGNOSIS — I129 Hypertensive chronic kidney disease with stage 1 through stage 4 chronic kidney disease, or unspecified chronic kidney disease: Secondary | ICD-10-CM | POA: Diagnosis not present

## 2014-12-26 DIAGNOSIS — Z79899 Other long term (current) drug therapy: Secondary | ICD-10-CM | POA: Insufficient documentation

## 2014-12-26 DIAGNOSIS — T148 Other injury of unspecified body region: Secondary | ICD-10-CM | POA: Insufficient documentation

## 2014-12-26 DIAGNOSIS — W06XXXA Fall from bed, initial encounter: Secondary | ICD-10-CM | POA: Insufficient documentation

## 2014-12-26 DIAGNOSIS — Y9289 Other specified places as the place of occurrence of the external cause: Secondary | ICD-10-CM | POA: Insufficient documentation

## 2014-12-26 DIAGNOSIS — Y998 Other external cause status: Secondary | ICD-10-CM | POA: Diagnosis not present

## 2014-12-26 DIAGNOSIS — S299XXA Unspecified injury of thorax, initial encounter: Secondary | ICD-10-CM | POA: Insufficient documentation

## 2014-12-26 DIAGNOSIS — Z7902 Long term (current) use of antithrombotics/antiplatelets: Secondary | ICD-10-CM | POA: Diagnosis not present

## 2014-12-26 DIAGNOSIS — T148XXA Other injury of unspecified body region, initial encounter: Secondary | ICD-10-CM

## 2014-12-26 DIAGNOSIS — N183 Chronic kidney disease, stage 3 (moderate): Secondary | ICD-10-CM | POA: Diagnosis not present

## 2014-12-26 DIAGNOSIS — Y9389 Activity, other specified: Secondary | ICD-10-CM | POA: Diagnosis not present

## 2014-12-26 DIAGNOSIS — S8992XA Unspecified injury of left lower leg, initial encounter: Secondary | ICD-10-CM | POA: Insufficient documentation

## 2014-12-26 DIAGNOSIS — Z7982 Long term (current) use of aspirin: Secondary | ICD-10-CM | POA: Insufficient documentation
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
## 2014-12-26 NOTE — Discharge Instructions (Signed)
Contusion °A contusion is a deep bruise. Contusions happen when an injury causes bleeding under the skin. Signs of bruising include pain, puffiness (swelling), and discolored skin. The contusion may turn blue, purple, or yellow. °HOME CARE  °· Put ice on the injured area. °¨ Put ice in a plastic bag. °¨ Place a towel between your skin and the bag. °¨ Leave the ice on for 15-20 minutes, 03-04 times a day. °· Only take medicine as told by your doctor. °· Rest the injured area. °· If possible, raise (elevate) the injured area to lessen puffiness. °GET HELP RIGHT AWAY IF:  °· You have more bruising or puffiness. °· You have pain that is getting worse. °· Your puffiness or pain is not helped by medicine. °MAKE SURE YOU:  °· Understand these instructions. °· Will watch your condition. °· Will get help right away if you are not doing well or get worse. °Document Released: 12/19/2007 Document Revised: 09/24/2011 Document Reviewed: 05/07/2011 °[employer]® Patient Information ©2015 [employer]. This information is not intended to replace advice given to you by your health care provider. Make sure you discuss any questions you have with your health care provider. ° °

## 2014-12-26 NOTE — ED Notes (Signed)
[facility] requesting transportation for pt when needed. Center reported having no mode of transportation. EMS will need to be called upon pt discharge.

## 2014-12-26 NOTE — ED Notes (Signed)
Pt arrived via EMS from [facility] after unwitnessed fall this morning. Pt was found on ground at 6:00am this morning. Pt reporting left sided flank and leg pain.

## 2015-01-03 ENCOUNTER — Encounter: Payer: Self-pay | Admitting: *Deleted

## 2015-01-03 ENCOUNTER — Emergency Department
Admission: EM | Admit: 2015-01-03 | Discharge: 2015-01-03 | Disposition: A | Discharge status: Home / Self Care | Payer: Medicare Other | Source: Home / Self Care | Attending: Emergency Medicine | Admitting: Emergency Medicine

## 2015-01-03 DIAGNOSIS — N390 Urinary tract infection, site not specified: Secondary | ICD-10-CM | POA: Insufficient documentation

## 2015-01-03 DIAGNOSIS — Z7982 Long term (current) use of aspirin: Secondary | ICD-10-CM | POA: Insufficient documentation

## 2015-01-03 DIAGNOSIS — E871 Hypo-osmolality and hyponatremia: Secondary | ICD-10-CM | POA: Diagnosis not present

## 2015-01-03 DIAGNOSIS — Z87891 Personal history of nicotine dependence: Secondary | ICD-10-CM

## 2015-01-03 DIAGNOSIS — A419 Sepsis, unspecified organism: Principal | ICD-10-CM | POA: Diagnosis present

## 2015-01-03 DIAGNOSIS — J449 Chronic obstructive pulmonary disease, unspecified: Secondary | ICD-10-CM | POA: Diagnosis present

## 2015-01-03 DIAGNOSIS — Z794 Long term (current) use of insulin: Secondary | ICD-10-CM

## 2015-01-03 DIAGNOSIS — N179 Acute kidney failure, unspecified: Secondary | ICD-10-CM | POA: Diagnosis present

## 2015-01-03 DIAGNOSIS — I129 Hypertensive chronic kidney disease with stage 1 through stage 4 chronic kidney disease, or unspecified chronic kidney disease: Secondary | ICD-10-CM | POA: Insufficient documentation

## 2015-01-03 DIAGNOSIS — Z79899 Other long term (current) drug therapy: Secondary | ICD-10-CM

## 2015-01-03 DIAGNOSIS — E1143 Type 2 diabetes mellitus with diabetic autonomic (poly)neuropathy: Secondary | ICD-10-CM

## 2015-01-03 DIAGNOSIS — D631 Anemia in chronic kidney disease: Secondary | ICD-10-CM | POA: Diagnosis present

## 2015-01-03 DIAGNOSIS — Z803 Family history of malignant neoplasm of breast: Secondary | ICD-10-CM

## 2015-01-03 DIAGNOSIS — K219 Gastro-esophageal reflux disease without esophagitis: Secondary | ICD-10-CM | POA: Diagnosis present

## 2015-01-03 DIAGNOSIS — E870 Hyperosmolality and hypernatremia: Secondary | ICD-10-CM | POA: Diagnosis present

## 2015-01-03 DIAGNOSIS — N400 Enlarged prostate without lower urinary tract symptoms: Secondary | ICD-10-CM | POA: Diagnosis present

## 2015-01-03 DIAGNOSIS — Z87898 Personal history of other specified conditions: Secondary | ICD-10-CM

## 2015-01-03 DIAGNOSIS — D649 Anemia, unspecified: Secondary | ICD-10-CM

## 2015-01-03 DIAGNOSIS — D696 Thrombocytopenia, unspecified: Secondary | ICD-10-CM | POA: Diagnosis present

## 2015-01-03 DIAGNOSIS — Z7902 Long term (current) use of antithrombotics/antiplatelets: Secondary | ICD-10-CM

## 2015-01-03 DIAGNOSIS — G9341 Metabolic encephalopathy: Secondary | ICD-10-CM | POA: Diagnosis present

## 2015-01-03 DIAGNOSIS — N183 Chronic kidney disease, stage 3 (moderate): Secondary | ICD-10-CM | POA: Insufficient documentation

## 2015-01-03 DIAGNOSIS — I739 Peripheral vascular disease, unspecified: Secondary | ICD-10-CM | POA: Diagnosis present

## 2015-01-03 DIAGNOSIS — K3184 Gastroparesis: Secondary | ICD-10-CM | POA: Diagnosis present

## 2015-01-03 DIAGNOSIS — E1142 Type 2 diabetes mellitus with diabetic polyneuropathy: Secondary | ICD-10-CM | POA: Diagnosis present

## 2015-01-03 DIAGNOSIS — Z85038 Personal history of other malignant neoplasm of large intestine: Secondary | ICD-10-CM

## 2015-01-03 DIAGNOSIS — F0390 Unspecified dementia without behavioral disturbance: Secondary | ICD-10-CM | POA: Diagnosis present

## 2015-01-03 DIAGNOSIS — Z9049 Acquired absence of other specified parts of digestive tract: Secondary | ICD-10-CM | POA: Diagnosis present

## 2015-01-03 DIAGNOSIS — K922 Gastrointestinal hemorrhage, unspecified: Secondary | ICD-10-CM | POA: Diagnosis present

## 2015-01-03 DIAGNOSIS — E1122 Type 2 diabetes mellitus with diabetic chronic kidney disease: Secondary | ICD-10-CM | POA: Diagnosis present

## 2015-01-03 DIAGNOSIS — Z993 Dependence on wheelchair: Secondary | ICD-10-CM

## 2015-01-03 DIAGNOSIS — E11649 Type 2 diabetes mellitus with hypoglycemia without coma: Secondary | ICD-10-CM | POA: Diagnosis present

## 2015-01-03 HISTORY — DX: Chronic kidney disease, unspecified: N18.9

## 2015-01-03 LAB — CBC WITH DIFFERENTIAL/PLATELET
BASOS PCT: 0 %
Basophils Absolute: 0 10*3/uL (ref 0–0.1)
EOS ABS: 0.1 10*3/uL (ref 0–0.7)
Eosinophils Relative: 3 %
HEMATOCRIT: 26.6 % — AB (ref 40.0–52.0)
HEMOGLOBIN: 8.4 g/dL — AB (ref 13.0–18.0)
Lymphocytes Relative: 24 %
Lymphs Abs: 1 10*3/uL (ref 1.0–3.6)
MCH: 28.7 pg (ref 26.0–34.0)
MCHC: 31.4 g/dL — AB (ref 32.0–36.0)
MCV: 91.1 fL (ref 80.0–100.0)
MONO ABS: 0.4 10*3/uL (ref 0.2–1.0)
MONOS PCT: 10 %
Neutro Abs: 2.5 10*3/uL (ref 1.4–6.5)
Neutrophils Relative %: 63 %
Platelets: 159 10*3/uL (ref 150–440)
RBC: 2.92 MIL/uL — AB (ref 4.40–5.90)
RDW: 19.5 % — ABNORMAL HIGH (ref 11.5–14.5)
WBC: 4 10*3/uL (ref 3.8–10.6)

## 2015-01-03 LAB — BASIC METABOLIC PANEL
Anion gap: 4 — ABNORMAL LOW (ref 5–15)
BUN: 40 mg/dL — ABNORMAL HIGH (ref 6–20)
CHLORIDE: 117 mmol/L — AB (ref 101–111)
CO2: 28 mmol/L (ref 22–32)
CREATININE: 2.32 mg/dL — AB (ref 0.61–1.24)
Calcium: 9.5 mg/dL (ref 8.9–10.3)
GFR, EST AFRICAN AMERICAN: 30 mL/min — AB (ref >60)
GFR, EST NON AFRICAN AMERICAN: 25 mL/min — AB (ref >60)
Glucose, Bld: 89 mg/dL (ref 65–99)
POTASSIUM: 5.1 mmol/L (ref 3.5–5.1)
Sodium: 149 mmol/L — ABNORMAL HIGH (ref 135–145)

## 2015-01-03 LAB — URINALYSIS COMPLETE WITH MICROSCOPIC (ARMC ONLY)
Bilirubin Urine: NEGATIVE
Glucose, UA: NEGATIVE mg/dL
HGB URINE DIPSTICK: NEGATIVE
KETONES UR: NEGATIVE mg/dL
NITRITE: NEGATIVE
Protein, ur: >500 mg/dL — AB
SPECIFIC GRAVITY, URINE: 1.012 (ref 1.005–1.030)
Squamous Epithelial / LPF: NONE SEEN
pH: 5 (ref 5.0–8.0)

## 2015-01-03 MED ORDER — CEPHALEXIN 500 MG PO CAPS
500.0000 mg | ORAL_CAPSULE | Freq: Two times a day (BID) | ORAL | Status: DC
Start: 2015-01-03 — End: 2015-01-09

## 2015-01-03 NOTE — Progress Notes (Signed)
  Family: none present Visit Assessment: Chaplain visited with patient and was unable to understand his speech any further than nods yes. He has severe augmented speech. Chaplain offered silent prayers by bedside while he attempted to speak as he will be transported back to the nursing facility  Chaplain and pastoral care can be reached by pager No. [phone] or by submitting an online request, 24x7

## 2015-01-03 NOTE — Discharge Instructions (Signed)
Please have Alan Mcintyre blood level checked in one week. Please seek medical attention for any high fevers, chest pain, shortness of breath, change in behavior, persistent vomiting, bloody stool or any other new or concerning symptoms.  Urinary Tract Infection Urinary tract infections (UTIs) can develop anywhere along your urinary tract. Your urinary tract is your body's drainage system for removing wastes and extra water. Your urinary tract includes two kidneys, two ureters, a bladder, and a urethra. Your kidneys are a pair of bean-shaped organs. Each kidney is about the size of your fist. They are located below your ribs, one on each side of your spine. CAUSES Infections are caused by microbes, which are microscopic organisms, including fungi, viruses, and bacteria. These organisms are so small that they can only be seen through a microscope. Bacteria are the microbes that most commonly cause UTIs. SYMPTOMS  Symptoms of UTIs may vary by age and gender of the patient and by the location of the infection. Symptoms in young women typically include a frequent and intense urge to urinate and a painful, burning feeling in the bladder or urethra during urination. Older women and men are more likely to be tired, shaky, and weak and have muscle aches and abdominal pain. A fever may mean the infection is in your kidneys. Other symptoms of a kidney infection include pain in your back or sides below the ribs, nausea, and vomiting. DIAGNOSIS To diagnose a UTI, your caregiver will ask you about your symptoms. Your caregiver also will ask to provide a urine sample. The urine sample will be tested for bacteria and white blood cells. White blood cells are made by your body to help fight infection. TREATMENT  Typically, UTIs can be treated with medication. Because most UTIs are caused by a bacterial infection, they usually can be treated with the use of antibiotics. The choice of antibiotic and length of treatment  depend on your symptoms and the type of bacteria causing your infection. HOME CARE INSTRUCTIONS  If you were prescribed antibiotics, take them exactly as your caregiver instructs you. Finish the medication even if you feel better after you have only taken some of the medication.  Drink enough water and fluids to keep your urine clear or pale yellow.  Avoid caffeine, tea, and carbonated beverages. They tend to irritate your bladder.  Empty your bladder often. Avoid holding urine for long periods of time.  Empty your bladder before and after sexual intercourse.  After a bowel movement, women should cleanse from front to back. Use each tissue only once. SEEK MEDICAL CARE IF:   You have back pain.  You develop a fever.  Your symptoms do not begin to resolve within 3 days. SEEK IMMEDIATE MEDICAL CARE IF:   You have severe back pain or lower abdominal pain.  You develop chills.  You have nausea or vomiting.  You have continued burning or discomfort with urination. MAKE SURE YOU:   Understand these instructions.  Will watch your condition.  Will get help right away if you are not doing well or get worse. Document Released: 04/11/2005 Document Revised: 01/01/2012 Document Reviewed: 08/10/2011 [facility] Patient Information 2015 [place], [place]. This information is not intended to replace advice given to you by your health care provider. Make sure you discuss any questions you have with your health care provider.

## 2015-01-03 NOTE — ED Notes (Addendum)
Pt was sent from [facility] for evaluation of hallucinations. Per EMS staff states that pt has been hallucinating and thinks he may have a UTI despite no odor to urine or pt c/o any symptoms. Pt denies any burning or pain with urination. Pt states he fell a few days ago and has some pain to his left shoulder and rib area. Pt has a h/o dementia.

## 2015-01-03 NOTE — ED Notes (Signed)
D/c to facility via EMS.  Skin warm  and dry.  NAD.
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
## 2015-01-04 ENCOUNTER — Encounter: Payer: Self-pay | Admitting: Medical Oncology

## 2015-01-04 ENCOUNTER — Emergency Department: Payer: Medicare Other

## 2015-01-04 ENCOUNTER — Inpatient Hospital Stay
Admission: RE | Admit: 2015-01-04 | Discharge: 2015-01-09 | DRG: 871 | Disposition: A | Discharge status: Nursing Home (Includes ICF) | Payer: Medicare Other | Attending: Family Medicine | Admitting: Family Medicine

## 2015-01-04 DIAGNOSIS — Z993 Dependence on wheelchair: Secondary | ICD-10-CM | POA: Diagnosis not present

## 2015-01-04 DIAGNOSIS — I129 Hypertensive chronic kidney disease with stage 1 through stage 4 chronic kidney disease, or unspecified chronic kidney disease: Secondary | ICD-10-CM | POA: Diagnosis present

## 2015-01-04 DIAGNOSIS — R4182 Altered mental status, unspecified: Secondary | ICD-10-CM

## 2015-01-04 DIAGNOSIS — D631 Anemia in chronic kidney disease: Secondary | ICD-10-CM | POA: Diagnosis present

## 2015-01-04 DIAGNOSIS — N390 Urinary tract infection, site not specified: Secondary | ICD-10-CM | POA: Diagnosis present

## 2015-01-04 DIAGNOSIS — A419 Sepsis, unspecified organism: Secondary | ICD-10-CM

## 2015-01-04 DIAGNOSIS — Z85038 Personal history of other malignant neoplasm of large intestine: Secondary | ICD-10-CM | POA: Diagnosis not present

## 2015-01-04 DIAGNOSIS — K922 Gastrointestinal hemorrhage, unspecified: Secondary | ICD-10-CM | POA: Diagnosis present

## 2015-01-04 DIAGNOSIS — N400 Enlarged prostate without lower urinary tract symptoms: Secondary | ICD-10-CM | POA: Diagnosis present

## 2015-01-04 DIAGNOSIS — Z7982 Long term (current) use of aspirin: Secondary | ICD-10-CM | POA: Diagnosis not present

## 2015-01-04 DIAGNOSIS — E1122 Type 2 diabetes mellitus with diabetic chronic kidney disease: Secondary | ICD-10-CM | POA: Diagnosis present

## 2015-01-04 DIAGNOSIS — K219 Gastro-esophageal reflux disease without esophagitis: Secondary | ICD-10-CM | POA: Diagnosis present

## 2015-01-04 DIAGNOSIS — G9341 Metabolic encephalopathy: Secondary | ICD-10-CM | POA: Diagnosis present

## 2015-01-04 DIAGNOSIS — N179 Acute kidney failure, unspecified: Secondary | ICD-10-CM | POA: Diagnosis present

## 2015-01-04 DIAGNOSIS — F0390 Unspecified dementia without behavioral disturbance: Secondary | ICD-10-CM | POA: Diagnosis present

## 2015-01-04 DIAGNOSIS — Z87891 Personal history of nicotine dependence: Secondary | ICD-10-CM | POA: Diagnosis not present

## 2015-01-04 DIAGNOSIS — I739 Peripheral vascular disease, unspecified: Secondary | ICD-10-CM | POA: Diagnosis present

## 2015-01-04 DIAGNOSIS — J449 Chronic obstructive pulmonary disease, unspecified: Secondary | ICD-10-CM | POA: Diagnosis present

## 2015-01-04 DIAGNOSIS — K3184 Gastroparesis: Secondary | ICD-10-CM | POA: Diagnosis present

## 2015-01-04 DIAGNOSIS — E11649 Type 2 diabetes mellitus with hypoglycemia without coma: Secondary | ICD-10-CM | POA: Diagnosis present

## 2015-01-04 DIAGNOSIS — T68XXXA Hypothermia, initial encounter: Secondary | ICD-10-CM

## 2015-01-04 DIAGNOSIS — Z803 Family history of malignant neoplasm of breast: Secondary | ICD-10-CM | POA: Diagnosis not present

## 2015-01-04 DIAGNOSIS — Z794 Long term (current) use of insulin: Secondary | ICD-10-CM | POA: Diagnosis not present

## 2015-01-04 DIAGNOSIS — E870 Hyperosmolality and hypernatremia: Secondary | ICD-10-CM

## 2015-01-04 DIAGNOSIS — E1142 Type 2 diabetes mellitus with diabetic polyneuropathy: Secondary | ICD-10-CM | POA: Diagnosis present

## 2015-01-04 DIAGNOSIS — E1143 Type 2 diabetes mellitus with diabetic autonomic (poly)neuropathy: Secondary | ICD-10-CM | POA: Diagnosis present

## 2015-01-04 DIAGNOSIS — N183 Chronic kidney disease, stage 3 (moderate): Secondary | ICD-10-CM | POA: Diagnosis present

## 2015-01-04 DIAGNOSIS — D696 Thrombocytopenia, unspecified: Secondary | ICD-10-CM | POA: Diagnosis present

## 2015-01-04 DIAGNOSIS — Z9049 Acquired absence of other specified parts of digestive tract: Secondary | ICD-10-CM | POA: Diagnosis present

## 2015-01-04 DIAGNOSIS — E871 Hypo-osmolality and hyponatremia: Secondary | ICD-10-CM | POA: Diagnosis not present

## 2015-01-04 DIAGNOSIS — Z79899 Other long term (current) drug therapy: Secondary | ICD-10-CM | POA: Diagnosis not present

## 2015-01-04 DIAGNOSIS — Z87898 Personal history of other specified conditions: Secondary | ICD-10-CM | POA: Diagnosis not present

## 2015-01-04 LAB — CBC
HCT: 23.1 % — ABNORMAL LOW (ref 40.0–52.0)
Hemoglobin: 7.2 g/dL — ABNORMAL LOW (ref 13.0–18.0)
MCH: 28.6 pg (ref 26.0–34.0)
MCHC: 31 g/dL — ABNORMAL LOW (ref 32.0–36.0)
MCV: 92.3 fL (ref 80.0–100.0)
PLATELETS: 127 10*3/uL — AB (ref 150–440)
RBC: 2.5 MIL/uL — AB (ref 4.40–5.90)
RDW: 19.7 % — AB (ref 11.5–14.5)
WBC: 3.5 10*3/uL — ABNORMAL LOW (ref 3.8–10.6)

## 2015-01-04 LAB — BLOOD GAS, ARTERIAL
ACID-BASE EXCESS: 0.5 mmol/L (ref 0.0–3.0)
Allens test (pass/fail): POSITIVE — AB
BICARBONATE: 28.7 meq/L — AB (ref 21.0–28.0)
FIO2: 0.21 %
O2 Saturation: 92.3 %
Patient temperature: 32.5
pCO2 arterial: 50 mmHg — ABNORMAL HIGH (ref 32.0–48.0)
pH, Arterial: 7.34 — ABNORMAL LOW (ref 7.350–7.450)
pO2, Arterial: 54 mmHg — ABNORMAL LOW (ref 83.0–108.0)

## 2015-01-04 LAB — URINALYSIS COMPLETE WITH MICROSCOPIC (ARMC ONLY)
Bilirubin Urine: NEGATIVE
GLUCOSE, UA: 50 mg/dL — AB
Ketones, ur: NEGATIVE mg/dL
NITRITE: NEGATIVE
Protein, ur: >500 mg/dL — AB
SQUAMOUS EPITHELIAL / LPF: NONE SEEN
Specific Gravity, Urine: 1.015 (ref 1.005–1.030)
pH: 5 (ref 5.0–8.0)

## 2015-01-04 LAB — BASIC METABOLIC PANEL
Anion gap: 8 (ref 5–15)
BUN: 34 mg/dL — ABNORMAL HIGH (ref 6–20)
CALCIUM: 7.9 mg/dL — AB (ref 8.9–10.3)
CO2: 22 mmol/L (ref 22–32)
Chloride: 123 mmol/L — ABNORMAL HIGH (ref 101–111)
Creatinine, Ser: 2.19 mg/dL — ABNORMAL HIGH (ref 0.61–1.24)
GFR, EST AFRICAN AMERICAN: 32 mL/min — AB (ref >60)
GFR, EST NON AFRICAN AMERICAN: 27 mL/min — AB (ref >60)
Glucose, Bld: 58 mg/dL — ABNORMAL LOW (ref 65–99)
Potassium: 4.7 mmol/L (ref 3.5–5.1)
Sodium: 144 mmol/L (ref 135–145)

## 2015-01-04 LAB — CBC WITH DIFFERENTIAL/PLATELET
BASOS ABS: 0 10*3/uL (ref 0–0.1)
Basophils Relative: 0 %
Eosinophils Absolute: 0.1 10*3/uL (ref 0–0.7)
Eosinophils Relative: 1 %
HEMATOCRIT: 25 % — AB (ref 40.0–52.0)
Hemoglobin: 7.6 g/dL — ABNORMAL LOW (ref 13.0–18.0)
LYMPHS PCT: 24 %
Lymphs Abs: 1 10*3/uL (ref 1.0–3.6)
MCH: 28.1 pg (ref 26.0–34.0)
MCHC: 30.5 g/dL — AB (ref 32.0–36.0)
MCV: 91.9 fL (ref 80.0–100.0)
MONOS PCT: 9 %
Monocytes Absolute: 0.4 10*3/uL (ref 0.2–1.0)
Neutro Abs: 2.8 10*3/uL (ref 1.4–6.5)
Neutrophils Relative %: 66 %
Platelets: 135 10*3/uL — ABNORMAL LOW (ref 150–440)
RBC: 2.72 MIL/uL — ABNORMAL LOW (ref 4.40–5.90)
RDW: 19.9 % — ABNORMAL HIGH (ref 11.5–14.5)
WBC: 4.2 10*3/uL (ref 3.8–10.6)

## 2015-01-04 LAB — PROTIME-INR
INR: 1.16
Prothrombin Time: 15 seconds (ref 11.4–15.0)

## 2015-01-04 LAB — LACTIC ACID, PLASMA
Lactic Acid, Venous: 0.7 mmol/L (ref 0.5–2.0)
Lactic Acid, Venous: 0.8 mmol/L (ref 0.5–2.0)

## 2015-01-04 LAB — GLUCOSE, CAPILLARY
GLUCOSE-CAPILLARY: 136 mg/dL — AB (ref 65–99)
GLUCOSE-CAPILLARY: 73 mg/dL (ref 65–99)
Glucose-Capillary: 54 mg/dL — ABNORMAL LOW (ref 65–99)

## 2015-01-04 LAB — COMPREHENSIVE METABOLIC PANEL
ALT: 44 U/L (ref 17–63)
AST: 58 U/L — ABNORMAL HIGH (ref 15–41)
Albumin: 2.9 g/dL — ABNORMAL LOW (ref 3.5–5.0)
Alkaline Phosphatase: 73 U/L (ref 38–126)
Anion gap: 3 — ABNORMAL LOW (ref 5–15)
BILIRUBIN TOTAL: 0.3 mg/dL (ref 0.3–1.2)
BUN: 37 mg/dL — AB (ref 6–20)
CHLORIDE: 116 mmol/L — AB (ref 101–111)
CO2: 27 mmol/L (ref 22–32)
CREATININE: 2.24 mg/dL — AB (ref 0.61–1.24)
Calcium: 8.9 mg/dL (ref 8.9–10.3)
GFR calc Af Amer: 31 mL/min — ABNORMAL LOW (ref >60)
GFR, EST NON AFRICAN AMERICAN: 27 mL/min — AB (ref >60)
Glucose, Bld: 113 mg/dL — ABNORMAL HIGH (ref 65–99)
Potassium: 5.6 mmol/L — ABNORMAL HIGH (ref 3.5–5.1)
Sodium: 146 mmol/L — ABNORMAL HIGH (ref 135–145)
Total Protein: 5.9 g/dL — ABNORMAL LOW (ref 6.5–8.1)

## 2015-01-04 LAB — MRSA PCR SCREENING: MRSA BY PCR: NEGATIVE

## 2015-01-04 LAB — APTT: aPTT: 42 seconds — ABNORMAL HIGH (ref 24–36)

## 2015-01-04 LAB — TROPONIN I
Troponin I: <0.03 ng/mL (ref <0.031)
Troponin I: <0.03 ng/mL (ref <0.031)

## 2015-01-04 LAB — T4, FREE: Free T4: 0.58 ng/dL — ABNORMAL LOW (ref 0.61–1.12)

## 2015-01-04 LAB — TSH: TSH: 8.055 u[IU]/mL — ABNORMAL HIGH (ref 0.350–4.500)

## 2015-01-04 MED ORDER — DEXTROSE-NACL 5-0.2 % IV SOLN
INTRAVENOUS | Status: DC
  Administered 2015-01-04 – 2015-01-05 (×2): via INTRAVENOUS

## 2015-01-04 MED ORDER — SODIUM POLYSTYRENE SULFONATE 15 GM/60ML PO SUSP
45.0000 g | Freq: Once | ORAL | Status: AC
  Administered 2015-01-04: 45 g via RECTAL
  Filled 2015-01-04: qty 180

## 2015-01-04 MED ORDER — DEXTROSE 50 % IV SOLN
INTRAVENOUS | Status: AC
  Administered 2015-01-04: 50 mL via INTRAVENOUS
  Filled 2015-01-04: qty 50

## 2015-01-04 MED ORDER — CETYLPYRIDINIUM CHLORIDE 0.05 % MT LIQD
7.0000 mL | Freq: Two times a day (BID) | OROMUCOSAL | Status: DC
  Administered 2015-01-05 – 2015-01-06 (×4): 7 mL via OROMUCOSAL

## 2015-01-04 MED ORDER — PIPERACILLIN-TAZOBACTAM 3.375 G IVPB
INTRAVENOUS | Status: AC
  Filled 2015-01-04: qty 50

## 2015-01-04 MED ORDER — HEPARIN SODIUM (PORCINE) 5000 UNIT/ML IJ SOLN
5000.0000 [IU] | Freq: Three times a day (TID) | INTRAMUSCULAR | Status: DC
  Administered 2015-01-04 – 2015-01-09 (×14): 5000 [IU] via SUBCUTANEOUS
  Filled 2015-01-04 (×14): qty 1

## 2015-01-04 MED ORDER — CEFTRIAXONE SODIUM IN DEXTROSE 20 MG/ML IV SOLN: 1.0000 g | INTRAVENOUS | Status: DC

## 2015-01-04 MED ORDER — PIPERACILLIN-TAZOBACTAM 3.375 G IVPB
3.3750 g | Freq: Once | INTRAVENOUS | Status: AC
  Administered 2015-01-04: 3.375 g via INTRAVENOUS

## 2015-01-04 MED ORDER — VANCOMYCIN HCL IN DEXTROSE 1-5 GM/200ML-% IV SOLN: 1000.0000 mg | Freq: Once | INTRAVENOUS | Status: AC

## 2015-01-04 MED ORDER — VANCOMYCIN HCL IN DEXTROSE 1-5 GM/200ML-% IV SOLN
INTRAVENOUS | Status: AC
  Filled 2015-01-04: qty 200

## 2015-01-04 MED ORDER — DEXTROSE 50 % IV SOLN
50.0000 mL | INTRAVENOUS | Status: AC
  Administered 2015-01-04: 50 mL via INTRAVENOUS

## 2015-01-04 MED ORDER — PIPERACILLIN-TAZOBACTAM 3.375 G IVPB: 3.3750 g | Freq: Once | INTRAVENOUS | Status: AC

## 2015-01-04 MED ORDER — INSULIN ASPART 100 UNIT/ML ~~LOC~~ SOLN: 0.0000 [IU] | Freq: Three times a day (TID) | SUBCUTANEOUS | Status: DC

## 2015-01-04 MED ORDER — VANCOMYCIN HCL IN DEXTROSE 1-5 GM/200ML-% IV SOLN
1000.0000 mg | Freq: Once | INTRAVENOUS | Status: AC
  Administered 2015-01-04: 1000 mg via INTRAVENOUS

## 2015-01-04 MED ORDER — SODIUM CHLORIDE 0.9 % IV BOLUS (SEPSIS)
1000.0000 mL | Freq: Once | INTRAVENOUS | Status: AC
  Administered 2015-01-04: 1000 mL via INTRAVENOUS

## 2015-01-04 MED ORDER — CHLORHEXIDINE GLUCONATE 0.12 % MT SOLN
15.0000 mL | Freq: Two times a day (BID) | OROMUCOSAL | Status: DC
  Administered 2015-01-04 – 2015-01-09 (×7): 15 mL via OROMUCOSAL

## 2015-01-04 MED ORDER — SODIUM CHLORIDE 0.9 % IV BOLUS (SEPSIS)
1776.0000 mL | Freq: Once | INTRAVENOUS | Status: AC
  Administered 2015-01-04: 1000 mL via INTRAVENOUS

## 2015-01-04 NOTE — ED Notes (Signed)
PT from nursing home with reports that pt was seen here yesterday and diagnosed with UTI, facility was unable to get prescribed antibiotics to treat UTI. They report that pt has continued to be weak.

## 2015-01-04 NOTE — ED Notes (Signed)
Spoke with patient's daughter on the phone.  Daughters name is Jerald Kief.  Her contact number is [phone].  Address [address] [place]  [address].  Daughter is only living relative and wishes to be contacted as needed.  Daughter will try to come to [place] to see father and is currently working toward moving her father up to [place].
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
## 2015-01-05 LAB — URINE CULTURE: CULTURE: NO GROWTH

## 2015-01-05 LAB — BASIC METABOLIC PANEL
Anion gap: 3 — ABNORMAL LOW (ref 5–15)
BUN: 32 mg/dL — ABNORMAL HIGH (ref 6–20)
CALCIUM: 8.4 mg/dL — AB (ref 8.9–10.3)
CO2: 26 mmol/L (ref 22–32)
CREATININE: 2.43 mg/dL — AB (ref 0.61–1.24)
Chloride: 118 mmol/L — ABNORMAL HIGH (ref 101–111)
GFR calc Af Amer: 28 mL/min — ABNORMAL LOW (ref >60)
GFR calc non Af Amer: 24 mL/min — ABNORMAL LOW (ref >60)
Glucose, Bld: 104 mg/dL — ABNORMAL HIGH (ref 65–99)
Potassium: 5 mmol/L (ref 3.5–5.1)
Sodium: 147 mmol/L — ABNORMAL HIGH (ref 135–145)

## 2015-01-05 LAB — URINE DRUG SCREEN, QUALITATIVE (ARMC ONLY)
Amphetamines, Ur Screen: NOT DETECTED
Barbiturates, Ur Screen: NOT DETECTED
Benzodiazepine, Ur Scrn: NOT DETECTED
Cannabinoid 50 Ng, Ur ~~LOC~~: NOT DETECTED
Cocaine Metabolite,Ur ~~LOC~~: NOT DETECTED
MDMA (Ecstasy)Ur Screen: NOT DETECTED
Methadone Scn, Ur: NOT DETECTED
Opiate, Ur Screen: NOT DETECTED
Phencyclidine (PCP) Ur S: NOT DETECTED
Tricyclic, Ur Screen: NOT DETECTED

## 2015-01-05 LAB — GLUCOSE, CAPILLARY
GLUCOSE-CAPILLARY: 116 mg/dL — AB (ref 65–99)
Glucose-Capillary: 134 mg/dL — ABNORMAL HIGH (ref 65–99)
Glucose-Capillary: 52 mg/dL — ABNORMAL LOW (ref 65–99)
Glucose-Capillary: 81 mg/dL (ref 65–99)
Glucose-Capillary: 83 mg/dL (ref 65–99)
Glucose-Capillary: 92 mg/dL (ref 65–99)
Glucose-Capillary: 96 mg/dL (ref 65–99)

## 2015-01-05 LAB — CBC
HCT: 22.7 % — ABNORMAL LOW (ref 40.0–52.0)
Hemoglobin: 7.2 g/dL — ABNORMAL LOW (ref 13.0–18.0)
MCH: 29 pg (ref 26.0–34.0)
MCHC: 31.8 g/dL — ABNORMAL LOW (ref 32.0–36.0)
MCV: 91.4 fL (ref 80.0–100.0)
PLATELETS: 139 10*3/uL — AB (ref 150–440)
RBC: 2.48 MIL/uL — ABNORMAL LOW (ref 4.40–5.90)
RDW: 20.4 % — AB (ref 11.5–14.5)
WBC: 4.5 10*3/uL (ref 3.8–10.6)

## 2015-01-05 LAB — BLOOD GAS, ARTERIAL
Acid-Base Excess: 0.8 mmol/L (ref 0.0–3.0)
Bicarbonate: 26 mEq/L (ref 21.0–28.0)
FIO2: 0.21 %
O2 SAT: 97.8 %
PCO2 ART: 43 mmHg (ref 32.0–48.0)
PH ART: 7.39 (ref 7.350–7.450)
PO2 ART: 101 mmHg (ref 83.0–108.0)
Patient temperature: 37
RATE: 16 resp/min

## 2015-01-05 LAB — TROPONIN I

## 2015-01-05 LAB — PROTEIN / CREATININE RATIO, URINE
Creatinine, Urine: 61 mg/dL
Protein Creatinine Ratio: 3.95 mg/mg{creat} — ABNORMAL HIGH (ref 0.00–0.15)
Total Protein, Urine: 241 mg/dL

## 2015-01-05 LAB — SODIUM, URINE, RANDOM: Sodium, Ur: 86 mmol/L

## 2015-01-05 LAB — HEMOGLOBIN A1C: Hgb A1c MFr Bld: 5.5 % (ref 4.0–6.0)

## 2015-01-05 LAB — AMMONIA: Ammonia: 17 umol/L (ref 9–35)

## 2015-01-05 LAB — TSH: TSH: 5.588 u[IU]/mL — ABNORMAL HIGH (ref 0.350–4.500)

## 2015-01-05 MED ORDER — CEFTRIAXONE SODIUM IN DEXTROSE 20 MG/ML IV SOLN
1.0000 g | INTRAVENOUS | Status: DC
  Administered 2015-01-05 – 2015-01-07 (×3): 1 g via INTRAVENOUS
  Filled 2015-01-05 (×5): qty 50

## 2015-01-05 MED ORDER — DEXTROSE 50 % IV SOLN
INTRAVENOUS | Status: AC
  Administered 2015-01-05: 25 g via INTRAVENOUS
  Filled 2015-01-05: qty 50

## 2015-01-05 MED ORDER — INSULIN ASPART 100 UNIT/ML ~~LOC~~ SOLN
0.0000 [IU] | SUBCUTANEOUS | Status: DC
  Administered 2015-01-06 – 2015-01-07 (×4): 2 [IU] via SUBCUTANEOUS
  Administered 2015-01-07: 3 [IU] via SUBCUTANEOUS
  Administered 2015-01-07: 2 [IU] via SUBCUTANEOUS
  Administered 2015-01-08 (×3): 3 [IU] via SUBCUTANEOUS
  Administered 2015-01-09: 2 [IU] via SUBCUTANEOUS
  Filled 2015-01-05 (×4): qty 2
  Filled 2015-01-05 (×2): qty 3
  Filled 2015-01-05: qty 2
  Filled 2015-01-05 (×2): qty 3
  Filled 2015-01-05: qty 2

## 2015-01-05 MED ORDER — HYDRALAZINE HCL 10 MG PO TABS
10.0000 mg | ORAL_TABLET | Freq: Four times a day (QID) | ORAL | Status: DC
  Administered 2015-01-05 – 2015-01-09 (×15): 10 mg via ORAL
  Filled 2015-01-05 (×21): qty 1

## 2015-01-05 MED ORDER — DEXTROSE 10 % IV SOLN
INTRAVENOUS | Status: DC
  Administered 2015-01-05: 23:00:00 via INTRAVENOUS

## 2015-01-05 MED ORDER — METOPROLOL TARTRATE 50 MG PO TABS
50.0000 mg | ORAL_TABLET | Freq: Two times a day (BID) | ORAL | Status: DC
Start: 2015-01-05 — End: 2015-01-09
  Administered 2015-01-05 – 2015-01-09 (×8): 50 mg via ORAL
  Filled 2015-01-05 (×8): qty 1

## 2015-01-05 MED ORDER — DEXTROSE 10 % IV SOLN
INTRAVENOUS | Status: DC
  Administered 2015-01-05: 04:00:00 via INTRAVENOUS

## 2015-01-05 MED ORDER — LABETALOL HCL 5 MG/ML IV SOLN
10.0000 mg | Freq: Once | INTRAVENOUS | Status: AC
  Administered 2015-01-05: 10 mg via INTRAVENOUS
  Filled 2015-01-05: qty 4

## 2015-01-05 MED ORDER — INSULIN ASPART 100 UNIT/ML ~~LOC~~ SOLN: 0.0000 [IU] | SUBCUTANEOUS | Status: DC

## 2015-01-05 MED ORDER — DEXTROSE 50 % IV SOLN
25.0000 g | Freq: Once | INTRAVENOUS | Status: AC
  Administered 2015-01-05: 25 g via INTRAVENOUS

## 2015-01-05 MED ORDER — AMLODIPINE BESYLATE 10 MG PO TABS
10.0000 mg | ORAL_TABLET | Freq: Every day | ORAL | Status: DC
  Administered 2015-01-05 – 2015-01-09 (×5): 10 mg via ORAL
  Filled 2015-01-05 (×5): qty 1

## 2015-01-05 NOTE — Progress Notes (Addendum)
[name] NAME: Alan Mcintyre    MR#:  [id number]  DATE OF BIRTH:  1935-01-24  SUBJECTIVE:  CHIEF COMPLAINT:   Chief Complaint  Patient presents with  . Weakness  very lethargic and sleepy, not in a position to communicate, sugars were dropping so started on D10, Hb 7.2 REVIEW OF SYSTEMS:  Review of Systems  Unable to perform ROS: dementia   DRUG ALLERGIES:  No Known Allergies VITALS:  Blood pressure 144/83, pulse 84, temperature 97.7 F (36.5 C), temperature source Core (Comment), resp. rate 9, height 6' (1.829 m), weight 92.54 kg (204 lb 0.2 oz), SpO2 99 %. PHYSICAL EXAMINATION:  Physical Exam  Constitutional: He is oriented to person, place, and time and well-developed, well-nourished, and in no distress. He appears lethargic. He has a sickly appearance.  HENT:  Head: Normocephalic and atraumatic.  Eyes: Conjunctivae and EOM are normal. Pupils are equal, round, and reactive to light.  Neck: Normal range of motion. Neck supple. No tracheal deviation present. No thyromegaly present.  Cardiovascular: Normal rate, regular rhythm and normal heart sounds.   Pulmonary/Chest: Effort normal and breath sounds normal. No respiratory distress. He has no wheezes. He exhibits no tenderness.  Abdominal: Soft. Bowel sounds are normal. He exhibits no distension. There is no tenderness.  Musculoskeletal: Normal range of motion.  Neurological: He is oriented to person, place, and time. He appears lethargic. No cranial nerve deficit.  Unable to assess - hard to arouse even with deep painful stimuli (sternal rubs), unsure of his baseline with dementia. Seems non focal though. Moving extremities vountarily.  Skin: Skin is warm and dry. No rash noted.  Psychiatric: Mood and affect normal.  The patient is unresponsive and not responding even to painful stimuli such as sternal rub    LABORATORY PANEL:   CBC Recent Labs Lab  01/05/15 0541  WBC 4.5  HGB 7.2*  HCT 22.7*  PLT 139*   Chemistries  Recent Labs Lab 01/04/15 1456  01/05/15 0541  NA 146*  < > 147*  K 5.6*  < > 5.0  CL 116*  < > 118*  CO2 27  < > 26  GLUCOSE 113*  < > 104*  BUN 37*  < > 32*  CREATININE 2.24*  < > 2.43*  CALCIUM 8.9  < > 8.4*  AST 58*  --   --   ALT 44  --   --   ALKPHOS 73  --   --   BILITOT 0.3  --   --   < > = values in this interval not displayed. RADIOLOGY:  Ct Head Wo Contrast  01/04/2015   CLINICAL DATA:  Altered mental status  EXAM: CT HEAD WITHOUT CONTRAST  TECHNIQUE: Contiguous axial images were obtained from the base of the skull through the vertex without intravenous contrast.  COMPARISON:  October 10, 2013  FINDINGS: There is mild diffuse atrophy. There is no intracranial mass, hemorrhage, extra-axial fluid collection, or midline shift. There is patchy decreased attenuation in the centra semiovale bilaterally. There are prior lacunar type infarcts in the inferior left centrum semiovale. There is evidence of prior small lacunar infarcts in the left lentiform nucleus and anterior limb of the left internal capsule. There are small prior lacunar infarcts in the posterior cerebellar hemispheres bilaterally, stable. There are no new gray-white compartment lesions. No new acute appearing infarct is evident.  There are metallic foreign bodies imbedded in the right parietal bone.  There is artifact from these metallic artifacts. There is no evidence of calvarial fracture or disruption. The mastoid air cells are clear.  IMPRESSION: Atrophy with prior small infarcts and areas of small vessel disease. No intracranial mass, hemorrhage, or acute appearing infarct. Metallic foreign bodies in the right parietal bone, stable.   Electronically Signed   By: Lowella Grip III M.D.   On: 01/04/2015 13:51   Dg Chest Port 1 View  01/04/2015   CLINICAL DATA:  Generalized weakness, hypothermia, UTI, question sepsis, history COPD, diabetes  mellitus, hypertension, chronic kidney disease  EXAM: PORTABLE CHEST - 1 VIEW  COMPARISON:  12/26/2014  FINDINGS: Normal heart size, mediastinal contours and pulmonary vascularity.  Minimal bibasilar atelectasis.  Lungs otherwise clear.  No pleural effusion or pneumothorax.  IMPRESSION: Minimal bibasilar atelectasis.   Electronically Signed   By: Lavonia Dana M.D.   On: 01/04/2015 13:16   ASSESSMENT AND PLAN:   1. Sepsis due to urinary tract infection, get blood as well as urine cultures. continue rocephin IV. Hypothermia is likely due to sepsis. Resolved now with warm blanket 2. Urinary tract infection, getting urine cultures and continued on broad-spectrum antibiotics therapy adjusted antibiotics depending on culture results 3. Acute metabolic encephalopathy:  get ABGs to rule out CO2 retention, the patient on CPAP, will check ammonia also 4. Hypernatremia/hypoglyecemia: monitor sodium & sugar levels on D10 5. Acute on Chronic renal failure. Continue patient on IV fluids. MONITOR kidney function 6. Anemia of chronic disease: hb 7.2, will check stool for hemoccult and anemia panel and get GI c/s  All the records are reviewed and case discussed with Care Management/Social Workerr. Management plans discussed with the patient, family and they are in agreement.  CODE STATUS: Full Code  TOTAL TIME (Critical care) TAKING CARE OF THIS PATIENT: 35 minutes.   More than 50% of the time was spent in counseling/coordination of care: YES  POSSIBLE D/C IN 2-3 DAYS, DEPENDING ON CLINICAL CONDITION. Change as step-down status   [facility], Annelies Coyt M.D on 01/05/2015 at 12:19 PM  Between 7am to 6pm - Pager - [phone]  After 6pm go to [email or web address] - password [email or web address] [facility]  Office  [phone]  CC:  Primary care physician; Lorelee Market, MD
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
## 2015-01-05 NOTE — Care Management Note (Signed)
Case Management Note  Patient Details  Name: Alan Mcintyre MRN: [id number] Date of Birth: Mar 28, 1935  Subjective/Objective:    Pt admitted from [facility] with altered mental status and UTI. CSW aware. Will monitor progress and assist as needed.                 Action/Plan:   Expected Discharge Date:                  Expected Discharge Plan:     In-House Referral:  Clinical Social Work  Discharge planning Services  CM Consult  Post Acute Care Choice:    Choice offered to:     DME Arranged:    DME Agency:     HH Arranged:    [place] Agency:     Status of Service:     Medicare Important Message Given:  Yes Date Medicare IM Given:  01/05/15 Medicare IM give by:  Orvan July Date Additional Medicare IM Given:    Additional Medicare Important Message give by:     If discussed at [place] of Stay Meetings, dates discussed:    Additional Comments:  Jolly Mango, RN 01/05/2015, 3:03 PM
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
## 2015-01-05 NOTE — Clinical Social Work Note (Signed)
Clinical Social Work Assessment  Patient Details  Name: Alan Mcintyre MRN: [id number] Date of Birth: [birth year]  Date of referral:  01/05/15               Reason for consult:  Facility Placement                Permission sought to share information with:    Permission granted to share information::     Name::        Agency::     Relationship::     Contact Information:     Housing/Transportation Living arrangements for the past 2 months:  [place] of Information:  Other (Comment Required), Medical Team (Owner of [facility]: Lavada Mesi) Patient Interpreter Needed:  None Criminal Activity/Legal Involvement Pertinent to Current Situation/Hospitalization:  No - Comment as needed Significant Relationships:   (unknown) Lives with:  Facility Resident Do you feel safe going back to the place where you live?    Need for family participation in patient care:     Care giving concerns:  Patient resides at [facility] owned by Lavada Mesi: [phone]   Social Worker assessment / plan:  Patient was sleeping when CSW attempted to speak with him. CSW contacted owner: Tommie Sams regarding patient and she stated that she would take patient back at discharge and that he has been a resident at her home for 8 years. She expressed no concerns and inquired as to how he was progressing. CSW has placed Fl2 on chart and will continue to follow to monitor patient's progress.   Employment status:    Insurance information:  Medicare PT Recommendations:  Not assessed at this time Information / Referral to community resources:     Patient/Family's Response to care:  Patient sleeping soundly.  Patient/Family's Understanding of and Emotional Response to Diagnosis, Current Treatment, and Prognosis:  Lawanda expressed understanding and appreciation for CSW phone call.  Emotional Assessment Appearance:  Appears stated age Attitude/Demeanor/Rapport:    Affect (typically observed):     Orientation:    Alcohol / Substance use:  Not Applicable Psych involvement (Current and /or in the community):  No (Comment)  Discharge Needs  Concerns to be addressed:  Care Coordination Readmission within the last 30 days:  No Current discharge risk:  None Barriers to Discharge:  No Barriers Identified   [name], LCSW 01/05/2015, 2:57 PM

## 2015-01-05 NOTE — Progress Notes (Signed)
Spoke to MD regarding patient's increased blood pressure.  MD to place order to restart patient's home BP meds.

## 2015-01-05 NOTE — Consult Note (Signed)
Subjective: Patient seen for anemia and occult positive stool. Patient seen and examined chart reviewed. Please see full GI consult by Ms. London.  Patient is a 80 year old male with multiple medical issues including hypertension anemia diabetes chronic kidney disease COPD, referral vascular disease, remote substance abuse, dementia. He was admitted to the hospital for altered mental status and urosepsis. He was found to be Hemoccult-positive and with anemia. There's been no gross evidence of significant ongoing GI bleeding.  Objective: Vital signs in last 24 hours: Temp:  [91.9 F (33.3 C)-98.1 F (36.7 C)] 97.5 F (36.4 C) (06/22 1400) Pulse Rate:  [57-95] 83 (06/22 1400) Resp:  [8-17] 8 (06/22 1400) BP: (115-171)/(62-83) 171/73 mmHg (06/22 1400) SpO2:  [95 %-100 %] 98 % (06/22 1400) Weight:  [92.54 kg (204 lb 0.2 oz)] 92.54 kg (204 lb 0.2 oz) (06/21 1800) Blood pressure 171/73, pulse 83, temperature 97.5 F (36.4 C), temperature source Core (Comment), resp. rate 8, height 6' (1.829 m), weight 92.54 kg (204 lb 0.2 oz), SpO2 98 %.   Intake/Output from previous day: 06/21 0701 - 06/22 0700 In: 3429.7 [I.V.:1165; IV Piggyback:2264.7] Out: 2050 [Urine:2050]  Intake/Output this shift: Total I/O In: 968.8 [I.V.:918.8; IV Piggyback:50] Out: 625 [Urine:625]   General appearance:  Elderly-appearing male normal response to examiner. Resp:  Clear to auscultation Cardio:  Regular rate and rhythm GI:  Soft mildly protuberant bowel sounds are positive there is no apparent and palpation. Extremities:  No clubbing cyanosis or edema  Stool was brown and Hemoccult-positive no gross blood.     Lab Results: Results for orders placed or performed during the hospital encounter of 01/04/15 (from the past 24 hour(s))  MRSA PCR Screening     Status: None   Collection Time: 01/04/15  5:25 PM  Result Value Ref Range   MRSA by PCR NEGATIVE NEGATIVE  Basic metabolic panel     Status: Abnormal   Collection Time: 01/04/15  6:08 PM  Result Value Ref Range   Sodium 144 135 - 145 mmol/L   Potassium 4.7 3.5 - 5.1 mmol/L   Chloride 123 (H) 101 - 111 mmol/L   CO2 22 22 - 32 mmol/L   Glucose, Bld 58 (L) 65 - 99 mg/dL   BUN 34 (H) 6 - 20 mg/dL   Creatinine, Ser 2.19 (H) 0.61 - 1.24 mg/dL   Calcium 7.9 (L) 8.9 - 10.3 mg/dL   GFR calc non Af Amer 27 (L) >60 mL/min   GFR calc Af Amer 32 (L) >60 mL/min   Anion gap 8.0 5 - 15  CBC     Status: Abnormal   Collection Time: 01/04/15  6:08 PM  Result Value Ref Range   WBC 3.5 (L) 3.8 - 10.6 K/uL   RBC 2.50 (L) 4.40 - 5.90 MIL/uL   Hemoglobin 7.2 (L) 13.0 - 18.0 g/dL   HCT 23.1 (L) 40.0 - 52.0 %   MCV 92.3 80.0 - 100.0 fL   MCH 28.6 26.0 - 34.0 pg   MCHC 31.0 (L) 32.0 - 36.0 g/dL   RDW 19.7 (H) 11.5 - 14.5 %   Platelets 127 (L) 150 - 440 K/uL  Troponin I     Status: None   Collection Time: 01/04/15  6:08 PM  Result Value Ref Range   Troponin I <0.03 <0.031 ng/mL  Hemoglobin A1c     Status: None   Collection Time: 01/04/15  6:08 PM  Result Value Ref Range   Hgb A1c MFr Bld 5.5 4.0 - 6.0 %  Glucose, capillary     Status: Abnormal   Collection Time: 01/04/15  7:07 PM  Result Value Ref Range   Glucose-Capillary 54 (L) 65 - 99 mg/dL  Glucose, capillary     Status: Abnormal   Collection Time: 01/04/15  7:49 PM  Result Value Ref Range   Glucose-Capillary 136 (H) 65 - 99 mg/dL  Glucose, capillary     Status: None   Collection Time: 01/04/15 11:35 PM  Result Value Ref Range   Glucose-Capillary 73 65 - 99 mg/dL  Troponin I     Status: None   Collection Time: 01/05/15 12:50 AM  Result Value Ref Range   Troponin I <0.03 <0.031 ng/mL  Glucose, capillary     Status: Abnormal   Collection Time: 01/05/15  3:56 AM  Result Value Ref Range   Glucose-Capillary 52 (L) 65 - 99 mg/dL  Glucose, capillary     Status: Abnormal   Collection Time: 01/05/15  4:58 AM  Result Value Ref Range   Glucose-Capillary 116 (H) 65 - 99 mg/dL  TSH     Status:  Abnormal   Collection Time: 01/05/15  5:41 AM  Result Value Ref Range   TSH 5.588 (H) 0.350 - 4.500 uIU/mL  Troponin I     Status: None   Collection Time: 01/05/15  5:41 AM  Result Value Ref Range   Troponin I <0.03 <0.031 ng/mL  CBC     Status: Abnormal   Collection Time: 01/05/15  5:41 AM  Result Value Ref Range   WBC 4.5 3.8 - 10.6 K/uL   RBC 2.48 (L) 4.40 - 5.90 MIL/uL   Hemoglobin 7.2 (L) 13.0 - 18.0 g/dL   HCT 22.7 (L) 40.0 - 52.0 %   MCV 91.4 80.0 - 100.0 fL   MCH 29.0 26.0 - 34.0 pg   MCHC 31.8 (L) 32.0 - 36.0 g/dL   RDW 20.4 (H) 11.5 - 14.5 %   Platelets 139 (L) 150 - 440 K/uL  Basic metabolic panel     Status: Abnormal   Collection Time: 01/05/15  5:41 AM  Result Value Ref Range   Sodium 147 (H) 135 - 145 mmol/L   Potassium 5.0 3.5 - 5.1 mmol/L   Chloride 118 (H) 101 - 111 mmol/L   CO2 26 22 - 32 mmol/L   Glucose, Bld 104 (H) 65 - 99 mg/dL   BUN 32 (H) 6 - 20 mg/dL   Creatinine, Ser 2.43 (H) 0.61 - 1.24 mg/dL   Calcium 8.4 (L) 8.9 - 10.3 mg/dL   GFR calc non Af Amer 24 (L) >60 mL/min   GFR calc Af Amer 28 (L) >60 mL/min   Anion gap 3 (L) 5 - 15  Glucose, capillary     Status: None   Collection Time: 01/05/15  7:24 AM  Result Value Ref Range   Glucose-Capillary 81 65 - 99 mg/dL  Glucose, capillary     Status: None   Collection Time: 01/05/15 11:21 AM  Result Value Ref Range   Glucose-Capillary 83 65 - 99 mg/dL  Blood gas, arterial     Status: Abnormal   Collection Time: 01/05/15 11:45 AM  Result Value Ref Range   FIO2 0.21 %   Rate 16 resp/min   pH, Arterial 7.39 7.350 - 7.450   pCO2 arterial 43 32.0 - 48.0 mmHg   pO2, Arterial 101 83.0 - 108.0 mmHg   Bicarbonate 26.0 21.0 - 28.0 mEq/L   Acid-Base Excess 0.8 0.0 - 3.0 mmol/L  O2 Saturation 97.8 %   Patient temperature 37.0    Collection site RIGHT RADIAL    Sample type ARTERIAL DRAW    Allens test (pass/fail) YES (A) PASS  Urine Drug Screen, Qualitative ([place] only)     Status: None   Collection  Time: 01/05/15 12:07 PM  Result Value Ref Range   Tricyclic, Ur Screen NONE DETECTED NONE DETECTED   Amphetamines, Ur Screen NONE DETECTED NONE DETECTED   MDMA (Ecstasy)Ur Screen NONE DETECTED NONE DETECTED   Cocaine Metabolite,Ur [place] NONE DETECTED NONE DETECTED   Opiate, Ur Screen NONE DETECTED NONE DETECTED   Phencyclidine (PCP) Ur S NONE DETECTED NONE DETECTED   Cannabinoid 50 Ng, Ur [place] NONE DETECTED NONE DETECTED   Barbiturates, Ur Screen NONE DETECTED NONE DETECTED   Benzodiazepine, Ur Scrn NONE DETECTED NONE DETECTED   Methadone Scn, Ur NONE DETECTED NONE DETECTED  Ammonia     Status: None   Collection Time: 01/05/15 12:52 PM  Result Value Ref Range   Ammonia 17 9 - 35 umol/L  Glucose, capillary     Status: None   Collection Time: 01/05/15  4:11 PM  Result Value Ref Range   Glucose-Capillary 92 65 - 99 mg/dL      Recent Labs  01/04/15 1456 01/04/15 1808 01/05/15 0541  WBC 4.2 3.5* 4.5  HGB 7.6* 7.2* 7.2*  HCT 25.0* 23.1* 22.7*  PLT 135* 127* 139*   BMET  Recent Labs  01/04/15 1456 01/04/15 1808 01/05/15 0541  NA 146* 144 147*  K 5.6* 4.7 5.0  CL 116* 123* 118*  CO2 27 22 26   GLUCOSE 113* 58* 104*  BUN 37* 34* 32*  CREATININE 2.24* 2.19* 2.43*  CALCIUM 8.9 7.9* 8.4*   LFT  Recent Labs  01/04/15 1456  PROT 5.9*  ALBUMIN 2.9*  AST 58*  ALT 44  ALKPHOS 73  BILITOT 0.3   PT/INR  Recent Labs  01/04/15 1239  LABPROT 15.0  INR 1.16   Hepatitis Panel No results for input(s): HEPBSAG, HCVAB, HEPAIGM, HEPBIGM in the last 72 hours. C-Diff No results for input(s): CDIFFTOX in the last 72 hours. No results for input(s): CDIFFPCR in the last 72 hours.   Studies/Results: Ct Head Wo Contrast  01/04/2015   CLINICAL DATA:  Altered mental status  EXAM: CT HEAD WITHOUT CONTRAST  TECHNIQUE: Contiguous axial images were obtained from the base of the skull through the vertex without intravenous contrast.  COMPARISON:  October 10, 2013  FINDINGS: There is mild  diffuse atrophy. There is no intracranial mass, hemorrhage, extra-axial fluid collection, or midline shift. There is patchy decreased attenuation in the centra semiovale bilaterally. There are prior lacunar type infarcts in the inferior left centrum semiovale. There is evidence of prior small lacunar infarcts in the left lentiform nucleus and anterior limb of the left internal capsule. There are small prior lacunar infarcts in the posterior cerebellar hemispheres bilaterally, stable. There are no new gray-white compartment lesions. No new acute appearing infarct is evident.  There are metallic foreign bodies imbedded in the right parietal bone. There is artifact from these metallic artifacts. There is no evidence of calvarial fracture or disruption. The mastoid air cells are clear.  IMPRESSION: Atrophy with prior small infarcts and areas of small vessel disease. No intracranial mass, hemorrhage, or acute appearing infarct. Metallic foreign bodies in the right parietal bone, stable.   Electronically Signed   By: Lowella Grip III M.D.   On: 01/04/2015 13:51   Dg Chest [facility]  1 View  01/04/2015   CLINICAL DATA:  Generalized weakness, hypothermia, UTI, question sepsis, history COPD, diabetes mellitus, hypertension, chronic kidney disease  EXAM: PORTABLE CHEST - 1 VIEW  COMPARISON:  12/26/2014  FINDINGS: Normal heart size, mediastinal contours and pulmonary vascularity.  Minimal bibasilar atelectasis.  Lungs otherwise clear.  No pleural effusion or pneumothorax.  IMPRESSION: Minimal bibasilar atelectasis.   Electronically Signed   By: Lavonia Dana M.D.   On: 01/04/2015 13:16    Scheduled Inpatient Medications:   . antiseptic oral rinse  7 mL Mouth Rinse q12n4p  . cefTRIAXone (ROCEPHIN)  IV  1 g Intravenous Q24H  . chlorhexidine  15 mL Mouth Rinse BID  . heparin  5,000 Units Subcutaneous 3 times per day  . insulin aspart  0-15 Units Subcutaneous 6 times per day    Continuous Inpatient Infusions:   .  dextrose 50 mL/hr at 01/05/15 1421    PRN Inpatient Medications:    Miscellaneous:   Assessment:  1. Anemia. Please see full GI consult. There is a question of whether there is a history of colon cancer either in the patient or family members. There were no family members present for exam he is normochromic and normocytic and with CK D likely some amount of anemia of chronic disease. He is currently otherwise able from a GI bleeding standpoint.  Plan:  1. Please see impression and plan in GI consult agree with following clinically, hemoglobin, transfuse if needed, restarting PPI. Further recommendations as patient's mental status improves.  We'll follow with you  Lollie Sails MD 01/05/2015, 5:24 PM

## 2015-01-05 NOTE — Progress Notes (Signed)
MD paged again regarding patient's elevated BP.  SPB 180s-200.  MD ordered patient's home meds, however something more fast acting is needed.  Orders received for labetalol 10mg  IV once.  Will continue to monitor.

## 2015-01-05 NOTE — Progress Notes (Signed)
Patient's blood glucose in 50s despite have 5% dextrose in IV fluids.  Called MD; given order to start 10% dextrose at 87ml/hr in addition to current fluids.  25g D50 given per protocol.  Will recheck FSBS and continue to monitor.

## 2015-01-06 ENCOUNTER — Inpatient Hospital Stay: Payer: Medicare Other

## 2015-01-06 LAB — VITAMIN D 25 HYDROXY (VIT D DEFICIENCY, FRACTURES): Vit D, 25-Hydroxy: 26.3 ng/mL — ABNORMAL LOW (ref 30.0–100.0)

## 2015-01-06 LAB — BASIC METABOLIC PANEL
ANION GAP: 5 (ref 5–15)
BUN: 26 mg/dL — AB (ref 6–20)
CO2: 25 mmol/L (ref 22–32)
CREATININE: 2.53 mg/dL — AB (ref 0.61–1.24)
Calcium: 9 mg/dL (ref 8.9–10.3)
Chloride: 114 mmol/L — ABNORMAL HIGH (ref 101–111)
GFR, EST AFRICAN AMERICAN: 27 mL/min — AB (ref >60)
GFR, EST NON AFRICAN AMERICAN: 23 mL/min — AB (ref >60)
Glucose, Bld: 109 mg/dL — ABNORMAL HIGH (ref 65–99)
Potassium: 4.6 mmol/L (ref 3.5–5.1)
Sodium: 144 mmol/L (ref 135–145)

## 2015-01-06 LAB — OCCULT BLOOD X 1 CARD TO LAB, STOOL: FECAL OCCULT BLD: POSITIVE — AB

## 2015-01-06 LAB — C4 COMPLEMENT: Complement C4, Body Fluid: 27 mg/dL (ref 14–44)

## 2015-01-06 LAB — PARATHYROID HORMONE, INTACT (NO CA): PTH: 103 pg/mL — AB (ref 15–65)

## 2015-01-06 LAB — CBC
HEMATOCRIT: 25.4 % — AB (ref 40.0–52.0)
Hemoglobin: 7.8 g/dL — ABNORMAL LOW (ref 13.0–18.0)
MCH: 28.5 pg (ref 26.0–34.0)
MCHC: 30.9 g/dL — ABNORMAL LOW (ref 32.0–36.0)
MCV: 92.4 fL (ref 80.0–100.0)
Platelets: 137 10*3/uL — ABNORMAL LOW (ref 150–440)
RBC: 2.75 MIL/uL — AB (ref 4.40–5.90)
RDW: 19.7 % — AB (ref 11.5–14.5)
WBC: 4.8 10*3/uL (ref 3.8–10.6)

## 2015-01-06 LAB — URINE CULTURE: Culture: NO GROWTH

## 2015-01-06 LAB — GLUCOSE, CAPILLARY
GLUCOSE-CAPILLARY: 114 mg/dL — AB (ref 65–99)
Glucose-Capillary: 124 mg/dL — ABNORMAL HIGH (ref 65–99)
Glucose-Capillary: 86 mg/dL (ref 65–99)
Glucose-Capillary: 125 mg/dL — ABNORMAL HIGH (ref 65–99)
Glucose-Capillary: 141 mg/dL — ABNORMAL HIGH (ref 65–99)

## 2015-01-06 LAB — MPO/PR-3 (ANCA) ANTIBODIES: Myeloperoxidase Abs: <9 U/mL (ref 0.0–9.0)

## 2015-01-06 LAB — C3 COMPLEMENT: C3 COMPLEMENT: 88 mg/dL (ref 82–167)

## 2015-01-06 LAB — MAGNESIUM: Magnesium: 1.9 mg/dL (ref 1.7–2.4)

## 2015-01-06 MED ORDER — HYDRALAZINE HCL 20 MG/ML IJ SOLN
10.0000 mg | INTRAMUSCULAR | Status: DC | PRN
  Administered 2015-01-06 (×2): 10 mg via INTRAVENOUS
  Filled 2015-01-06 (×2): qty 1

## 2015-01-06 MED ORDER — LABETALOL HCL 5 MG/ML IV SOLN: 10.0000 mg | INTRAVENOUS | Status: DC | PRN

## 2015-01-06 NOTE — Consult Note (Signed)
Subjective: Patient seen for anemia and hemoccult positive stool.  Mental status is improving and patient could answer some questions.  Patietn states he occasionally gets nauseated, , occasional discomfort in the left abdomen.    Objective: Vital signs in last 24 hours: Temp:  [95.2 F (35.1 C)-98 F (36.7 C)] 98 F (36.7 C) (06/23 1546) Pulse Rate:  [68-93] 72 (06/23 1259) Resp:  [7-19] 15 (06/23 1546) BP: (141-206)/(65-138) 185/85 mmHg (06/23 1546) SpO2:  [98 %-100 %] 100 % (06/23 1546) Blood pressure 185/85, pulse 72, temperature 98 F (36.7 C), temperature source Oral, resp. rate 15, height 6' (1.829 m), weight 92.54 kg (204 lb 0.2 oz), SpO2 100 %.   Intake/Output from previous day: 06/22 0701 - 06/23 0700 In: 2188.8 [I.V.:2138.8; IV Piggyback:50] Out: 2675 [Urine:2675]  Intake/Output this shift: Total I/O In: 463.3 [I.V.:413.3; IV Piggyback:50] Out: 1400 [Urine:1400]   General appearance:  Elderly, no acute distress Resp:  Clear to auscultation Cardio:  rrr GI:  Soft, non-tender, no masses or organomegaly noted.  Extremities:     Lab Results: Results for orders placed or performed during the hospital encounter of 01/04/15 (from the past 24 hour(s))  Mpo/pr-3 (anca) antibodies     Status: None   Collection Time: 01/05/15  6:50 PM  Result Value Ref Range   Myeloperoxidase Abs <9.0 0.0 - 9.0 U/mL   ANCA Proteinase 3 <3.5 0.0 - 3.5 U/mL  C3 complement     Status: None   Collection Time: 01/05/15  6:50 PM  Result Value Ref Range   C3 Complement 88 82 - 167 mg/dL  C4 complement     Status: None   Collection Time: 01/05/15  6:50 PM  Result Value Ref Range   Complement C4, Body Fluid 27 14 - 44 mg/dL  Vit D  25 hydroxy (rtn osteoporosis monitoring)     Status: Abnormal   Collection Time: 01/05/15  6:50 PM  Result Value Ref Range   Vit D, 25-Hydroxy 26.3 (L) 30.0 - 100.0 ng/mL  Parathyroid hormone, intact (no Ca)     Status: Abnormal   Collection Time: 01/05/15   6:50 PM  Result Value Ref Range   PTH 103 (H) 15 - 65 pg/mL  Protein / creatinine ratio, urine     Status: Abnormal   Collection Time: 01/05/15  7:45 PM  Result Value Ref Range   Creatinine, Urine 61 mg/dL   Total Protein, Urine 241 mg/dL   Protein Creatinine Ratio 3.95 (H) 0.00 - 0.15 mg/mg[Cre]  Sodium, urine, random     Status: None   Collection Time: 01/05/15  7:45 PM  Result Value Ref Range   Sodium, Ur 86 mmol/L  Glucose, capillary     Status: None   Collection Time: 01/05/15  7:49 PM  Result Value Ref Range   Glucose-Capillary 96 65 - 99 mg/dL  Glucose, capillary     Status: Abnormal   Collection Time: 01/05/15 11:54 PM  Result Value Ref Range   Glucose-Capillary 134 (H) 65 - 99 mg/dL  Glucose, capillary     Status: Abnormal   Collection Time: 01/06/15  3:57 AM  Result Value Ref Range   Glucose-Capillary 114 (H) 65 - 99 mg/dL  Basic metabolic panel     Status: Abnormal   Collection Time: 01/06/15  5:30 AM  Result Value Ref Range   Sodium 144 135 - 145 mmol/L   Potassium 4.6 3.5 - 5.1 mmol/L   Chloride 114 (H) 101 - 111 mmol/L  CO2 25 22 - 32 mmol/L   Glucose, Bld 109 (H) 65 - 99 mg/dL   BUN 26 (H) 6 - 20 mg/dL   Creatinine, Ser 2.53 (H) 0.61 - 1.24 mg/dL   Calcium 9.0 8.9 - 10.3 mg/dL   GFR calc non Af Amer 23 (L) >60 mL/min   GFR calc Af Amer 27 (L) >60 mL/min   Anion gap 5 5 - 15  Magnesium     Status: None   Collection Time: 01/06/15  5:30 AM  Result Value Ref Range   Magnesium 1.9 1.7 - 2.4 mg/dL  CBC     Status: Abnormal   Collection Time: 01/06/15  5:30 AM  Result Value Ref Range   WBC 4.8 3.8 - 10.6 K/uL   RBC 2.75 (L) 4.40 - 5.90 MIL/uL   Hemoglobin 7.8 (L) 13.0 - 18.0 g/dL   HCT 25.4 (L) 40.0 - 52.0 %   MCV 92.4 80.0 - 100.0 fL   MCH 28.5 26.0 - 34.0 pg   MCHC 30.9 (L) 32.0 - 36.0 g/dL   RDW 19.7 (H) 11.5 - 14.5 %   Platelets 137 (L) 150 - 440 K/uL  Glucose, capillary     Status: Abnormal   Collection Time: 01/06/15  7:23 AM  Result Value Ref  Range   Glucose-Capillary 124 (H) 65 - 99 mg/dL  Glucose, capillary     Status: None   Collection Time: 01/06/15 11:53 AM  Result Value Ref Range   Glucose-Capillary 86 65 - 99 mg/dL  Occult blood card to lab, stool RN will collect     Status: Abnormal   Collection Time: 01/06/15  3:32 PM  Result Value Ref Range   Fecal Occult Bld POSITIVE (A) NEGATIVE      Recent Labs  01/04/15 1808 01/05/15 0541 01/06/15 0530  WBC 3.5* 4.5 4.8  HGB 7.2* 7.2* 7.8*  HCT 23.1* 22.7* 25.4*  PLT 127* 139* 137*   BMET  Recent Labs  01/04/15 1808 01/05/15 0541 01/06/15 0530  NA 144 147* 144  K 4.7 5.0 4.6  CL 123* 118* 114*  CO2 22 26 25   GLUCOSE 58* 104* 109*  BUN 34* 32* 26*  CREATININE 2.19* 2.43* 2.53*  CALCIUM 7.9* 8.4* 9.0   LFT  Recent Labs  01/04/15 1456  PROT 5.9*  ALBUMIN 2.9*  AST 58*  ALT 44  ALKPHOS 73  BILITOT 0.3   PT/INR  Recent Labs  01/04/15 1239  LABPROT 15.0  INR 1.16   Hepatitis Panel No results for input(s): HEPBSAG, HCVAB, HEPAIGM, HEPBIGM in the last 72 hours. C-Diff No results for input(s): CDIFFTOX in the last 72 hours. No results for input(s): CDIFFPCR in the last 72 hours.   Studies/Results: US Renal  01/06/2015   CLINICAL DATA:  Acute renal failure  EXAM: RENAL / URINARY TRACT ULTRASOUND COMPLETE  COMPARISON:  01/10/2014  FINDINGS: Right Kidney:  Length: 10.2 cm. Echogenicity within normal limits. No mass or hydronephrosis visualized.  Left Kidney:  Length: 11.5 cm. Cystic lesion is again noted in the the upper pole of the left kidney. It is stable in appearance from the prior exam.  Bladder:  Decompressed by Foley catheter  IMPRESSION: Stable left upper pole renal cystic lesion. No other focal abnormality is noted.   Electronically Signed   By: Inez Catalina M.D.   On: 01/06/2015 11:49    Scheduled Inpatient Medications:   . amLODipine  10 mg Oral Daily  . antiseptic oral rinse  7 mL  Mouth Rinse q12n4p  . cefTRIAXone (ROCEPHIN)  IV  1  g Intravenous Q24H  . chlorhexidine  15 mL Mouth Rinse BID  . heparin  5,000 Units Subcutaneous 3 times per day  . hydrALAZINE  10 mg Oral 4 times per day  . insulin aspart  0-15 Units Subcutaneous 6 times per day  . metoprolol tartrate  50 mg Oral BID    Continuous Inpatient Infusions:     PRN Inpatient Medications:  hydrALAZINE, labetalol  Miscellaneous:   Assessment:  1) anemia, heme positive stool. History of ACD due to chronic renal disease.  No acute bleeding evident.  Patietn with normal egd and colonoscopy in 2012.   Plan:  1) continue ppi as outpatient.  Will do h pylori serology.  Will follow at a distance. Discussed with Dr Carlynn Spry.   Lollie Sails MD 01/06/2015, 4:13 PM

## 2015-01-06 NOTE — Plan of Care (Signed)
Problem: SLP Dysphagia Goals Goal: Misc Dysphagia Goal Pt will safely tolerate po diet of least restrictive consistency w/ no overt s/s of aspiration noted by Staff/pt/family x3 sessions.    

## 2015-01-06 NOTE — Progress Notes (Signed)
Per Dr. Manuella Ghazi, Pt will go to telemetry floor. Ok to transport without nurse to ultrasound. Ahaana Rochette, RN 11:05 AM

## 2015-01-06 NOTE — Progress Notes (Signed)
Patient's BP remains elevated with SBP in the 170s. MD paged and made aware.  MD to place orders for PRN IV meds for BP.  Will continue to monitor.
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
## 2015-01-06 NOTE — Progress Notes (Signed)
Subjective:  UOP appears improved 2675 cc last 24 hrs Patient's speech is incomprehensible   Objective:  Vital signs in last 24 hours:  Temp:  [95.2 F (35.1 C)-97.9 F (36.6 C)] 96.4 F (35.8 C) (06/23 0700) Pulse Rate:  [68-93] 69 (06/22 2206) Resp:  [7-15] 10 (06/23 0700) BP: (141-206)/(65-138) 152/88 mmHg (06/23 0700) SpO2:  [96 %-100 %] 100 % (06/22 2206)  Weight change:  Filed Weights   01/04/15 1153 01/04/15 1800  Weight: 92.534 kg (204 lb) 92.54 kg (204 lb 0.2 oz)    Intake/Output: I/O last 3 completed shifts: In: 3226.3 [I.V.:3176.3; IV Piggyback:50] Out: 3500 [Urine:3500]     Physical Exam: General: NAD, laying in bed  HEENT Moist mucus membranes  Neck supple  Pulm/lungs Clear ant, lat  CVS/Heart Regular, no rub  Abdomen:  Soft, non tender  Extremities: Trace peripheral edema  Neurologic: Alert, tries to converse but speech is difficult to understand  Skin: No acute rashes  Access:        Basic Metabolic Panel:  Recent Labs Lab 01/03/15 1443 01/04/15 1456 01/04/15 1808 01/05/15 0541  NA 149* 146* 144 147*  K 5.1 5.6* 4.7 5.0  CL 117* 116* 123* 118*  CO2 28 27 22 26   GLUCOSE 89 113* 58* 104*  BUN 40* 37* 34* 32*  CREATININE 2.32* 2.24* 2.19* 2.43*  CALCIUM 9.5 8.9 7.9* 8.4*     CBC:  Recent Labs Lab 01/03/15 1443 01/04/15 1456 01/04/15 1808 01/05/15 0541  WBC 4.0 4.2 3.5* 4.5  NEUTROABS 2.5 2.8  --   --   HGB 8.4* 7.6* 7.2* 7.2*  HCT 26.6* 25.0* 23.1* 22.7*  MCV 91.1 91.9 92.3 91.4  PLT 159 135* 127* 139*      Microbiology: Results for orders placed or performed during the hospital encounter of 01/04/15  Blood Culture (routine x 2)     Status: None (Preliminary result)   Collection Time: 01/04/15 12:40 PM  Result Value Ref Range Status   Specimen Description BLOOD  Final   Special Requests NONE  Final   Culture NO GROWTH < 24 HOURS  Final   Report Status PENDING  Incomplete  Blood Culture (routine x 2)     Status:  None (Preliminary result)   Collection Time: 01/04/15 12:40 PM  Result Value Ref Range Status   Specimen Description BLOOD  Final   Special Requests NONE  Final   Culture NO GROWTH < 24 HOURS  Final   Report Status PENDING  Incomplete  Urine culture     Status: None (Preliminary result)   Collection Time: 01/04/15 12:41 PM  Result Value Ref Range Status   Specimen Description URINE, RANDOM  Final   Special Requests NONE  Final   Culture NO GROWTH < 24 HOURS  Final   Report Status PENDING  Incomplete  MRSA PCR Screening     Status: None   Collection Time: 01/04/15  5:25 PM  Result Value Ref Range Status   MRSA by PCR NEGATIVE NEGATIVE Final    Comment:        The GeneXpert MRSA Assay (FDA approved for NASAL specimens only), is one component of a comprehensive MRSA colonization surveillance program. It is not intended to diagnose MRSA infection nor to guide or monitor treatment for MRSA infections.     Coagulation Studies:  Recent Labs  01/04/15 1239  LABPROT 15.0  INR 1.16    Urinalysis:  Recent Labs  01/03/15 1520 01/04/15 1241  COLORURINE YELLOW* YELLOW*  LABSPEC 1.012 1.015  PHURINE 5.0 5.0  GLUCOSEU NEGATIVE 50*  HGBUR NEGATIVE 2+*  BILIRUBINUR NEGATIVE NEGATIVE  KETONESUR NEGATIVE NEGATIVE  PROTEINUR >500* >500*  NITRITE NEGATIVE NEGATIVE  LEUKOCYTESUR TRACE* TRACE*      Imaging: Ct Head Wo Contrast  01/04/2015   CLINICAL DATA:  Altered mental status  EXAM: CT HEAD WITHOUT CONTRAST  TECHNIQUE: Contiguous axial images were obtained from the base of the skull through the vertex without intravenous contrast.  COMPARISON:  October 10, 2013  FINDINGS: There is mild diffuse atrophy. There is no intracranial mass, hemorrhage, extra-axial fluid collection, or midline shift. There is patchy decreased attenuation in the centra semiovale bilaterally. There are prior lacunar type infarcts in the inferior left centrum semiovale. There is evidence of prior small  lacunar infarcts in the left lentiform nucleus and anterior limb of the left internal capsule. There are small prior lacunar infarcts in the posterior cerebellar hemispheres bilaterally, stable. There are no new gray-white compartment lesions. No new acute appearing infarct is evident.  There are metallic foreign bodies imbedded in the right parietal bone. There is artifact from these metallic artifacts. There is no evidence of calvarial fracture or disruption. The mastoid air cells are clear.  IMPRESSION: Atrophy with prior small infarcts and areas of small vessel disease. No intracranial mass, hemorrhage, or acute appearing infarct. Metallic foreign bodies in the right parietal bone, stable.   Electronically Signed   By: Lowella Grip III M.D.   On: 01/04/2015 13:51   Dg Chest Port 1 View  01/04/2015   CLINICAL DATA:  Generalized weakness, hypothermia, UTI, question sepsis, history COPD, diabetes mellitus, hypertension, chronic kidney disease  EXAM: PORTABLE CHEST - 1 VIEW  COMPARISON:  12/26/2014  FINDINGS: Normal heart size, mediastinal contours and pulmonary vascularity.  Minimal bibasilar atelectasis.  Lungs otherwise clear.  No pleural effusion or pneumothorax.  IMPRESSION: Minimal bibasilar atelectasis.   Electronically Signed   By: Lavonia Dana M.D.   On: 01/04/2015 13:16     Medications:   . dextrose 100 mL/hr at 01/05/15 2315   . amLODipine  10 mg Oral Daily  . antiseptic oral rinse  7 mL Mouth Rinse q12n4p  . cefTRIAXone (ROCEPHIN)  IV  1 g Intravenous Q24H  . chlorhexidine  15 mL Mouth Rinse BID  . heparin  5,000 Units Subcutaneous 3 times per day  . hydrALAZINE  10 mg Oral 4 times per day  . insulin aspart  0-15 Units Subcutaneous 6 times per day  . metoprolol tartrate  50 mg Oral BID   hydrALAZINE, labetalol  Assessment/ Plan:  80 y.o. male with a medical problems of peripheral vascular disease, COPD, chronic kidney disease stage III Baseline creatinine 1.5, diabetes  mellitus, hypertension, anemia of chronic kidney disease, dementia, BPH, history of colon cancer, peripheral neuropathy, gastroparesis, who was admitted to [facility] on 01/04/2015 for evaluation of weakness and altered mental status.   1. Acute renal failure/chronic kidney disease stage III Baseline creatinine 1.5/proteinuria. The patient has had prior episodes of acute renal failure. Creatinine is currently up to 2.43. today's labs are pending. Pending -  SPEP, UPEP, ANA, ANCA ANTIBODIES, GBM ANTIBODIES, C3, C4. There is no urgent indication for dialysis at present. However there is chance for further renal function deterioration which we will need to monitor. Avoid NSAIDs and contrast as possible.  2. Hypernatremia. Serum sodium noted as being 147. Today's labs are pending. Continued on D10 drip @ 100 cc per hour.  3. Anemia of  chronic kidney disease. Hemoglobin currently 7.2 with an MCV of 91. Consider blood transfusion for hemoglobin of 7 or less. Defer to hospitalist.   LOS: 2 Cherita Hebel 6/23/20167:34 AM

## 2015-01-06 NOTE — Progress Notes (Signed)
[place] at [place] NAME: Alan Mcintyre    MR#:  [id number]  DATE OF BIRTH:  28-Nov-1934  SUBJECTIVE:  CHIEF COMPLAINT:   Chief Complaint  Patient presents with  . Weakness  more awake than y'day but still sleepy at times. Hypothermia continues to be an issue - intermittently REVIEW OF SYSTEMS:  Review of Systems  Unable to perform ROS: dementia   DRUG ALLERGIES:  No Known Allergies VITALS:  Blood pressure 185/95, pulse 72, temperature 96.8 F (36 C), temperature source Core (Comment), resp. rate 19, height 6' (1.829 m), weight 92.54 kg (204 lb 0.2 oz), SpO2 100 %. PHYSICAL EXAMINATION:  Physical Exam  Constitutional: He is well-developed, well-nourished, and in no distress. He appears lethargic. He appears unhealthy. He has a sickly appearance.  HENT:  Head: Normocephalic and atraumatic.  Eyes: Conjunctivae and EOM are normal. Pupils are equal, round, and reactive to light.  Neck: Normal range of motion. Neck supple. No tracheal deviation present. No thyromegaly present.  Cardiovascular: Normal rate, regular rhythm and normal heart sounds.   Pulmonary/Chest: Effort normal and breath sounds normal. No respiratory distress. He has no wheezes. He exhibits no tenderness.  Abdominal: Soft. Bowel sounds are normal. He exhibits no distension. There is no tenderness.  Musculoskeletal: Normal range of motion.  Neurological: He appears lethargic. No cranial nerve deficit.  Unable to assess well - more awake but still lethargic. Seems non focal though. Moving extremities vountarily.  Skin: Skin is warm and dry. No rash noted.  Psychiatric: Mood and affect normal.  Unable to assess well - more awake but still lethargic. Seems non focal though. Moving extremities vountarily.   LABORATORY PANEL:   CBC  Recent Labs Lab 01/06/15 0530  WBC 4.8  HGB 7.8*  HCT 25.4*  PLT 137*   Chemistries  Recent Labs Lab 01/04/15 1456   01/06/15 0530  NA 146*  < > 144  K 5.6*  < > 4.6  CL 116*  < > 114*  CO2 27  < > 25  GLUCOSE 113*  < > 109*  BUN 37*  < > 26*  CREATININE 2.24*  < > 2.53*  CALCIUM 8.9  < > 9.0  MG  --   --  1.9  AST 58*  --   --   ALT 44  --   --   ALKPHOS 73  --   --   BILITOT 0.3  --   --   < > = values in this interval not displayed. RADIOLOGY:  US Renal  01/06/2015   CLINICAL DATA:  Acute renal failure  EXAM: RENAL / URINARY TRACT ULTRASOUND COMPLETE  COMPARISON:  01/10/2014  FINDINGS: Right Kidney:  Length: 10.2 cm. Echogenicity within normal limits. No mass or hydronephrosis visualized.  Left Kidney:  Length: 11.5 cm. Cystic lesion is again noted in the the upper pole of the left kidney. It is stable in appearance from the prior exam.  Bladder:  Decompressed by Foley catheter  IMPRESSION: Stable left upper pole renal cystic lesion. No other focal abnormality is noted.   Electronically Signed   By: Inez Catalina M.D.   On: 01/06/2015 11:49   ASSESSMENT AND PLAN:   1. Sepsis due to urinary tract infection, neg blood as well as urine cultures. continue rocephin IV. Hypothermia is likely due to sepsis. Resolved now with warm blanket - this continues to be an issue (intermittent hypothermia) 2. Urinary tract infection, getting urine  cultures and continued on broad-spectrum antibiotics therapy adjusted antibiotics depending on culture results 3. Acute metabolic encephalopathy: ammonia 17, improving. Will get ST eval to start diet 4. Hypernatremia/hypoglyecemia: resolved.  5. Acute on Chronic renal failure: it's worsening although he's making good amount of urine. MONITOR kidney function, appreciate nephro c/s,  6. Anemia of chronic disease: hb 7.8 (7.2), will check stool for hemoccult and anemia panel. appreciate GI c/s - conservative mgmt per them.  All the records are reviewed and case discussed with Care Management/Social Workerr. Management plans discussed with the patient, family and they are in  agreement.  CODE STATUS: Full Code  TOTAL TIME (Critical care) TAKING CARE OF THIS PATIENT: 35 minutes.   More than 50% of the time was spent in counseling/coordination of care: YES  POSSIBLE D/C IN 1-2 DAYS, DEPENDING ON CLINICAL CONDITION. Can transfer to tele today.   [facility], Enolia Koepke M.D on 01/06/2015 at 1:11 PM  Between 7am to 6pm - Pager - [phone]  After 6pm go to [email or web address] - password [email or web address] [facility]  Office  [phone]  CC:  Primary care physician; Lorelee Market, MD

## 2015-01-07 LAB — PROTEIN ELECTROPHORESIS, SERUM
A/G Ratio: 0.9 (ref 0.7–1.7)
Albumin ELP: 2.6 g/dL — ABNORMAL LOW (ref 2.9–4.4)
Alpha-1-Globulin: 0.2 g/dL (ref 0.0–0.4)
Alpha-2-Globulin: 0.5 g/dL (ref 0.4–1.0)
Beta Globulin: 0.9 g/dL (ref 0.7–1.3)
GLOBULIN, TOTAL: 2.8 g/dL (ref 2.2–3.9)
Gamma Globulin: 1.1 g/dL (ref 0.4–1.8)
TOTAL PROTEIN ELP: 5.4 g/dL — AB (ref 6.0–8.5)

## 2015-01-07 LAB — BASIC METABOLIC PANEL
Anion gap: 6 (ref 5–15)
BUN: 27 mg/dL — ABNORMAL HIGH (ref 6–20)
CO2: 26 mmol/L (ref 22–32)
Calcium: 9.3 mg/dL (ref 8.9–10.3)
Chloride: 111 mmol/L (ref 101–111)
Creatinine, Ser: 2.65 mg/dL — ABNORMAL HIGH (ref 0.61–1.24)
GFR calc Af Amer: 25 mL/min — ABNORMAL LOW (ref >60)
GFR calc non Af Amer: 22 mL/min — ABNORMAL LOW (ref >60)
Glucose, Bld: 84 mg/dL (ref 65–99)
Potassium: 5 mmol/L (ref 3.5–5.1)
Sodium: 143 mmol/L (ref 135–145)

## 2015-01-07 LAB — GLUCOSE, CAPILLARY
GLUCOSE-CAPILLARY: 127 mg/dL — AB (ref 65–99)
GLUCOSE-CAPILLARY: 158 mg/dL — AB (ref 65–99)
Glucose-Capillary: 85 mg/dL (ref 65–99)
Glucose-Capillary: 94 mg/dL (ref 65–99)
Glucose-Capillary: 132 mg/dL — ABNORMAL HIGH (ref 65–99)
Glucose-Capillary: 117 mg/dL — ABNORMAL HIGH (ref 65–99)

## 2015-01-07 LAB — GLOMERULAR BASEMENT MEMBRANE ANTIBODIES: GBM Ab: 7 units (ref 0–20)

## 2015-01-07 MED ORDER — DUTASTERIDE 0.5 MG PO CAPS
0.5000 mg | ORAL_CAPSULE | Freq: Every day | ORAL | Status: DC
  Administered 2015-01-07 – 2015-01-09 (×3): 0.5 mg via ORAL
  Filled 2015-01-07 (×3): qty 1

## 2015-01-07 MED ORDER — ACETAMINOPHEN 325 MG PO TABS
650.0000 mg | ORAL_TABLET | Freq: Four times a day (QID) | ORAL | Status: DC | PRN
  Administered 2015-01-07: 650 mg via ORAL
  Filled 2015-01-07: qty 2

## 2015-01-07 MED ORDER — ASPIRIN EC 81 MG PO TBEC
81.0000 mg | DELAYED_RELEASE_TABLET | Freq: Every day | ORAL | Status: DC
  Administered 2015-01-07 – 2015-01-09 (×3): 81 mg via ORAL
  Filled 2015-01-07 (×3): qty 1

## 2015-01-07 MED ORDER — PANTOPRAZOLE SODIUM 40 MG PO TBEC
40.0000 mg | DELAYED_RELEASE_TABLET | Freq: Two times a day (BID) | ORAL | Status: DC
  Administered 2015-01-07 – 2015-01-09 (×5): 40 mg via ORAL
  Filled 2015-01-07 (×5): qty 1

## 2015-01-07 MED ORDER — ADULT MULTIVITAMIN W/MINERALS CH
1.0000 | ORAL_TABLET | Freq: Every day | ORAL | Status: DC
  Administered 2015-01-07 – 2015-01-08 (×2): 1 via ORAL
  Filled 2015-01-07 (×2): qty 1

## 2015-01-07 MED ORDER — SODIUM CHLORIDE 0.9 % IJ SOLN
3.0000 mL | Freq: Two times a day (BID) | INTRAMUSCULAR | Status: DC
  Administered 2015-01-07 – 2015-01-08 (×4): 3 mL via INTRAVENOUS

## 2015-01-07 MED ORDER — TAMSULOSIN HCL 0.4 MG PO CAPS
0.4000 mg | ORAL_CAPSULE | Freq: Every day | ORAL | Status: DC
  Administered 2015-01-07 – 2015-01-08 (×2): 0.4 mg via ORAL
  Filled 2015-01-07 (×2): qty 1

## 2015-01-07 MED ORDER — SODIUM CHLORIDE 0.9 % IJ SOLN: 3.0000 mL | INTRAMUSCULAR | Status: DC | PRN

## 2015-01-07 NOTE — Progress Notes (Signed)
[place] at [place] NAME: Alan Mcintyre    MR#:  [id number]  DATE OF BIRTH:  1934/08/04  SUBJECTIVE:  CHIEF COMPLAINT:   Chief Complaint  Patient presents with  . Weakness  more awake than y'day but still sleepy at times. Creat continues to get worse. Had food spilled all over him when I saw him REVIEW OF SYSTEMS:  Review of Systems  Unable to perform ROS: dementia   DRUG ALLERGIES:  No Known Allergies VITALS:  Blood pressure 151/99, pulse 64, temperature 97.7 F (36.5 C), temperature source Oral, resp. rate 16, height 6' (1.829 m), weight 83.598 kg (184 lb 4.8 oz), SpO2 100 %. PHYSICAL EXAMINATION:  Physical Exam  Constitutional: He is well-developed, well-nourished, and in no distress. He appears lethargic. He appears unhealthy. He has a sickly appearance.  HENT:  Head: Normocephalic and atraumatic.  Eyes: Conjunctivae and EOM are normal. Pupils are equal, round, and reactive to light.  Neck: Normal range of motion. Neck supple. No tracheal deviation present. No thyromegaly present.  Cardiovascular: Normal rate, regular rhythm and normal heart sounds.   Pulmonary/Chest: Effort normal and breath sounds normal. No respiratory distress. He has no wheezes. He exhibits no tenderness.  Abdominal: Soft. Bowel sounds are normal. He exhibits no distension. There is no tenderness.  Musculoskeletal: Normal range of motion.  Neurological: He appears lethargic. No cranial nerve deficit.  Unable to assess well - more awake but still lethargic. Seems non focal though. Moving extremities vountarily.  Skin: Skin is warm and dry. No rash noted.  Psychiatric: Mood and affect normal.  Unable to assess well - more awake but still lethargic. Seems non focal though. Moving extremities vountarily.   LABORATORY PANEL:   CBC  Recent Labs Lab 01/06/15 0530  WBC 4.8  HGB 7.8*  HCT 25.4*  PLT 137*   Chemistries  Recent Labs Lab  01/04/15 1456  01/06/15 0530 01/07/15 0411  NA 146*  < > 144 143  K 5.6*  < > 4.6 5.0  CL 116*  < > 114* 111  CO2 27  < > 25 26  GLUCOSE 113*  < > 109* 84  BUN 37*  < > 26* 27*  CREATININE 2.24*  < > 2.53* 2.65*  CALCIUM 8.9  < > 9.0 9.3  MG  --   --  1.9  --   AST 58*  --   --   --   ALT 44  --   --   --   ALKPHOS 73  --   --   --   BILITOT 0.3  --   --   --   < > = values in this interval not displayed. RADIOLOGY:  No results found. ASSESSMENT AND PLAN:   1. Sepsis due to urinary tract infection, neg blood as well as urine cultures. continue rocephin IV. Hypothermia is likely due to sepsis. Resolved now with warm blanket  2. Urinary tract infection, getting urine cultures and continued on broad-spectrum antibiotics therapy adjusted antibiotics depending on culture results 3. Acute metabolic encephalopathy: ammonia 17, improving. Will get ST eval to start diet 4. Acute on Chronic renal failure: it's worsening although he's making good amount of urine (2 liters in last 24 hrs). MONITOR kidney function, nephro following. Creat 2.2->2.53->2.65.  5. Hypernatremia/hypoglyecemia: resolved.  6. Anemia of chronic kidney disease: hb 7.8 (7.2)->7.8, Appreciate GI c/s - conservative mgmt per them.  All the records are reviewed and case  discussed with Care Management/Social Worker. Management plans discussed with the patient, family and they are in agreement.  CODE STATUS: Full Code  TOTAL TIME TAKING CARE OF THIS PATIENT: 35 minutes.   More than 50% of the time was spent in counseling/coordination of care: YES  POSSIBLE D/C IN 1-2 DAYS, DEPENDING ON CLINICAL CONDITION. Can transfer to tele today.  Likely D/C back to family care home if kidney function stabilizes. At baseline he's wheelchair bound.  [facility], Jaira Canady M.D on 01/07/2015 at 4:17 PM  Between 7am to 6pm - Pager - [phone]  After 6pm go to [email or web address] - password [email or web address] [place] [facility]  Office   [phone]  CC:  Primary care physician; Alan Market, MD

## 2015-01-07 NOTE — Progress Notes (Signed)
Alert but confused x3. Incontinent void following foley removal this morning. No complaints.  Alan Mcintyre

## 2015-01-07 NOTE — Progress Notes (Signed)
Notified physician of patient's complaint of leg pain, tylenol order placed.

## 2015-01-07 NOTE — Clinical Social Work Note (Signed)
CSW notified that pt transferred from CCU.  FL2 on chart for MD signature.  CSW will follow to DC.

## 2015-01-07 NOTE — Care Management (Signed)
Spoke with Nunzio Cory from the family care home.  Until one month ago, patient was alert and oriented and appropriate. The patient  Started declining mentally- seeing things and talking out of his head.  He is wheelchair bound. Does not ambulate.  Patient may return to the facility and agreeable to home health.  Agency preference is [facility].  Will anticipate home health nursing at discharge.

## 2015-01-07 NOTE — Progress Notes (Signed)
   01/07/15 1000  Clinical Encounter Type  Visited With Patient  Visit Type Initial  Consult/Referral To Chaplain  Spiritual Encounters  Spiritual Needs Emotional;Other (Comment)  Chaplain was walking through the unit and noticed that patient needed assistance. Assisted patient and contacted nurse to assist patient. Chaplain Latosha Gaylord A. Laruen Risser Ext. [phone]

## 2015-01-07 NOTE — Progress Notes (Signed)
Subjective:  atient appears to be more awake and alert at present. Urine output was 4.6 L over the past 24 hours. Creatinine still high at 2.65 however.   Objective:  Vital signs in last 24 hours:  Temp:  [96.4 F (35.8 C)-98.4 F (36.9 C)] 97.7 F (36.5 C) (06/24 1143) Pulse Rate:  [64-76] 64 (06/24 1237) Resp:  [14-20] 16 (06/24 1143) BP: (151-185)/(65-99) 151/99 mmHg (06/24 1237) SpO2:  [100 %] 100 % (06/24 1143) Weight:  [83.598 kg (184 lb 4.8 oz)] 83.598 kg (184 lb 4.8 oz) (06/24 0356)  Weight change:  Filed Weights   01/04/15 1153 01/04/15 1800 01/07/15 0356  Weight: 92.534 kg (204 lb) 92.54 kg (204 lb 0.2 oz) 83.598 kg (184 lb 4.8 oz)    Intake/Output: I/O last 3 completed shifts: In: 1690.8 [P.O.:240; I.V.:1400.8; IV Piggyback:50] Out: [id number] [Urine:4600; Stool:1]     Physical Exam: General: NAD, laying in bed  HEENT Moist mucus membranes  Neck supple  Pulm/lungs Clear bilatearl, normal effort  CVS/Heart Regular, no rub  Abdomen:  Soft, non tender, BS present  Extremities: Trace peripheral edema  Neurologic: Awake, alert, follows simple commands  Skin: No acute rashes  Access:        Basic Metabolic Panel:  Recent Labs Lab 01/04/15 1456 01/04/15 1808 01/05/15 0541 01/06/15 0530 01/07/15 0411  NA 146* 144 147* 144 143  K 5.6* 4.7 5.0 4.6 5.0  CL 116* 123* 118* 114* 111  CO2 27 22 26 25 26   GLUCOSE 113* 58* 104* 109* 84  BUN 37* 34* 32* 26* 27*  CREATININE 2.24* 2.19* 2.43* 2.53* 2.65*  CALCIUM 8.9 7.9* 8.4* 9.0 9.3  MG  --   --   --  1.9  --      CBC:  Recent Labs Lab 01/03/15 1443 01/04/15 1456 01/04/15 1808 01/05/15 0541 01/06/15 0530  WBC 4.0 4.2 3.5* 4.5 4.8  NEUTROABS 2.5 2.8  --   --   --   HGB 8.4* 7.6* 7.2* 7.2* 7.8*  HCT 26.6* 25.0* 23.1* 22.7* 25.4*  MCV 91.1 91.9 92.3 91.4 92.4  PLT 159 135* 127* 139* 137*      Microbiology: Results for orders placed or performed during the hospital encounter of 01/04/15  Blood  Culture (routine x 2)     Status: None (Preliminary result)   Collection Time: 01/04/15 12:40 PM  Result Value Ref Range Status   Specimen Description BLOOD  Final   Special Requests NONE  Final   Culture NO GROWTH < 24 HOURS  Final   Report Status PENDING  Incomplete  Blood Culture (routine x 2)     Status: None (Preliminary result)   Collection Time: 01/04/15 12:40 PM  Result Value Ref Range Status   Specimen Description BLOOD  Final   Special Requests NONE  Final   Culture NO GROWTH < 24 HOURS  Final   Report Status PENDING  Incomplete  Urine culture     Status: None   Collection Time: 01/04/15 12:41 PM  Result Value Ref Range Status   Specimen Description URINE, RANDOM  Final   Special Requests NONE  Final   Culture NO GROWTH  Final   Report Status 01/06/2015 FINAL  Final  MRSA PCR Screening     Status: None   Collection Time: 01/04/15  5:25 PM  Result Value Ref Range Status   MRSA by PCR NEGATIVE NEGATIVE Final    Comment:        The GeneXpert MRSA  Assay (FDA approved for NASAL specimens only), is one component of a comprehensive MRSA colonization surveillance program. It is not intended to diagnose MRSA infection nor to guide or monitor treatment for MRSA infections.     Coagulation Studies: No results for input(s): LABPROT, INR in the last 72 hours.  Urinalysis: No results for input(s): COLORURINE, LABSPEC, PHURINE, GLUCOSEU, HGBUR, BILIRUBINUR, KETONESUR, PROTEINUR, UROBILINOGEN, NITRITE, LEUKOCYTESUR in the last 72 hours.  Invalid input(s): APPERANCEUR    Imaging: US Renal  01/06/2015   CLINICAL DATA:  Acute renal failure  EXAM: RENAL / URINARY TRACT ULTRASOUND COMPLETE  COMPARISON:  01/10/2014  FINDINGS: Right Kidney:  Length: 10.2 cm. Echogenicity within normal limits. No mass or hydronephrosis visualized.  Left Kidney:  Length: 11.5 cm. Cystic lesion is again noted in the the upper pole of the left kidney. It is stable in appearance from the prior exam.   Bladder:  Decompressed by Foley catheter  IMPRESSION: Stable left upper pole renal cystic lesion. No other focal abnormality is noted.   Electronically Signed   By: Inez Catalina M.D.   On: 01/06/2015 11:49     Medications:     . amLODipine  10 mg Oral Daily  . antiseptic oral rinse  7 mL Mouth Rinse q12n4p  . cefTRIAXone (ROCEPHIN)  IV  1 g Intravenous Q24H  . chlorhexidine  15 mL Mouth Rinse BID  . heparin  5,000 Units Subcutaneous 3 times per day  . hydrALAZINE  10 mg Oral 4 times per day  . insulin aspart  0-15 Units Subcutaneous 6 times per day  . metoprolol tartrate  50 mg Oral BID  . sodium chloride  3 mL Intravenous Q12H   hydrALAZINE, labetalol, sodium chloride  Assessment/ Plan:  80 y.o. male with a medical problems of peripheral vascular disease, COPD, chronic kidney disease stage III Baseline creatinine 1.5, diabetes mellitus, hypertension, anemia of chronic kidney disease, dementia, BPH, history of colon cancer, peripheral neuropathy, gastroparesis, who was admitted to [facility] on 01/04/2015 for evaluation of weakness and altered mental status.   1. Acute renal failure/chronic kidney disease stage III Baseline creatinine 1.5/proteinuria. The patient has had prior episodes of acute renal failure.  Good UOP noted, however Cr still trending up, suspect this will begin to improve.  Avoid nephrotoxins as possible at this time. ANCA  2. Hypernatremia. Na down to 143, off dextrose drip now.  3. Anemia of chronic kidney disease. No new hgb today, last hgb 7.2, consider transfusion if hgb continues to drift down.   LOS: 3 Zed Wanninger 6/24/20161:18 PM

## 2015-01-07 NOTE — Consult Note (Signed)
Subjective: Patient seen for anemia, heme positive stool.  Hemodynamically stable, no evidence of overt GI bleeding. No emesis, one bm not recorded as bloody.  Objective: Vital signs in last 24 hours: Temp:  [97.7 F (36.5 C)-98.4 F (36.9 C)] 97.7 F (36.5 C) (06/24 1143) Pulse Rate:  [64-76] 64 (06/24 1237) Resp:  [16-20] 16 (06/24 1143) BP: (151-177)/(65-99) 151/99 mmHg (06/24 1237) SpO2:  [100 %] 100 % (06/24 1143) Weight:  [83.598 kg (184 lb 4.8 oz)] 83.598 kg (184 lb 4.8 oz) (06/24 0356) Blood pressure 151/99, pulse 64, temperature 97.7 F (36.5 C), temperature source Oral, resp. rate 16, height 6' (1.829 m), weight 83.598 kg (184 lb 4.8 oz), SpO2 100 %.   Intake/Output from previous day: 06/23 0701 - 06/24 0700 In: 703.3 [P.O.:240; I.V.:413.3; IV Piggyback:50] Out: 3301 [Urine:3300; Stool:1]  Intake/Output this shift: Total I/O In: 170 [P.O.:120; IV Piggyback:50] Out: 500 [Urine:500]   General appearance:  Elderly, nad Resp:  Occasional coarse wheeze, otherwise cta Cardio:  rrr GI:  Soft, non-distended bowel sounds positive not tender.  Extremities:     Lab Results: Results for orders placed or performed during the hospital encounter of 01/04/15 (from the past 24 hour(s))  Glucose, capillary     Status: Abnormal   Collection Time: 01/06/15 10:09 PM  Result Value Ref Range   Glucose-Capillary 141 (H) 65 - 99 mg/dL   Comment 1 Notify RN   Glucose, capillary     Status: Abnormal   Collection Time: 01/06/15 11:46 PM  Result Value Ref Range   Glucose-Capillary 127 (H) 65 - 99 mg/dL   Comment 1 Notify RN   Glucose, capillary     Status: None   Collection Time: 01/07/15  4:00 AM  Result Value Ref Range   Glucose-Capillary 85 65 - 99 mg/dL   Comment 1 Notify RN   Basic metabolic panel     Status: Abnormal   Collection Time: 01/07/15  4:11 AM  Result Value Ref Range   Sodium 143 135 - 145 mmol/L   Potassium 5.0 3.5 - 5.1 mmol/L   Chloride 111 101 - 111 mmol/L   CO2 26 22 - 32 mmol/L   Glucose, Bld 84 65 - 99 mg/dL   BUN 27 (H) 6 - 20 mg/dL   Creatinine, Ser 2.65 (H) 0.61 - 1.24 mg/dL   Calcium 9.3 8.9 - 10.3 mg/dL   GFR calc non Af Amer 22 (L) >60 mL/min   GFR calc Af Amer 25 (L) >60 mL/min   Anion gap 6 5 - 15  Glucose, capillary     Status: None   Collection Time: 01/07/15  7:28 AM  Result Value Ref Range   Glucose-Capillary 94 65 - 99 mg/dL   Comment 1 Notify RN   Glucose, capillary     Status: Abnormal   Collection Time: 01/07/15 11:56 AM  Result Value Ref Range   Glucose-Capillary 132 (H) 65 - 99 mg/dL      Recent Labs  01/04/15 1808 01/05/15 0541 01/06/15 0530  WBC 3.5* 4.5 4.8  HGB 7.2* 7.2* 7.8*  HCT 23.1* 22.7* 25.4*  PLT 127* 139* 137*   BMET  Recent Labs  01/05/15 0541 01/06/15 0530 01/07/15 0411  NA 147* 144 143  K 5.0 4.6 5.0  CL 118* 114* 111  CO2 26 25 26   GLUCOSE 104* 109* 84  BUN 32* 26* 27*  CREATININE 2.43* 2.53* 2.65*  CALCIUM 8.4* 9.0 9.3   LFT No results for input(s): PROT,  ALBUMIN, AST, ALT, ALKPHOS, BILITOT, BILIDIR, IBILI in the last 72 hours. PT/INR No results for input(s): LABPROT, INR in the last 72 hours. Hepatitis Panel No results for input(s): HEPBSAG, HCVAB, HEPAIGM, HEPBIGM in the last 72 hours. C-Diff No results for input(s): CDIFFTOX in the last 72 hours. No results for input(s): CDIFFPCR in the last 72 hours.   Studies/Results: US Renal  01/06/2015   CLINICAL DATA:  Acute renal failure  EXAM: RENAL / URINARY TRACT ULTRASOUND COMPLETE  COMPARISON:  01/10/2014  FINDINGS: Right Kidney:  Length: 10.2 cm. Echogenicity within normal limits. No mass or hydronephrosis visualized.  Left Kidney:  Length: 11.5 cm. Cystic lesion is again noted in the the upper pole of the left kidney. It is stable in appearance from the prior exam.  Bladder:  Decompressed by Foley catheter  IMPRESSION: Stable left upper pole renal cystic lesion. No other focal abnormality is noted.   Electronically Signed    By: Inez Catalina M.D.   On: 01/06/2015 11:49    Scheduled Inpatient Medications:   . amLODipine  10 mg Oral Daily  . antiseptic oral rinse  7 mL Mouth Rinse q12n4p  . aspirin EC  81 mg Oral Daily  . cefTRIAXone (ROCEPHIN)  IV  1 g Intravenous Q24H  . chlorhexidine  15 mL Mouth Rinse BID  . dutasteride  0.5 mg Oral Daily  . heparin  5,000 Units Subcutaneous 3 times per day  . hydrALAZINE  10 mg Oral 4 times per day  . insulin aspart  0-15 Units Subcutaneous 6 times per day  . metoprolol tartrate  50 mg Oral BID  . multivitamin with minerals  1 tablet Oral Daily  . pantoprazole  40 mg Oral BID  . sodium chloride  3 mL Intravenous Q12H  . tamsulosin  0.4 mg Oral QPC supper    Continuous Inpatient Infusions:     PRN Inpatient Medications:  hydrALAZINE, labetalol, sodium chloride  Miscellaneous:   Assessment:  1. Anemia-heme positive stool-multifactorial, no melena or hematemesis.  EGD and colonoscopy 3 years ago, noted as normal.    Plan:  1. awaiting result of h pylori testing.  Continue ppi daily.  Will follow at a distance.  Dr Vira Agar on call this weekend if needed.   Lollie Sails MD 01/07/2015, 4:25 PM

## 2015-01-08 LAB — BASIC METABOLIC PANEL
Anion gap: 4 — ABNORMAL LOW (ref 5–15)
BUN: 27 mg/dL — ABNORMAL HIGH (ref 6–20)
CO2: 28 mmol/L (ref 22–32)
Calcium: 9.7 mg/dL (ref 8.9–10.3)
Chloride: 111 mmol/L (ref 101–111)
Creatinine, Ser: 2.46 mg/dL — ABNORMAL HIGH (ref 0.61–1.24)
GFR calc Af Amer: 28 mL/min — ABNORMAL LOW (ref >60)
GFR calc non Af Amer: 24 mL/min — ABNORMAL LOW (ref >60)
Glucose, Bld: 101 mg/dL — ABNORMAL HIGH (ref 65–99)
Potassium: 4.9 mmol/L (ref 3.5–5.1)
SODIUM: 143 mmol/L (ref 135–145)

## 2015-01-08 LAB — CBC
HEMATOCRIT: 26.1 % — AB (ref 40.0–52.0)
HEMOGLOBIN: 8.5 g/dL — AB (ref 13.0–18.0)
MCH: 29.6 pg (ref 26.0–34.0)
MCHC: 32.7 g/dL (ref 32.0–36.0)
MCV: 90.6 fL (ref 80.0–100.0)
Platelets: 150 10*3/uL (ref 150–440)
RBC: 2.88 MIL/uL — AB (ref 4.40–5.90)
RDW: 20.5 % — ABNORMAL HIGH (ref 11.5–14.5)
WBC: 4.8 10*3/uL (ref 3.8–10.6)

## 2015-01-08 LAB — GLUCOSE, CAPILLARY
GLUCOSE-CAPILLARY: 92 mg/dL (ref 65–99)
GLUCOSE-CAPILLARY: 153 mg/dL — AB (ref 65–99)
GLUCOSE-CAPILLARY: 164 mg/dL — AB (ref 65–99)
Glucose-Capillary: 116 mg/dL — ABNORMAL HIGH (ref 65–99)
Glucose-Capillary: 166 mg/dL — ABNORMAL HIGH (ref 65–99)
Glucose-Capillary: 97 mg/dL (ref 65–99)

## 2015-01-08 MED ORDER — CEPHALEXIN 500 MG PO CAPS
500.0000 mg | ORAL_CAPSULE | Freq: Two times a day (BID) | ORAL | Status: DC
  Administered 2015-01-08 – 2015-01-09 (×3): 500 mg via ORAL
  Filled 2015-01-08 (×3): qty 1

## 2015-01-08 NOTE — Progress Notes (Signed)
Subjective:  Pt better overall.  Resting in bed. Cr down to 2.4.  Objective:  Vital signs in last 24 hours:  Temp:  [97.6 F (36.4 C)-98 F (36.7 C)] 98 F (36.7 C) (06/25 1112) Pulse Rate:  [52-65] 53 (06/25 1112) Resp:  [18-19] 18 (06/25 1112) BP: (144-166)/(49-83) 166/66 mmHg (06/25 1112) SpO2:  [100 %] 100 % (06/25 1112) Weight:  [83.87 kg (184 lb 14.4 oz)] 83.87 kg (184 lb 14.4 oz) (06/25 0541)  Weight change: 0.272 kg (9.6 oz) Filed Weights   01/04/15 1800 01/07/15 0356 01/08/15 0541  Weight: 92.54 kg (204 lb 0.2 oz) 83.598 kg (184 lb 4.8 oz) 83.87 kg (184 lb 14.4 oz)    Intake/Output: I/O last 3 completed shifts: In: 530 [P.O.:480; IV Piggyback:50] Out: 2400 [Urine:2400]     Physical Exam: General: NAD, laying in bed  HEENT Moist mucus membranes  Neck supple  Pulm/lungs Clear bilatearl, normal effort  CVS/Heart Regular, no rub  Abdomen:  Soft, non tender, BS present  Extremities: Trace peripheral edema  Neurologic: Awake, alert, follows simple commands  Skin: No acute rashes  Access:        Basic Metabolic Panel:  Recent Labs Lab 01/04/15 1808 01/05/15 0541 01/06/15 0530 01/07/15 0411 01/08/15 0459  NA 144 147* 144 143 143  K 4.7 5.0 4.6 5.0 4.9  CL 123* 118* 114* 111 111  CO2 22 26 25 26 28   GLUCOSE 58* 104* 109* 84 101*  BUN 34* 32* 26* 27* 27*  CREATININE 2.19* 2.43* 2.53* 2.65* 2.46*  CALCIUM 7.9* 8.4* 9.0 9.3 9.7  MG  --   --  1.9  --   --      CBC:  Recent Labs Lab 01/03/15 1443 01/04/15 1456 01/04/15 1808 01/05/15 0541 01/06/15 0530 01/08/15 0459  WBC 4.0 4.2 3.5* 4.5 4.8 4.8  NEUTROABS 2.5 2.8  --   --   --   --   HGB 8.4* 7.6* 7.2* 7.2* 7.8* 8.5*  HCT 26.6* 25.0* 23.1* 22.7* 25.4* 26.1*  MCV 91.1 91.9 92.3 91.4 92.4 90.6  PLT 159 135* 127* 139* 137* 150      Microbiology: Results for orders placed or performed during the hospital encounter of 01/04/15  Blood Culture (routine x 2)     Status: None (Preliminary  result)   Collection Time: 01/04/15 12:40 PM  Result Value Ref Range Status   Specimen Description BLOOD  Final   Special Requests NONE  Final   Culture NO GROWTH 4 DAYS  Final   Report Status PENDING  Incomplete  Blood Culture (routine x 2)     Status: None (Preliminary result)   Collection Time: 01/04/15 12:40 PM  Result Value Ref Range Status   Specimen Description BLOOD  Final   Special Requests NONE  Final   Culture NO GROWTH 4 DAYS  Final   Report Status PENDING  Incomplete  Urine culture     Status: None   Collection Time: 01/04/15 12:41 PM  Result Value Ref Range Status   Specimen Description URINE, RANDOM  Final   Special Requests NONE  Final   Culture NO GROWTH  Final   Report Status 01/06/2015 FINAL  Final  MRSA PCR Screening     Status: None   Collection Time: 01/04/15  5:25 PM  Result Value Ref Range Status   MRSA by PCR NEGATIVE NEGATIVE Final    Comment:        The GeneXpert MRSA Assay (FDA approved for NASAL  specimens only), is one component of a comprehensive MRSA colonization surveillance program. It is not intended to diagnose MRSA infection nor to guide or monitor treatment for MRSA infections.     Coagulation Studies: No results for input(s): LABPROT, INR in the last 72 hours.  Urinalysis: No results for input(s): COLORURINE, LABSPEC, PHURINE, GLUCOSEU, HGBUR, BILIRUBINUR, KETONESUR, PROTEINUR, UROBILINOGEN, NITRITE, LEUKOCYTESUR in the last 72 hours.  Invalid input(s): APPERANCEUR    Imaging: No results found.   Medications:     . amLODipine  10 mg Oral Daily  . antiseptic oral rinse  7 mL Mouth Rinse q12n4p  . aspirin EC  81 mg Oral Daily  . cephALEXin  500 mg Oral Q12H  . chlorhexidine  15 mL Mouth Rinse BID  . dutasteride  0.5 mg Oral Daily  . heparin  5,000 Units Subcutaneous 3 times per day  . hydrALAZINE  10 mg Oral 4 times per day  . insulin aspart  0-15 Units Subcutaneous 6 times per day  . metoprolol tartrate  50 mg Oral  BID  . multivitamin with minerals  1 tablet Oral Daily  . pantoprazole  40 mg Oral BID  . sodium chloride  3 mL Intravenous Q12H  . tamsulosin  0.4 mg Oral QPC supper   acetaminophen, labetalol, sodium chloride  Assessment/ Plan:  80 y.o. male with a medical problems of peripheral vascular disease, COPD, chronic kidney disease stage III Baseline creatinine 1.5, diabetes mellitus, hypertension, anemia of chronic kidney disease, dementia, BPH, history of colon cancer, peripheral neuropathy, gastroparesis, who was admitted to [facility] on 01/04/2015 for evaluation of weakness and altered mental status.   1. Acute renal failure/chronic kidney disease stage III Baseline creatinine 1.5/proteinuria. The patient has had prior episodes of acute renal failure. Negative spep/anca/gbm Cr now decreasing, continue supportive care for now, avoid nephrotoxins as possible.   2. Hypernatremia. Na stable at 143, continue to mointor.  3. Anemia of chronic kidney disease. hgb now up to 8.5, no indication for transfusion, may need procrit, but will consider this as outpt.   LOS: 4 Alan Mcintyre 6/25/201612:56 PM

## 2015-01-08 NOTE — Progress Notes (Signed)
Per MD patient will likely be medically stable for D/C back to [place] tomorrow (Sunday 01/09/15). Clinical Social Worker (CSW) left a voicemail with [employer] [phone] making her aware of above. CSW also contacted the group home at [phone] and spoke with staff member Jana Half. CSW made Jana Half aware of above. Per Jana Half patient will have to return via EMS because he requires a hoyer lift. Jana Half confirmed that [facility] address is the same address on the face sheet. CSW will continue to follow and assist as needed.   Blima Rich, [place] [phone]

## 2015-01-08 NOTE — Progress Notes (Signed)
Speech Language Pathology Treatment: Dysphagia  Patient Details Name: Alan Mcintyre MRN: [id number] DOB: 1934/09/19 Today's Date: 01/08/2015 Time: [id number] SLP Time Calculation (min) (ACUTE ONLY): 20 min  Assessment / Plan / Recommendation Clinical Impression  Pt appears to tolerate Dysphagia I diet w/thin liquids w/decreased risk of aspiration with verbal cues for aspiration precautions.  Pt also given trials of solid consistency. Pt tolerated puree and solid consistency well. Demonstrated one cough following multiple sips of thin via straw. Cued pt to take single sips and no other overt s/s of aspiration observed. Pt was able to masticate and clear solid consistency, however again demonstrated some impulsivity. Unsure of baseline diet, however pt reported he consumed pureed. Recommend continue w/dysphagia I diet w/thin liquids (aspiration precautions). Pt would benefit from further trials of more solid consistency in order to upgrade diet d/t c/o cognitive status and impulsivity when eating. Per chart review pt may discharge tomorrow and would recommend discharge on Dys I diet w/thin liquids. Will f/u in 1-3 days dependent on discharge.    HPI Other Pertinent Information: Pt admitted w/ sepsis; UTI; acute metabolic encephalopathy. Hypothermia; warming blanket in place. Pt is more awake/alert today.  Pt has dxs of diabetic gastroparesis and Dementia per chart. Pt reports that he consumed pureed diet prior to admission.   Pertinent Vitals Pain Assessment: No/denies pain  SLP Plan  Continue with current plan of care    Recommendations Diet recommendations: Dysphagia 1 (puree);Thin liquid Liquids provided via: Straw Medication Administration: Whole meds with puree Supervision: Staff to assist with self feeding Compensations: Small sips/bites;Slow rate;Minimize environmental distractions Postural Changes and/or Swallow Maneuvers: Seated upright 90 degrees;Upright 30-60 min after meal             Oral Care Recommendations: Oral care BID;Staff/trained caregiver to provide oral care Follow up Recommendations: Other (comment) (Group facility) Plan: Continue with current plan of care    GO     [place] 01/08/2015, 10:44 AM

## 2015-01-08 NOTE — Progress Notes (Signed)
[place] [facility] [place] NAME: Alan Mcintyre    MR#:  [id number]  DATE OF BIRTH:  [birth year]  SUBJECTIVE:  Doing ok. weak  REVIEW OF SYSTEMS:   Review of Systems  Constitutional: Negative for fever, chills and weight loss.  HENT: Negative for ear discharge, ear pain and nosebleeds.   Eyes: Negative for blurred vision, pain and discharge.  Respiratory: Negative for sputum production, shortness of breath, wheezing and stridor.   Cardiovascular: Negative for chest pain, palpitations, orthopnea and PND.  Gastrointestinal: Negative for nausea, vomiting, abdominal pain and diarrhea.  Genitourinary: Negative for urgency and frequency.  Musculoskeletal: Negative for back pain and joint pain.  Neurological: Negative for sensory change, speech change, focal weakness and weakness.  Psychiatric/Behavioral: Negative for depression. The patient is not nervous/anxious.   All other systems reviewed and are negative.  Tolerating Diet:yes Tolerating PT: no WC bound  DRUG ALLERGIES:  No Known Allergies  VITALS:  Blood pressure 166/66, pulse 53, temperature 98 F (36.7 C), temperature source Oral, resp. rate 18, height 6' (1.829 m), weight 83.87 kg (184 lb 14.4 oz), SpO2 100 %.  PHYSICAL EXAMINATION:   Physical Exam  GENERAL:  80 y.o.-year-old patient lying in the bed with no acute distress.  EYES: Pupils equal, round, reactive to light and accommodation. No scleral icterus. Extraocular muscles intact.  HEENT: Head atraumatic, normocephalic. Oropharynx and nasopharynx clear.  NECK:  Supple, no jugular venous distention. No thyroid enlargement, no tenderness.  LUNGS: Normal breath sounds bilaterally, no wheezing, rales, rhonchi. No use of accessory muscles of respiration.  CARDIOVASCULAR: S1, S2 normal. No murmurs, rubs, or gallops.  ABDOMEN: Soft, nontender, nondistended. Bowel sounds present. No organomegaly or mass.  EXTREMITIES: No cyanosis,  clubbing or edema b/l.    NEUROLOGIC: Cranial nerves II through XII are intact. No focal Motor or sensory deficits b/l.  Moves all extremities well PSYCHIATRIC: The patient is alert, has dementia SKIN: No obvious rash, lesion, or ulcer.    LABORATORY PANEL:   CBC  Recent Labs Lab 01/08/15 0459  WBC 4.8  HGB 8.5*  HCT 26.1*  PLT 150    Chemistries   Recent Labs Lab 01/04/15 1456  01/06/15 0530  01/08/15 0459  NA 146*  < > 144  < > 143  K 5.6*  < > 4.6  < > 4.9  CL 116*  < > 114*  < > 111  CO2 27  < > 25  < > 28  GLUCOSE 113*  < > 109*  < > 101*  BUN 37*  < > 26*  < > 27*  CREATININE 2.24*  < > 2.53*  < > 2.46*  CALCIUM 8.9  < > 9.0  < > 9.7  MG  --   --  1.9  --   --   AST 58*  --   --   --   --   ALT 44  --   --   --   --   ALKPHOS 73  --   --   --   --   BILITOT 0.3  --   --   --   --   < > = values in this interval not displayed.  Cardiac Enzymes  Recent Labs Lab 01/05/15 0541  TROPONINI <0.03    RADIOLOGY:  No results found.   ASSESSMENT AND PLAN:  1. Sepsis due to urinary tract infection, neg blood as well as urine cultures. on  rocephin IV--->change to po keflex Hypothermia is likely due to sepsis. Resolved now with warm blanket  -UC no growth BC neg 2. Urinary tract infection cont keflex therapy adjusted antibiotics depending on culture results 3. Acute metabolic encephalopathy: ammonia 17, improving.  -mentation better 4. Acute on Chronic renal failure: it's worsening although he's making good amount of urine (2 liters in last 24 hrs). MONITOR kidney function, nephro following. Creat 2.2->2.53->2.65--->2.4.  5. Hypernatremia/hypoglyecemia: resolved.  6. Anemia of chronic kidney disease: hb 7.8 (7.2)->7.8, Appreciate GI c/s - conservative mgmt per them.  Back to [place] in am if creat cont to improve Case discussed with Care Management/Social Worker. Management plans discussed with the patient, family and they are in agreement.  CODE STATUS:  Full  DVT Prophylaxis:heparin  TOTAL TIME TAKING CARE OF THIS PATIENT: 34minutes.  >50% time spent on counselling and coordination of care  POSSIBLE D/C IN AM DEPENDING ON CLINICAL CONDITION.   Lilliam Chamblee M.D on 01/08/2015 at 5:35 PM  Between 7am to 6pm - Pager - [phone]  After 6pm go to [email or web address] - password [email or web address] [facility]  Office  [phone]  CC: Primary care physician; Lorelee Market, MD

## 2015-01-09 LAB — CULTURE, BLOOD (ROUTINE X 2)
CULTURE: NO GROWTH
Culture: NO GROWTH

## 2015-01-09 LAB — GLUCOSE, CAPILLARY
GLUCOSE-CAPILLARY: 95 mg/dL (ref 65–99)
Glucose-Capillary: 121 mg/dL — ABNORMAL HIGH (ref 65–99)
Glucose-Capillary: 124 mg/dL — ABNORMAL HIGH (ref 65–99)
Glucose-Capillary: 74 mg/dL (ref 65–99)

## 2015-01-09 LAB — CREATININE, SERUM
CREATININE: 2.48 mg/dL — AB (ref 0.61–1.24)
GFR calc Af Amer: 27 mL/min — ABNORMAL LOW (ref >60)
GFR calc non Af Amer: 23 mL/min — ABNORMAL LOW (ref >60)

## 2015-01-09 MED ORDER — CEPHALEXIN 500 MG PO CAPS: 500.0000 mg | ORAL_CAPSULE | Freq: Two times a day (BID) | ORAL | Status: AC

## 2015-01-09 MED ORDER — INSULIN DETEMIR 100 UNIT/ML ~~LOC~~ SOLN: 20.0000 [IU] | Freq: Every day | SUBCUTANEOUS | Status: DC

## 2015-01-09 NOTE — Discharge Instructions (Signed)
Diet recommendations: Dysphagia 1 (puree);Thin liquid Liquids provided via: Straw Medication Administration: Whole meds with puree Supervision: Staff to assist with self feeding Compensations: Small sips/bites;Slow rate;Minimize environmental distractions Postural Changes and/or Swallow Maneuvers: Seated upright 90 degrees;Upright 30-60 min
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
## 2015-01-09 NOTE — Clinical Social Work Note (Signed)
Pt is ready for discharge today to [facility]. CSW confirmed with facility that they are able and ready to accept him. CSW notified RN. CSW attempted to speak with pt, however he was soundly sleeping. Per RN, pt is aware he will be discharged today. Pt will be trasported via EMS. CSW is signing off as no further needs identified.   Darden Dates, MSW, LCSW Clinical Social Worker  [phone]

## 2015-01-09 NOTE — Progress Notes (Signed)
Subjective:  Cr about the same.   In good spirits.  Laying in bed.  Objective:  Vital signs in last 24 hours:  Temp:  [97.3 F (36.3 C)-98.2 F (36.8 C)] 98.1 F (36.7 C) (06/26 1105) Pulse Rate:  [55-84] 76 (06/26 1105) Resp:  [18-20] 18 (06/26 1105) BP: (137-156)/(54-75) 149/75 mmHg (06/26 1105) SpO2:  [98 %-100 %] 98 % (06/26 1105)  Weight change:  Filed Weights   01/04/15 1800 01/07/15 0356 01/08/15 0541  Weight: 92.54 kg (204 lb 0.2 oz) 83.598 kg (184 lb 4.8 oz) 83.87 kg (184 lb 14.4 oz)    Intake/Output: I/O last 3 completed shifts: In: 240 [P.O.:240] Out: 0      Physical Exam: General: NAD, laying in bed  HEENT Moist mucus membranes EOMI hearing intact  Neck supple  Pulm/lungs Clear bilateral, normal effort  CVS/Heart Regular, no rub  Abdomen:  Soft, non tender, BS present  Extremities: no peripheral edema  Neurologic: Awake, alert, follows simple commands  Skin: No acute rashes          Basic Metabolic Panel:  Recent Labs Lab 01/04/15 1808 01/05/15 0541 01/06/15 0530 01/07/15 0411 01/08/15 0459 01/09/15 0654  NA 144 147* 144 143 143  --   K 4.7 5.0 4.6 5.0 4.9  --   CL 123* 118* 114* 111 111  --   CO2 22 26 25 26 28   --   GLUCOSE 58* 104* 109* 84 101*  --   BUN 34* 32* 26* 27* 27*  --   CREATININE 2.19* 2.43* 2.53* 2.65* 2.46* 2.48*  CALCIUM 7.9* 8.4* 9.0 9.3 9.7  --   MG  --   --  1.9  --   --   --      CBC:  Recent Labs Lab 01/03/15 1443 01/04/15 1456 01/04/15 1808 01/05/15 0541 01/06/15 0530 01/08/15 0459  WBC 4.0 4.2 3.5* 4.5 4.8 4.8  NEUTROABS 2.5 2.8  --   --   --   --   HGB 8.4* 7.6* 7.2* 7.2* 7.8* 8.5*  HCT 26.6* 25.0* 23.1* 22.7* 25.4* 26.1*  MCV 91.1 91.9 92.3 91.4 92.4 90.6  PLT 159 135* 127* 139* 137* 150      Microbiology: Results for orders placed or performed during the hospital encounter of 01/04/15  Blood Culture (routine x 2)     Status: None   Collection Time: 01/04/15 12:40 PM  Result Value Ref  Range Status   Specimen Description BLOOD  Final   Special Requests NONE  Final   Culture NO GROWTH 5 DAYS  Final   Report Status 01/09/2015 FINAL  Final  Blood Culture (routine x 2)     Status: None   Collection Time: 01/04/15 12:40 PM  Result Value Ref Range Status   Specimen Description BLOOD  Final   Special Requests NONE  Final   Culture NO GROWTH 5 DAYS  Final   Report Status 01/09/2015 FINAL  Final  Urine culture     Status: None   Collection Time: 01/04/15 12:41 PM  Result Value Ref Range Status   Specimen Description URINE, RANDOM  Final   Special Requests NONE  Final   Culture NO GROWTH  Final   Report Status 01/06/2015 FINAL  Final  MRSA PCR Screening     Status: None   Collection Time: 01/04/15  5:25 PM  Result Value Ref Range Status   MRSA by PCR NEGATIVE NEGATIVE Final    Comment:  The GeneXpert MRSA Assay (FDA approved for NASAL specimens only), is one component of a comprehensive MRSA colonization surveillance program. It is not intended to diagnose MRSA infection nor to guide or monitor treatment for MRSA infections.     Coagulation Studies: No results for input(s): LABPROT, INR in the last 72 hours.  Urinalysis: No results for input(s): COLORURINE, LABSPEC, PHURINE, GLUCOSEU, HGBUR, BILIRUBINUR, KETONESUR, PROTEINUR, UROBILINOGEN, NITRITE, LEUKOCYTESUR in the last 72 hours.  Invalid input(s): APPERANCEUR    Imaging: No results found.   Medications:     . amLODipine  10 mg Oral Daily  . antiseptic oral rinse  7 mL Mouth Rinse q12n4p  . aspirin EC  81 mg Oral Daily  . cephALEXin  500 mg Oral Q12H  . chlorhexidine  15 mL Mouth Rinse BID  . dutasteride  0.5 mg Oral Daily  . heparin  5,000 Units Subcutaneous 3 times per day  . hydrALAZINE  10 mg Oral 4 times per day  . insulin aspart  0-15 Units Subcutaneous 6 times per day  . metoprolol tartrate  50 mg Oral BID  . multivitamin with minerals  1 tablet Oral Daily  . pantoprazole  40 mg  Oral BID  . sodium chloride  3 mL Intravenous Q12H  . tamsulosin  0.4 mg Oral QPC supper   acetaminophen, labetalol, sodium chloride  Assessment/ Plan:  80 y.o. male with a medical problems of peripheral vascular disease, COPD, chronic kidney disease stage III Baseline creatinine 1.5, diabetes mellitus, hypertension, anemia of chronic kidney disease, dementia, BPH, history of colon cancer, peripheral neuropathy, gastroparesis, who was admitted to [facility] on 01/04/2015 for evaluation of weakness and altered mental status.   1. Acute renal failure/chronic kidney disease stage III Baseline creatinine 1.5/proteinuria. The patient has had prior episodes of acute renal failure. Negative spep/anca/gbm Cr about the same, remains above his baseline of 1.5.  May take some time to settle back down.  Will need outpt monitoring of trend.  2. Hypernatremia. Na improved after giving D5W.    3. Anemia of chronic kidney disease. Last hgb 8.5, will monitor as outpt, may need epogen as outpt.   LOS: 5 Tyan Dy 6/26/201611:32 AM

## 2015-01-10 LAB — H PYLORI, IGM, IGG, IGA AB: H PYLORI IGG: 1.6 U/mL — AB (ref 0.0–0.8)

## 2015-01-17 ENCOUNTER — Emergency Department: Payer: Medicare Other

## 2015-01-17 ENCOUNTER — Inpatient Hospital Stay
Admission: EM | Admit: 2015-01-17 | Discharge: 2015-01-27 | DRG: 871 | Disposition: A | Discharge status: Skilled Nursing Facility | Payer: Medicare Other | Attending: Internal Medicine | Admitting: Internal Medicine

## 2015-01-17 DIAGNOSIS — Z85038 Personal history of other malignant neoplasm of large intestine: Secondary | ICD-10-CM | POA: Diagnosis not present

## 2015-01-17 DIAGNOSIS — Z87891 Personal history of nicotine dependence: Secondary | ICD-10-CM | POA: Diagnosis not present

## 2015-01-17 DIAGNOSIS — R471 Dysarthria and anarthria: Secondary | ICD-10-CM | POA: Diagnosis present

## 2015-01-17 DIAGNOSIS — N179 Acute kidney failure, unspecified: Secondary | ICD-10-CM | POA: Diagnosis present

## 2015-01-17 DIAGNOSIS — Z515 Encounter for palliative care: Secondary | ICD-10-CM

## 2015-01-17 DIAGNOSIS — J441 Chronic obstructive pulmonary disease with (acute) exacerbation: Secondary | ICD-10-CM | POA: Diagnosis present

## 2015-01-17 DIAGNOSIS — E1122 Type 2 diabetes mellitus with diabetic chronic kidney disease: Secondary | ICD-10-CM | POA: Diagnosis present

## 2015-01-17 DIAGNOSIS — J9602 Acute respiratory failure with hypercapnia: Secondary | ICD-10-CM | POA: Diagnosis present

## 2015-01-17 DIAGNOSIS — I739 Peripheral vascular disease, unspecified: Secondary | ICD-10-CM | POA: Diagnosis present

## 2015-01-17 DIAGNOSIS — J9601 Acute respiratory failure with hypoxia: Secondary | ICD-10-CM | POA: Diagnosis present

## 2015-01-17 DIAGNOSIS — R6520 Severe sepsis without septic shock: Secondary | ICD-10-CM | POA: Diagnosis present

## 2015-01-17 DIAGNOSIS — G9341 Metabolic encephalopathy: Secondary | ICD-10-CM | POA: Diagnosis present

## 2015-01-17 DIAGNOSIS — D631 Anemia in chronic kidney disease: Secondary | ICD-10-CM | POA: Diagnosis present

## 2015-01-17 DIAGNOSIS — R1310 Dysphagia, unspecified: Secondary | ICD-10-CM | POA: Diagnosis present

## 2015-01-17 DIAGNOSIS — A419 Sepsis, unspecified organism: Principal | ICD-10-CM | POA: Diagnosis present

## 2015-01-17 DIAGNOSIS — Z66 Do not resuscitate: Secondary | ICD-10-CM | POA: Diagnosis not present

## 2015-01-17 DIAGNOSIS — E1143 Type 2 diabetes mellitus with diabetic autonomic (poly)neuropathy: Secondary | ICD-10-CM | POA: Diagnosis present

## 2015-01-17 DIAGNOSIS — F0390 Unspecified dementia without behavioral disturbance: Secondary | ICD-10-CM | POA: Diagnosis present

## 2015-01-17 DIAGNOSIS — N400 Enlarged prostate without lower urinary tract symptoms: Secondary | ICD-10-CM | POA: Diagnosis present

## 2015-01-17 DIAGNOSIS — N183 Chronic kidney disease, stage 3 (moderate): Secondary | ICD-10-CM | POA: Diagnosis present

## 2015-01-17 DIAGNOSIS — K3184 Gastroparesis: Secondary | ICD-10-CM | POA: Diagnosis present

## 2015-01-17 DIAGNOSIS — T68XXXA Hypothermia, initial encounter: Secondary | ICD-10-CM | POA: Diagnosis present

## 2015-01-17 DIAGNOSIS — Z7982 Long term (current) use of aspirin: Secondary | ICD-10-CM

## 2015-01-17 DIAGNOSIS — E876 Hypokalemia: Secondary | ICD-10-CM | POA: Diagnosis present

## 2015-01-17 DIAGNOSIS — G9340 Encephalopathy, unspecified: Secondary | ICD-10-CM | POA: Diagnosis not present

## 2015-01-17 DIAGNOSIS — I509 Heart failure, unspecified: Secondary | ICD-10-CM | POA: Diagnosis present

## 2015-01-17 DIAGNOSIS — J690 Pneumonitis due to inhalation of food and vomit: Secondary | ICD-10-CM | POA: Diagnosis present

## 2015-01-17 DIAGNOSIS — N189 Chronic kidney disease, unspecified: Secondary | ICD-10-CM

## 2015-01-17 DIAGNOSIS — I129 Hypertensive chronic kidney disease with stage 1 through stage 4 chronic kidney disease, or unspecified chronic kidney disease: Secondary | ICD-10-CM | POA: Diagnosis present

## 2015-01-17 DIAGNOSIS — Z794 Long term (current) use of insulin: Secondary | ICD-10-CM

## 2015-01-17 DIAGNOSIS — N390 Urinary tract infection, site not specified: Secondary | ICD-10-CM | POA: Diagnosis present

## 2015-01-17 DIAGNOSIS — D649 Anemia, unspecified: Secondary | ICD-10-CM | POA: Diagnosis present

## 2015-01-17 DIAGNOSIS — E1142 Type 2 diabetes mellitus with diabetic polyneuropathy: Secondary | ICD-10-CM | POA: Diagnosis present

## 2015-01-17 DIAGNOSIS — R0602 Shortness of breath: Secondary | ICD-10-CM

## 2015-01-17 DIAGNOSIS — E870 Hyperosmolality and hypernatremia: Secondary | ICD-10-CM | POA: Diagnosis present

## 2015-01-17 LAB — CBC WITH DIFFERENTIAL/PLATELET
Basophils Absolute: 0 10*3/uL (ref 0–0.1)
Basophils Relative: 1 %
EOS PCT: 1 %
Eosinophils Absolute: 0.1 10*3/uL (ref 0–0.7)
HCT: 23.1 % — ABNORMAL LOW (ref 40.0–52.0)
HEMOGLOBIN: 7.3 g/dL — AB (ref 13.0–18.0)
Lymphocytes Relative: 16 %
Lymphs Abs: 0.9 10*3/uL — ABNORMAL LOW (ref 1.0–3.6)
MCH: 29.8 pg (ref 26.0–34.0)
MCHC: 31.5 g/dL — ABNORMAL LOW (ref 32.0–36.0)
MCV: 94.5 fL (ref 80.0–100.0)
MONO ABS: 0.4 10*3/uL (ref 0.2–1.0)
Monocytes Relative: 7 %
NEUTROS PCT: 75 %
Neutro Abs: 3.9 10*3/uL (ref 1.4–6.5)
Platelets: 150 10*3/uL (ref 150–440)
RBC: 2.44 MIL/uL — ABNORMAL LOW (ref 4.40–5.90)
RDW: 20.8 % — ABNORMAL HIGH (ref 11.5–14.5)
WBC: 5.3 10*3/uL (ref 3.8–10.6)

## 2015-01-17 LAB — COMPREHENSIVE METABOLIC PANEL
ALBUMIN: 3 g/dL — AB (ref 3.5–5.0)
ALT: 26 U/L (ref 17–63)
AST: 25 U/L (ref 15–41)
Alkaline Phosphatase: 87 U/L (ref 38–126)
Anion gap: 5 (ref 5–15)
BUN: 52 mg/dL — ABNORMAL HIGH (ref 6–20)
CO2: 24 mmol/L (ref 22–32)
Calcium: 8.9 mg/dL (ref 8.9–10.3)
Chloride: 112 mmol/L — ABNORMAL HIGH (ref 101–111)
Creatinine, Ser: 3.29 mg/dL — ABNORMAL HIGH (ref 0.61–1.24)
GFR calc Af Amer: 19 mL/min — ABNORMAL LOW (ref >60)
GFR, EST NON AFRICAN AMERICAN: 17 mL/min — AB (ref >60)
GLUCOSE: 197 mg/dL — AB (ref 65–99)
Potassium: 4.6 mmol/L (ref 3.5–5.1)
SODIUM: 141 mmol/L (ref 135–145)
Total Bilirubin: 0.4 mg/dL (ref 0.3–1.2)
Total Protein: 6.3 g/dL — ABNORMAL LOW (ref 6.5–8.1)

## 2015-01-17 LAB — BLOOD GAS, ARTERIAL
ACID-BASE DEFICIT: 6.1 mmol/L — AB (ref 0.0–2.0)
ALLENS TEST (PASS/FAIL): POSITIVE — AB
Acid-base deficit: 4.4 mmol/L — ABNORMAL HIGH (ref 0.0–2.0)
BICARBONATE: 21.7 meq/L (ref 21.0–28.0)
BICARBONATE: 22.6 meq/L (ref 21.0–28.0)
FIO2: 28 %
FIO2: 0.28 %
O2 Saturation: 90.1 %
O2 Saturation: 93.8 %
PATIENT TEMPERATURE: 37
PH ART: 7.22 — AB (ref 7.350–7.450)
PO2 ART: 58 mmHg — AB (ref 83.0–108.0)
PO2 ART: 67 mmHg — AB (ref 83.0–108.0)
Patient temperature: 34.6
pCO2 arterial: 53 mmHg — ABNORMAL HIGH (ref 32.0–48.0)
pCO2 arterial: 43 mmHg (ref 32.0–48.0)
pH, Arterial: 7.31 — ABNORMAL LOW (ref 7.350–7.450)

## 2015-01-17 LAB — URINALYSIS COMPLETE WITH MICROSCOPIC (ARMC ONLY)
BILIRUBIN URINE: NEGATIVE
GLUCOSE, UA: 50 mg/dL — AB
Ketones, ur: NEGATIVE mg/dL
Nitrite: NEGATIVE
Protein, ur: 100 mg/dL — AB
Specific Gravity, Urine: 1.016 (ref 1.005–1.030)
pH: 5 (ref 5.0–8.0)

## 2015-01-17 LAB — CBC
HEMATOCRIT: 21.8 % — AB (ref 40.0–52.0)
HEMOGLOBIN: 6.9 g/dL — AB (ref 13.0–18.0)
MCH: 30 pg (ref 26.0–34.0)
MCHC: 31.5 g/dL — ABNORMAL LOW (ref 32.0–36.0)
MCV: 95.2 fL (ref 80.0–100.0)
PLATELETS: 169 10*3/uL (ref 150–440)
RBC: 2.29 MIL/uL — ABNORMAL LOW (ref 4.40–5.90)
RDW: 20.8 % — AB (ref 11.5–14.5)
WBC: 4.2 10*3/uL (ref 3.8–10.6)

## 2015-01-17 LAB — LACTIC ACID, PLASMA
LACTIC ACID, VENOUS: 1.2 mmol/L (ref 0.5–2.0)
Lactic Acid, Venous: 0.9 mmol/L (ref 0.5–2.0)

## 2015-01-17 LAB — CREATININE, SERUM
Creatinine, Ser: 3.31 mg/dL — ABNORMAL HIGH (ref 0.61–1.24)
GFR calc Af Amer: 19 mL/min — ABNORMAL LOW (ref >60)
GFR, EST NON AFRICAN AMERICAN: 17 mL/min — AB (ref >60)

## 2015-01-17 LAB — GLUCOSE, CAPILLARY: Glucose-Capillary: 147 mg/dL — ABNORMAL HIGH (ref 65–99)

## 2015-01-17 LAB — BRAIN NATRIURETIC PEPTIDE: B NATRIURETIC PEPTIDE 5: 491 pg/mL — AB (ref 0.0–100.0)

## 2015-01-17 LAB — TSH: TSH: 4.02 u[IU]/mL (ref 0.350–4.500)

## 2015-01-17 LAB — TROPONIN I: Troponin I: <0.03 ng/mL (ref <0.031)

## 2015-01-17 LAB — LIPASE, BLOOD: Lipase: 53 U/L — ABNORMAL HIGH (ref 22–51)

## 2015-01-17 LAB — MRSA PCR SCREENING: MRSA by PCR: NEGATIVE

## 2015-01-17 MED ORDER — VITAMIN D3 25 MCG (1000 UNIT) PO TABS
1000.0000 [IU] | ORAL_TABLET | Freq: Every day | ORAL | Status: DC
Start: 2015-01-17 — End: 2015-01-27
  Administered 2015-01-21 – 2015-01-27 (×7): 1000 [IU] via ORAL
  Filled 2015-01-17 (×18): qty 1

## 2015-01-17 MED ORDER — PIPERACILLIN-TAZOBACTAM 3.375 G IVPB 30 MIN
3.3750 g | Freq: Once | INTRAVENOUS | Status: AC
  Administered 2015-01-17: 3.375 g via INTRAVENOUS

## 2015-01-17 MED ORDER — SODIUM CHLORIDE 0.9 % IV SOLN: 250.0000 mL | INTRAVENOUS | Status: DC | PRN

## 2015-01-17 MED ORDER — POLYETHYLENE GLYCOL 3350 17 G PO PACK
17.0000 g | PACK | Freq: Every morning | ORAL | Status: DC
  Administered 2015-01-21 – 2015-01-27 (×3): 17 g via ORAL
  Filled 2015-01-17 (×4): qty 1

## 2015-01-17 MED ORDER — ACETAMINOPHEN 325 MG PO TABS: 650.0000 mg | ORAL_TABLET | Freq: Four times a day (QID) | ORAL | Status: DC | PRN

## 2015-01-17 MED ORDER — PIPERACILLIN-TAZOBACTAM 3.375 G IVPB
3.3750 g | Freq: Three times a day (TID) | INTRAVENOUS | Status: DC
  Administered 2015-01-17 – 2015-01-18 (×2): 3.375 g via INTRAVENOUS
  Filled 2015-01-17 (×6): qty 50

## 2015-01-17 MED ORDER — DUTASTERIDE 0.5 MG PO CAPS
0.5000 mg | ORAL_CAPSULE | Freq: Every day | ORAL | Status: DC
  Administered 2015-01-21 – 2015-01-27 (×7): 0.5 mg via ORAL
  Filled 2015-01-17 (×9): qty 1

## 2015-01-17 MED ORDER — SODIUM CHLORIDE 0.9 % IJ SOLN
3.0000 mL | INTRAMUSCULAR | Status: DC | PRN
  Administered 2015-01-19: 3 mL via INTRAVENOUS
  Filled 2015-01-17: qty 10

## 2015-01-17 MED ORDER — PIPERACILLIN-TAZOBACTAM 3.375 G IVPB
INTRAVENOUS | Status: AC
  Administered 2015-01-17: 3.375 g via INTRAVENOUS
  Filled 2015-01-17: qty 50

## 2015-01-17 MED ORDER — INSULIN ASPART 100 UNIT/ML ~~LOC~~ SOLN
0.0000 [IU] | Freq: Three times a day (TID) | SUBCUTANEOUS | Status: DC
  Administered 2015-01-19: 1 [IU] via SUBCUTANEOUS
  Administered 2015-01-20: 5 [IU] via SUBCUTANEOUS
  Filled 2015-01-17: qty 1
  Filled 2015-01-17: qty 5
  Filled 2015-01-17: qty 1

## 2015-01-17 MED ORDER — FUROSEMIDE 10 MG/ML IJ SOLN
20.0000 mg | Freq: Two times a day (BID) | INTRAMUSCULAR | Status: DC
  Administered 2015-01-17 – 2015-01-18 (×2): 20 mg via INTRAVENOUS
  Filled 2015-01-17: qty 2

## 2015-01-17 MED ORDER — SODIUM CHLORIDE 0.9 % IV BOLUS (SEPSIS)
1000.0000 mL | INTRAVENOUS | Status: AC
  Administered 2015-01-17 (×2): 1000 mL via INTRAVENOUS

## 2015-01-17 MED ORDER — HEPARIN SODIUM (PORCINE) 5000 UNIT/ML IJ SOLN
5000.0000 [IU] | Freq: Three times a day (TID) | INTRAMUSCULAR | Status: DC
  Administered 2015-01-17 – 2015-01-27 (×29): 5000 [IU] via SUBCUTANEOUS
  Filled 2015-01-17 (×29): qty 1

## 2015-01-17 MED ORDER — ONDANSETRON HCL 4 MG/2ML IJ SOLN: 4.0000 mg | Freq: Four times a day (QID) | INTRAMUSCULAR | Status: DC | PRN

## 2015-01-17 MED ORDER — TAMSULOSIN HCL 0.4 MG PO CAPS
0.4000 mg | ORAL_CAPSULE | Freq: Every day | ORAL | Status: DC
  Administered 2015-01-21 – 2015-01-27 (×7): 0.4 mg via ORAL
  Filled 2015-01-17 (×7): qty 1

## 2015-01-17 MED ORDER — SODIUM CHLORIDE 0.9 % IJ SOLN
3.0000 mL | Freq: Two times a day (BID) | INTRAMUSCULAR | Status: DC
  Administered 2015-01-17 – 2015-01-27 (×16): 3 mL via INTRAVENOUS

## 2015-01-17 MED ORDER — DOCUSATE SODIUM 100 MG PO CAPS
100.0000 mg | ORAL_CAPSULE | Freq: Two times a day (BID) | ORAL | Status: DC
  Administered 2015-01-20 – 2015-01-27 (×8): 100 mg via ORAL
  Filled 2015-01-17 (×9): qty 1

## 2015-01-17 MED ORDER — VANCOMYCIN HCL IN DEXTROSE 1-5 GM/200ML-% IV SOLN
INTRAVENOUS | Status: AC
  Administered 2015-01-17: 1000 mg via INTRAVENOUS
  Filled 2015-01-17: qty 200

## 2015-01-17 MED ORDER — SACCHAROMYCES BOULARDII 250 MG PO CAPS
250.0000 mg | ORAL_CAPSULE | Freq: Three times a day (TID) | ORAL | Status: DC
  Administered 2015-01-20 – 2015-01-27 (×21): 250 mg via ORAL
  Filled 2015-01-17 (×32): qty 1

## 2015-01-17 MED ORDER — FUROSEMIDE 10 MG/ML IJ SOLN
INTRAMUSCULAR | Status: AC
  Administered 2015-01-17: 20 mg via INTRAVENOUS
  Filled 2015-01-17: qty 4

## 2015-01-17 MED ORDER — METOPROLOL TARTRATE 50 MG PO TABS
50.0000 mg | ORAL_TABLET | Freq: Two times a day (BID) | ORAL | Status: DC
Start: 2015-01-17 — End: 2015-01-26
  Administered 2015-01-20 – 2015-01-26 (×12): 50 mg via ORAL
  Filled 2015-01-17 (×12): qty 1

## 2015-01-17 MED ORDER — PIPERACILLIN-TAZOBACTAM 3.375 G IVPB 30 MIN: 3.3750 g | Freq: Three times a day (TID) | INTRAVENOUS | Status: DC

## 2015-01-17 MED ORDER — ONDANSETRON HCL 4 MG PO TABS: 4.0000 mg | ORAL_TABLET | Freq: Four times a day (QID) | ORAL | Status: DC | PRN

## 2015-01-17 MED ORDER — MAGNESIUM OXIDE 400 MG PO TABS
400.0000 mg | ORAL_TABLET | Freq: Every day | ORAL | Status: DC
  Administered 2015-01-21 – 2015-01-27 (×7): 400 mg via ORAL
  Filled 2015-01-17 (×18): qty 1

## 2015-01-17 MED ORDER — VANCOMYCIN HCL IN DEXTROSE 1-5 GM/200ML-% IV SOLN
1000.0000 mg | INTRAVENOUS | Status: DC
  Administered 2015-01-17: 1000 mg via INTRAVENOUS
  Filled 2015-01-17 (×2): qty 200

## 2015-01-17 MED ORDER — ASPIRIN EC 81 MG PO TBEC
81.0000 mg | DELAYED_RELEASE_TABLET | Freq: Every day | ORAL | Status: DC
  Administered 2015-01-21 – 2015-01-27 (×7): 81 mg via ORAL
  Filled 2015-01-17 (×7): qty 1

## 2015-01-17 MED ORDER — VANCOMYCIN HCL IN DEXTROSE 1-5 GM/200ML-% IV SOLN
1000.0000 mg | Freq: Once | INTRAVENOUS | Status: AC
  Administered 2015-01-17: 1000 mg via INTRAVENOUS

## 2015-01-17 MED ORDER — ACETAMINOPHEN 650 MG RE SUPP: 650.0000 mg | Freq: Four times a day (QID) | RECTAL | Status: DC | PRN

## 2015-01-17 NOTE — Progress Notes (Signed)
Patient confused and lethargic- NSR in 60's on monitor.  Vitals stable. Per Dr. Posey Pronto- do not give bolus or maintenance  Fluids- Foley placed.  Bair hugger in place- temp 34.6- Pt resting comfortably at this time.
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
## 2015-01-17 NOTE — ED Notes (Signed)
Per EMS nursing home reports dyspnea, states facility is on [address], states report did not give length of time pt has been dyspnic or patient baseline or history. Pt noted with moist cough and distended abdomen on arrival. Does answer questions but difficult to understand.
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
## 2015-01-18 ENCOUNTER — Inpatient Hospital Stay
Admit: 2015-01-18 | Discharge: 2015-01-18 | Disposition: A | Discharge status: Home / Self Care | Payer: Medicare Other | Attending: Internal Medicine | Admitting: Internal Medicine

## 2015-01-18 DIAGNOSIS — N179 Acute kidney failure, unspecified: Secondary | ICD-10-CM | POA: Diagnosis present

## 2015-01-18 DIAGNOSIS — J690 Pneumonitis due to inhalation of food and vomit: Secondary | ICD-10-CM | POA: Diagnosis present

## 2015-01-18 DIAGNOSIS — G9340 Encephalopathy, unspecified: Secondary | ICD-10-CM | POA: Diagnosis present

## 2015-01-18 LAB — LACTIC ACID, PLASMA
LACTIC ACID, VENOUS: 1 mmol/L (ref 0.5–2.0)
Lactic Acid, Venous: 1.1 mmol/L (ref 0.5–2.0)

## 2015-01-18 LAB — BLOOD GAS, ARTERIAL
ACID-BASE DEFICIT: 2.5 mmol/L — AB (ref 0.0–2.0)
Allens test (pass/fail): POSITIVE — AB
Bicarbonate: 24.2 mEq/L (ref 21.0–28.0)
FIO2: 28 %
O2 Saturation: 93.2 %
PH ART: 7.32 — AB (ref 7.350–7.450)
Patient temperature: 36.4
pCO2 arterial: 47 mmHg (ref 32.0–48.0)
pO2, Arterial: 71 mmHg — ABNORMAL LOW (ref 83.0–108.0)

## 2015-01-18 LAB — CBC
HCT: 21.2 % — ABNORMAL LOW (ref 40.0–52.0)
Hemoglobin: 6.6 g/dL — ABNORMAL LOW (ref 13.0–18.0)
MCH: 29.4 pg (ref 26.0–34.0)
MCHC: 31.3 g/dL — AB (ref 32.0–36.0)
MCV: 93.9 fL (ref 80.0–100.0)
Platelets: 197 10*3/uL (ref 150–440)
RBC: 2.26 MIL/uL — ABNORMAL LOW (ref 4.40–5.90)
RDW: 21.4 % — AB (ref 11.5–14.5)
WBC: 5.3 10*3/uL (ref 3.8–10.6)

## 2015-01-18 LAB — BASIC METABOLIC PANEL
ANION GAP: 5 (ref 5–15)
BUN: 47 mg/dL — AB (ref 6–20)
CO2: 24 mmol/L (ref 22–32)
CREATININE: 3.48 mg/dL — AB (ref 0.61–1.24)
Calcium: 8.7 mg/dL — ABNORMAL LOW (ref 8.9–10.3)
Chloride: 117 mmol/L — ABNORMAL HIGH (ref 101–111)
GFR calc Af Amer: 18 mL/min — ABNORMAL LOW (ref >60)
GFR, EST NON AFRICAN AMERICAN: 16 mL/min — AB (ref >60)
Glucose, Bld: 95 mg/dL (ref 65–99)
Potassium: 5 mmol/L (ref 3.5–5.1)
Sodium: 146 mmol/L — ABNORMAL HIGH (ref 135–145)

## 2015-01-18 LAB — GLUCOSE, CAPILLARY
Glucose-Capillary: 125 mg/dL — ABNORMAL HIGH (ref 65–99)
Glucose-Capillary: 82 mg/dL (ref 65–99)
Glucose-Capillary: 84 mg/dL (ref 65–99)
Glucose-Capillary: 85 mg/dL (ref 65–99)

## 2015-01-18 LAB — HEMOGLOBIN: HEMOGLOBIN: 6.7 g/dL — AB (ref 13.0–18.0)

## 2015-01-18 MED ORDER — CHLORHEXIDINE GLUCONATE 0.12 % MT SOLN
15.0000 mL | Freq: Two times a day (BID) | OROMUCOSAL | Status: DC
  Administered 2015-01-18 – 2015-01-27 (×17): 15 mL via OROMUCOSAL

## 2015-01-18 MED ORDER — METHYLPREDNISOLONE SODIUM SUCC 125 MG IJ SOLR
60.0000 mg | INTRAMUSCULAR | Status: DC
  Administered 2015-01-18 – 2015-01-24 (×7): 60 mg via INTRAVENOUS
  Filled 2015-01-18 (×7): qty 2

## 2015-01-18 MED ORDER — CETYLPYRIDINIUM CHLORIDE 0.05 % MT LIQD
7.0000 mL | Freq: Two times a day (BID) | OROMUCOSAL | Status: DC
  Administered 2015-01-18 – 2015-01-27 (×19): 7 mL via OROMUCOSAL

## 2015-01-18 MED ORDER — DEXTROSE-NACL 5-0.45 % IV SOLN
INTRAVENOUS | Status: DC
  Administered 2015-01-18: 13:00:00 via INTRAVENOUS

## 2015-01-18 MED ORDER — VANCOMYCIN HCL IN DEXTROSE 1-5 GM/200ML-% IV SOLN
1000.0000 mg | INTRAVENOUS | Status: DC
  Administered 2015-01-19 – 2015-01-23 (×4): 1000 mg via INTRAVENOUS
  Filled 2015-01-18 (×5): qty 200

## 2015-01-18 MED ORDER — PIPERACILLIN-TAZOBACTAM 3.375 G IVPB
3.3750 g | Freq: Two times a day (BID) | INTRAVENOUS | Status: DC
  Administered 2015-01-18 – 2015-01-21 (×6): 3.375 g via INTRAVENOUS
  Filled 2015-01-18 (×8): qty 50
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
## 2015-01-18 NOTE — Progress Notes (Signed)
Notified Sudini about Hgb dropping from 6.9 to 6.6 since admission. He is going to call family when he rounds for consent to give blood.  Per Kasa hold his PO medications due to lethargy. Will reevaluate nutritional status in another 24 hours. Pt is currently lethargic and sleepy.

## 2015-01-18 NOTE — Progress Notes (Signed)
*  PRELIMINARY RESULTS* Echocardiogram 2D Echocardiogram has been performed.  Alan Mcintyre 01/18/2015, 1:47 PM

## 2015-01-18 NOTE — Progress Notes (Signed)
Speech Therapy Note: received order; consulted NSG re: pt's status today. Pt is poorly arousable, lethargic to verbal and tactile stim. NSG reported same. ST will f/u when pt is more arousable and safe to participate in the BSE. Currently NPO. Rec. Oral care daily.

## 2015-01-18 NOTE — Care Management Note (Signed)
Case Management Note  Patient Details  Name: Elgie Hansing MRN: [id number] Date of Birth: [birth year]  Subjective/Objective:  Presents from [facility] with decrease in responsiveness and dyspnea. Now with acute respiratory failure and sepsis.  Readmit. Recently at [facility] 06/21-06/26 for UTI and Sepsis.  CSW Baxter Flattery) made aware of admission.                 Action/Plan:   Expected Discharge Date:                  Expected Discharge Plan:  Skilled Nursing Facility  In-House Referral:  Clinical Social Work  Discharge planning Services     Post Acute Care Choice:    Choice offered to:     DME Arranged:    DME Agency:     HH Arranged:    [place] Agency:     Status of Service:     Medicare Important Message Given:    Date Medicare IM Given:    Medicare IM give by:    Date Additional Medicare IM Given:    Additional Medicare Important Message give by:     If discussed at [place] of Stay Meetings, dates discussed:    Additional Comments:  Jolly Mango, RN 01/18/2015, 9:14 AM

## 2015-01-18 NOTE — Progress Notes (Signed)
[facility] [place] NAME: Alan Mcintyre    MR#:  [id number]  DATE OF BIRTH:  1935/05/15  SUBJECTIVE:  CHIEF COMPLAINT:   Chief Complaint  Patient presents with  . Shortness of Breath    from nursing home with reported dyspnea   Drowzy. Admitted for AMS and Hypoxia. REVIEW OF SYSTEMS:    ROS  Unobtainable due to encephalopathy  DRUG ALLERGIES:  No Known Allergies  VITALS:  Blood pressure 127/55, pulse 84, temperature 97.7 F (36.5 C), temperature source Rectal, resp. rate 15, weight 90.719 kg (200 lb), SpO2 100 %.  PHYSICAL EXAMINATION:   Physical Exam  GENERAL:  80 y.o.-year-old patient lying in the bed with no acute distress. Pale EYES: Pupils equal, round, reactive to light and accommodation. No scleral icterus. Extraocular muscles intact.  HEENT: Head atraumatic, normocephalic. Oropharynx and nasopharynx clear.  NECK:  Supple, no jugular venous distention. No thyroid enlargement, no tenderness.  LUNGS: Coarse BS. CARDIOVASCULAR: S1, S2 normal. No murmurs, rubs, or gallops.  ABDOMEN: Soft, nontender, nondistended. Bowel sounds present. No organomegaly or mass.  EXTREMITIES: No cyanosis, clubbing. 2 + edema LE. NEUROLOGIC: Cranial nerves II through XII are intact. No focal Motor or sensory deficits b/l.   PSYCHIATRIC: The patient is drowzy SKIN: No obvious rash, lesion, or ulcer.    LABORATORY PANEL:   CBC  Recent Labs Lab 01/18/15 0624  WBC 5.3  HGB 6.6*  HCT 21.2*  PLT 197   ------------------------------------------------------------------------------------------------------------------  Chemistries   Recent Labs Lab 01/17/15 1116  01/18/15 0624  NA 141  --  146*  K 4.6  --  5.0  CL 112*  --  117*  CO2 24  --  24  GLUCOSE 197*  --  95  BUN 52*  --  47*  CREATININE 3.29*  < > 3.48*  CALCIUM 8.9  --  8.7*  AST 25  --   --   ALT 26  --   --   ALKPHOS 87  --   --   BILITOT 0.4  --   --    < > = values in this interval not displayed. ------------------------------------------------------------------------------------------------------------------  Cardiac Enzymes  Recent Labs Lab 01/17/15 1116  TROPONINI <0.03   ------------------------------------------------------------------------------------------------------------------  RADIOLOGY:  Dg Chest Port 1 View  01/17/2015   CLINICAL DATA:  Dyspnea/short of breath.  EXAM: PORTABLE CHEST - 1 VIEW  COMPARISON:  01/04/2015.  FINDINGS: Cardiopericardial silhouette within normal limits for projection. Mediastinal contours are unchanged. There is RIGHT lung interstitial and airspace opacity, favored to represent asymmetric/atypical pulmonary edema. Pneumonia is considered unlikely based on the distribution. Superimposed atelectasis. Near the RIGHT costophrenic angle, there appear to be Kerley B-lines consistent with interstitial pulmonary edema.  No gross pleural effusions.  IMPRESSION: RIGHT lung interstitial and airspace opacity compatible with mild CHF and asymmetric/atypical pulmonary edema.   Electronically Signed   By: Dereck Ligas M.D.   On: 01/17/2015 11:16     ASSESSMENT AND PLAN:   * Aspiration pneumonia On IV abx. Consult speech. Consult pulmonary.  * Acute respiratory failure O2 for sats > 90% Nebs PRN  * ARF over CKD3 Start D5-NS due to hypernatremia Consult nephrology. I/Os.  * Severe sepsis Improving  * Acute encephalopathy Due to above  * Anemia of chronic disease with worsening Tried calling family numbers in computer with no response. Will transfuse once consent obtained. Will need emergent transfusion if any worsening.  * COPD Nebs. Start steroids  *  DM SSI  All the records are reviewed and case discussed with Care Management/Social Workerr. Management plans discussed with the patient, family and they are in agreement.  CODE STATUS: FULL CODE  DVT Prophylaxis: SCDs  TOTAL CRITICAL  CARE TIME TAKING CARE OF THIS PATIENT: 40 minutes.    Hillary Bow R M.D on 01/18/2015 at 11:51 AM  Between 7am to 6pm - Pager - [phone]  After 6pm go to [email or web address] - password [email or web address] [facility]  Office  [phone]  CC: Primary care physician; Lorelee Market, MD

## 2015-01-18 NOTE — Progress Notes (Signed)
Subjective:  Patient well known to [place] from last visit.  Was recently discharged. Cr upon discharge was down to 2.48. Cr now up to 3.48. Pt somnolent, not following commands.  Objective:  Vital signs in last 24 hours:  Temp:  [89.7 F (32.1 C)-98.6 F (37 C)] 97.7 F (36.5 C) (07/05 0736) Pulse Rate:  [44-84] 84 (07/05 0736) Resp:  [8-21] 15 (07/05 0736) BP: (85-149)/(49-103) 127/55 mmHg (07/05 0700) SpO2:  [88 %-100 %] 100 % (07/05 0736)  Weight change:  Filed Weights   01/17/15 1037  Weight: 90.719 kg (200 lb)    Intake/Output: I/O last 3 completed shifts: In: 300 [IV Piggyback:300] Out: 2050 [Urine:2050]     Physical Exam: General: NAD, laying in bed, lethargic  HEENT Moist mucus dry  Neck supple  Pulm/lungs Clear bilateral, normal effort  CVS/Heart Regular, no rub  Abdomen:  Soft, non tender, BS present  Extremities: 1+ peripheral edema  Neurologic: Lethargic, not following commands  Skin: No acute rashes          Basic Metabolic Panel:  Recent Labs Lab 01/17/15 1116 01/17/15 1819 01/18/15 0624  NA 141  --  146*  K 4.6  --  5.0  CL 112*  --  117*  CO2 24  --  24  GLUCOSE 197*  --  95  BUN 52*  --  47*  CREATININE 3.29* 3.31* 3.48*  CALCIUM 8.9  --  8.7*     CBC:  Recent Labs Lab 01/17/15 1116 01/17/15 1819 01/18/15 0624  WBC 5.3 4.2 5.3  NEUTROABS 3.9  --   --   HGB 7.3* 6.9* 6.6*  HCT 23.1* 21.8* 21.2*  MCV 94.5 95.2 93.9  PLT 150 169 197      Microbiology: Results for orders placed or performed during the hospital encounter of 01/17/15  Blood Culture (routine x 2)     Status: None (Preliminary result)   Collection Time: 01/17/15 10:59 AM  Result Value Ref Range Status   Specimen Description BLOOD LEFT ARM  Final   Special Requests BOTTLES DRAWN AEROBIC AND ANAEROBIC .5 CC  Final   Culture NO GROWTH < 24 HOURS  Final   Report Status PENDING  Incomplete  Blood Culture (routine x 2)     Status: None (Preliminary result)   Collection Time: 01/17/15 11:11 AM  Result Value Ref Range Status   Specimen Description BLOOD LEFT HAND  Final   Special Requests BOTTLES DRAWN AEROBIC AND ANAEROBIC  Final   Culture NO GROWTH < 24 HOURS  Final   Report Status PENDING  Incomplete  Urine culture     Status: None (Preliminary result)   Collection Time: 01/17/15  1:20 PM  Result Value Ref Range Status   Specimen Description Urine  Final   Special Requests NONE  Final   Culture NO GROWTH <24 HRS  Final   Report Status PENDING  Incomplete  Urine culture     Status: None (Preliminary result)   Collection Time: 01/17/15  5:58 PM  Result Value Ref Range Status   Specimen Description URINE, CLEAN CATCH  Final   Special Requests NONE  Final   Culture NO GROWTH <24 HRS  Final   Report Status PENDING  Incomplete  MRSA PCR Screening     Status: None   Collection Time: 01/17/15  5:59 PM  Result Value Ref Range Status   MRSA by PCR NEGATIVE NEGATIVE Final    Comment:        The  GeneXpert MRSA Assay (FDA approved for NASAL specimens only), is one component of a comprehensive MRSA colonization surveillance program. It is not intended to diagnose MRSA infection nor to guide or monitor treatment for MRSA infections.     Coagulation Studies: No results for input(s): LABPROT, INR in the last 72 hours.  Urinalysis:  Recent Labs  01/17/15 1320  COLORURINE YELLOW*  LABSPEC 1.016  PHURINE 5.0  GLUCOSEU 50*  HGBUR 1+*  BILIRUBINUR NEGATIVE  KETONESUR NEGATIVE  PROTEINUR 100*  NITRITE NEGATIVE  LEUKOCYTESUR 3+*      Imaging: Dg Chest Port 1 View  01/17/2015   CLINICAL DATA:  Dyspnea/short of breath.  EXAM: PORTABLE CHEST - 1 VIEW  COMPARISON:  01/04/2015.  FINDINGS: Cardiopericardial silhouette within normal limits for projection. Mediastinal contours are unchanged. There is RIGHT lung interstitial and airspace opacity, favored to represent asymmetric/atypical pulmonary edema. Pneumonia is considered unlikely based  on the distribution. Superimposed atelectasis. Near the RIGHT costophrenic angle, there appear to be Kerley B-lines consistent with interstitial pulmonary edema.  No gross pleural effusions.  IMPRESSION: RIGHT lung interstitial and airspace opacity compatible with mild CHF and asymmetric/atypical pulmonary edema.   Electronically Signed   By: Dereck Ligas M.D.   On: 01/17/2015 11:16     Medications:   . dextrose 5 % and 0.45% NaCl     . antiseptic oral rinse  7 mL Mouth Rinse q12n4p  . aspirin EC  81 mg Oral Daily  . chlorhexidine  15 mL Mouth Rinse BID  . cholecalciferol  1,000 Units Oral Daily  . docusate sodium  100 mg Oral BID  . dutasteride  0.5 mg Oral Daily  . heparin  5,000 Units Subcutaneous 3 times per day  . insulin aspart  0-9 Units Subcutaneous TID WC  . magnesium oxide  400 mg Oral Daily  . metoprolol  50 mg Oral BID  . piperacillin-tazobactam (ZOSYN)  IV  3.375 g Intravenous Q12H  . polyethylene glycol  17 g Oral q morning - 10a  . saccharomyces boulardii  250 mg Oral TID  . sodium chloride  3 mL Intravenous Q12H  . tamsulosin  0.4 mg Oral Daily  . [START ON 01/19/2015] vancomycin  1,000 mg Intravenous Q36H   sodium chloride, acetaminophen **OR** acetaminophen, ondansetron **OR** ondansetron (ZOFRAN) IV, sodium chloride  Assessment/ Plan:  80 y.o. male with a medical problems of peripheral vascular disease, COPD, chronic kidney disease stage III Baseline creatinine 1.5, diabetes mellitus, hypertension, anemia of chronic kidney disease, dementia, BPH, history of colon cancer, peripheral neuropathy, gastroparesis, multiple prior episodes of acute renal failure.    1. Acute renal failure/chronic kidney disease stage III Baseline creatinine 1.5/proteinuria. The patient has had prior episodes of acute renal failure. Negative spep/anca/gbm last admission, was hypothermic upon admission. Suspect that pt's acute renal failure is multifactorial now.  Was hypothermic suggesting a  sepsis etiology, UA also indicative of UTI.   Continue hydration for now, no urgent indication for HD at the moment.  Check renal US.    2. Hypernatremia. Na up to 146, continue 1/2 NS for now.  3. Anemia of chronic kidney disease. hgb low at 6.6, suggest PRBC transfusion, defer to hospitalist.  4.  UTI:  Pt currently on zosyn and should cover most urinary pathogens.  Awaiting culture data.   5.  Thanks for consult.   LOS: 1 Elen Acero 7/5/201611:50 AM

## 2015-01-18 NOTE — Progress Notes (Signed)
Pt has been on [facility] all day. Temperature is currently 98.7. Pt placed on bipap per order. Dr. Darvin Neighbours was trying to reach family about blood but there was no one available. Dr. Darvin Neighbours stated if the pts Hgb went down upon recheck he would emergently transfuse, however, Hgb went up to 6.7 from 6.6.

## 2015-01-18 NOTE — Progress Notes (Signed)
Ultrasound called and said they were unable to do a portable ultrasound unless the pt was on the vent or if the order was stat. Notified Dr. Holley Raring who said that is fine and will see if ultrasound can be done in the morning.

## 2015-01-19 ENCOUNTER — Inpatient Hospital Stay: Payer: Medicare Other

## 2015-01-19 LAB — CBC
HCT: 23 % — ABNORMAL LOW (ref 40.0–52.0)
Hemoglobin: 7.2 g/dL — ABNORMAL LOW (ref 13.0–18.0)
MCH: 29.6 pg (ref 26.0–34.0)
MCHC: 31.3 g/dL — AB (ref 32.0–36.0)
MCV: 94.4 fL (ref 80.0–100.0)
Platelets: 248 10*3/uL (ref 150–440)
RBC: 2.43 MIL/uL — ABNORMAL LOW (ref 4.40–5.90)
RDW: 21.2 % — AB (ref 11.5–14.5)
WBC: 5.6 10*3/uL (ref 3.8–10.6)

## 2015-01-19 LAB — BASIC METABOLIC PANEL
Anion gap: 4 — ABNORMAL LOW (ref 5–15)
BUN: 46 mg/dL — ABNORMAL HIGH (ref 6–20)
CALCIUM: 9.3 mg/dL (ref 8.9–10.3)
CO2: 25 mmol/L (ref 22–32)
Chloride: 118 mmol/L — ABNORMAL HIGH (ref 101–111)
Creatinine, Ser: 3.59 mg/dL — ABNORMAL HIGH (ref 0.61–1.24)
GFR calc Af Amer: 17 mL/min — ABNORMAL LOW (ref >60)
GFR, EST NON AFRICAN AMERICAN: 15 mL/min — AB (ref >60)
GLUCOSE: 138 mg/dL — AB (ref 65–99)
Potassium: 5 mmol/L (ref 3.5–5.1)
Sodium: 147 mmol/L — ABNORMAL HIGH (ref 135–145)

## 2015-01-19 LAB — URINE CULTURE
CULTURE: NO GROWTH
Culture: NO GROWTH

## 2015-01-19 LAB — BLOOD GAS, ARTERIAL
ACID-BASE DEFICIT: 0.7 mmol/L (ref 0.0–2.0)
ALLENS TEST (PASS/FAIL): POSITIVE — AB
Bicarbonate: 25.8 mEq/L (ref 21.0–28.0)
DELIVERY SYSTEMS: POSITIVE
Expiratory PAP: 6
FIO2: 0.28 %
Inspiratory PAP: 12
O2 Saturation: 82.8 %
PH ART: 7.33 — AB (ref 7.350–7.450)
Patient temperature: 37
pCO2 arterial: 49 mmHg — ABNORMAL HIGH (ref 32.0–48.0)
pO2, Arterial: 51 mmHg — ABNORMAL LOW (ref 83.0–108.0)

## 2015-01-19 LAB — VANCOMYCIN, RANDOM: Vancomycin Rm: 15 ug/mL

## 2015-01-19 LAB — GLUCOSE, CAPILLARY
GLUCOSE-CAPILLARY: 179 mg/dL — AB (ref 65–99)
Glucose-Capillary: 104 mg/dL — ABNORMAL HIGH (ref 65–99)
Glucose-Capillary: 120 mg/dL — ABNORMAL HIGH (ref 65–99)
Glucose-Capillary: 163 mg/dL — ABNORMAL HIGH (ref 65–99)
Glucose-Capillary: 200 mg/dL — ABNORMAL HIGH (ref 65–99)

## 2015-01-19 MED ORDER — BUDESONIDE 0.5 MG/2ML IN SUSP
0.5000 mg | Freq: Two times a day (BID) | RESPIRATORY_TRACT | Status: DC
  Administered 2015-01-19 – 2015-01-26 (×16): 0.5 mg via RESPIRATORY_TRACT
  Filled 2015-01-19 (×16): qty 2

## 2015-01-19 MED ORDER — HYDRALAZINE HCL 20 MG/ML IJ SOLN
10.0000 mg | Freq: Four times a day (QID) | INTRAMUSCULAR | Status: DC | PRN
  Administered 2015-01-20 – 2015-01-27 (×7): 10 mg via INTRAVENOUS
  Filled 2015-01-19 (×7): qty 1

## 2015-01-19 MED ORDER — ALBUTEROL SULFATE (2.5 MG/3ML) 0.083% IN NEBU
2.5000 mg | INHALATION_SOLUTION | RESPIRATORY_TRACT | Status: DC
  Administered 2015-01-19 – 2015-01-21 (×12): 2.5 mg via RESPIRATORY_TRACT
  Filled 2015-01-19 (×12): qty 3

## 2015-01-19 MED ORDER — DEXTROSE 5 % IV SOLN
INTRAVENOUS | Status: DC
Start: 2015-01-19 — End: 2015-01-21
  Administered 2015-01-19: 09:00:00 via INTRAVENOUS
  Administered 2015-01-20: 50 mL via INTRAVENOUS
  Administered 2015-01-20: 23:00:00 via INTRAVENOUS

## 2015-01-19 NOTE — Clinical Social Work Note (Signed)
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  CSW left a voicemail message for owner.  CSW will continue to follow and assist with d/c plans.  [place], [place]

## 2015-01-19 NOTE — Progress Notes (Signed)
ANTIBIOTIC CONSULT NOTE - FOLLOW UP  Pharmacy Consult for Vancomycin Indication: rule out sepsis  No Known Allergies  Patient Measurements: Weight: 200 lb (90.719 kg) Adjusted Body Weight: 83 kg  Vital Signs: Temp: 97.5 F (36.4 C) (07/06 0500) BP: 152/69 mmHg (07/06 0500) Pulse Rate: 83 (07/06 0500) Intake/Output from previous day: 07/05 0701 - 07/06 0700 In: 795.5 [I.V.:745.5; IV Piggyback:50] Out: 1250 [Urine:1250] Intake/Output from this shift: Total I/O In: 550 [I.V.:550] Out: -   Labs:  Recent Labs  01/17/15 1116 01/17/15 1819 01/18/15 0624 01/18/15 1525 01/19/15 0449  WBC 5.3 4.2 5.3  --  5.6  HGB 7.3* 6.9* 6.6* 6.7* 7.2*  PLT 150 169 197  --  248  CREATININE 3.29* 3.31* 3.48*  --   --    Estimated Creatinine Clearance: 19.5 mL/min (by C-G formula based on Cr of 3.48).  Recent Labs  01/19/15 0449  [facility] 15     Microbiology: Recent Results (from the past 720 hour(s))  Urine culture     Status: None   Collection Time: 01/03/15  3:20 PM  Result Value Ref Range Status   Specimen Description URINE, RANDOM  Final   Special Requests NONE  Final   Culture NO GROWTH 2 DAYS  Final   Report Status 01/05/2015 FINAL  Final  Blood Culture (routine x 2)     Status: None   Collection Time: 01/04/15 12:40 PM  Result Value Ref Range Status   Specimen Description BLOOD  Final   Special Requests NONE  Final   Culture NO GROWTH 5 DAYS  Final   Report Status 01/09/2015 FINAL  Final  Blood Culture (routine x 2)     Status: None   Collection Time: 01/04/15 12:40 PM  Result Value Ref Range Status   Specimen Description BLOOD  Final   Special Requests NONE  Final   Culture NO GROWTH 5 DAYS  Final   Report Status 01/09/2015 FINAL  Final  Urine culture     Status: None   Collection Time: 01/04/15 12:41 PM  Result Value Ref Range Status   Specimen Description URINE, RANDOM  Final   Special Requests NONE  Final   Culture NO GROWTH  Final   Report Status  01/06/2015 FINAL  Final  MRSA PCR Screening     Status: None   Collection Time: 01/04/15  5:25 PM  Result Value Ref Range Status   MRSA by PCR NEGATIVE NEGATIVE Final    Comment:        The GeneXpert MRSA Assay (FDA approved for NASAL specimens only), is one component of a comprehensive MRSA colonization surveillance program. It is not intended to diagnose MRSA infection nor to guide or monitor treatment for MRSA infections.   Blood Culture (routine x 2)     Status: None (Preliminary result)   Collection Time: 01/17/15 10:59 AM  Result Value Ref Range Status   Specimen Description BLOOD LEFT ARM  Final   Special Requests BOTTLES DRAWN AEROBIC AND ANAEROBIC .5 CC  Final   Culture NO GROWTH < 24 HOURS  Final   Report Status PENDING  Incomplete  Blood Culture (routine x 2)     Status: None (Preliminary result)   Collection Time: 01/17/15 11:11 AM  Result Value Ref Range Status   Specimen Description BLOOD LEFT HAND  Final   Special Requests BOTTLES DRAWN AEROBIC AND ANAEROBIC  Final   Culture NO GROWTH < 24 HOURS  Final   Report Status PENDING  Incomplete  Urine culture     Status: None (Preliminary result)   Collection Time: 01/17/15  1:20 PM  Result Value Ref Range Status   Specimen Description Urine  Final   Special Requests NONE  Final   Culture NO GROWTH <24 HRS  Final   Report Status PENDING  Incomplete  Urine culture     Status: None (Preliminary result)   Collection Time: 01/17/15  5:58 PM  Result Value Ref Range Status   Specimen Description URINE, CLEAN CATCH  Final   Special Requests NONE  Final   Culture NO GROWTH <24 HRS  Final   Report Status PENDING  Incomplete  MRSA PCR Screening     Status: None   Collection Time: 01/17/15  5:59 PM  Result Value Ref Range Status   MRSA by PCR NEGATIVE NEGATIVE Final    Comment:        The GeneXpert MRSA Assay (FDA approved for NASAL specimens only), is one component of a comprehensive MRSA colonization surveillance  program. It is not intended to diagnose MRSA infection nor to guide or monitor treatment for MRSA infections.     Anti-infectives    Start     Dose/Rate Route Frequency Ordered Stop   01/19/15 0700  vancomycin (VANCOCIN) IVPB 1000 mg/200 mL premix     1,000 mg 200 mL/hr over 60 Minutes Intravenous Every 36 hours 01/18/15 0907     01/18/15 1700  piperacillin-tazobactam (ZOSYN) IVPB 3.375 g     3.375 g 12.5 mL/hr over 240 Minutes Intravenous Every 12 hours 01/18/15 0848     01/17/15 1900  vancomycin (VANCOCIN) IVPB 1000 mg/200 mL premix  Status:  Discontinued     1,000 mg 200 mL/hr over 60 Minutes Intravenous Every 24 hours 01/17/15 1336 01/18/15 0907   01/17/15 1800  piperacillin-tazobactam (ZOSYN) IVPB 3.375 g  Status:  Discontinued     3.375 g 12.5 mL/hr over 240 Minutes Intravenous 3 times per day 01/17/15 1337 01/18/15 0848   01/17/15 1530  piperacillin-tazobactam (ZOSYN) IVPB 3.375 g  Status:  Discontinued     3.375 g 100 mL/hr over 30 Minutes Intravenous 3 times per day 01/17/15 1515 01/17/15 1600   01/17/15 1100  piperacillin-tazobactam (ZOSYN) IVPB 3.375 g     3.375 g 100 mL/hr over 30 Minutes Intravenous  Once 01/17/15 1050 01/17/15 1144   01/17/15 1100  vancomycin (VANCOCIN) IVPB 1000 mg/200 mL premix     1,000 mg 200 mL/hr over 60 Minutes Intravenous  Once 01/17/15 1050 01/17/15 1410      Assessment: Pharmacy consulted to dose Vancomycin and Zosyn in a 80 yo male for empiric treatment of sepsis.   Patient's serum creatinine continues to increase due to critical condition. Serum creatinine is now 3.48 mg/dl, up from 3.29 mg/dl which was used for initial dosing.   Initial PK  SCr: 3.29, est CrCl~22 mL/min, ke: 0.023, t1/2: 30.1 h, Vd: 58 L  Current PK: Ke= 0.0206; t1/2: 33.6 hours  Goal of Therapy:  Vancomycin trough level 15-20 mcg/ml  Plan:  Vancomycin level supports continuing with 1 g iv q 36 h dosing. Will follow renal function closely and check levels  and/or change dosing as indicated.   Ulice Dash D 01/19/2015,5:39 AM
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
## 2015-01-19 NOTE — Care Management (Signed)
Important Message  Patient Details  Name: Alan Mcintyre MRN: [id number] Date of Birth: [birth year]   Medicare Important Message Given:  Geralyn Flash notification given    Darius Bump Allmond 01/19/2015, 10:36 AM

## 2015-01-19 NOTE — Progress Notes (Signed)
Pt temp is 98.9, turned off warming blanket. Will continue to monitor and reassess.
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
## 2015-01-19 NOTE — Progress Notes (Signed)
[facility] [place] NAME: Alan Mcintyre    MR#:  [id number]  DATE OF BIRTH:  Dec 31, 1934  SUBJECTIVE:  CHIEF COMPLAINT:   Chief Complaint  Patient presents with  . Shortness of Breath    from nursing home with reported dyspnea   Drowzy. Admitted for AMS, hypothermia and Hypoxia. On Bipap overnight. Was in hospital recently for same with hypothermia, UTI, hypoglycemia. REVIEW OF SYSTEMS:    ROS  Unobtainable due to encephalopathy  DRUG ALLERGIES:  No Known Allergies  VITALS:  Blood pressure 149/68, pulse 69, temperature 98.1 F (36.7 C), temperature source Rectal, resp. rate 9, weight 90.719 kg (200 lb), SpO2 100 %.  PHYSICAL EXAMINATION:   Physical Exam  GENERAL:  80 y.o.-year-old patient lying in the bed with no acute distress. Pale. Looks critically ill. EYES: Pupils equal, round, reactive to light and accommodation. No scleral icterus. Extraocular muscles intact.  HEENT: Head atraumatic, normocephalic. Oropharynx and nasopharynx clear.  NECK:  Supple, no jugular venous distention. No thyroid enlargement, no tenderness.  LUNGS: Coarse BS. CARDIOVASCULAR: S1, S2 normal. No murmurs, rubs, or gallops.  ABDOMEN: Soft, nontender, nondistended. Bowel sounds present. No organomegaly or mass.  EXTREMITIES: No cyanosis, clubbing. 2 + edema LE. NEUROLOGIC: Cranial nerves II through XII are intact. No focal Motor or sensory deficits b/l.   PSYCHIATRIC: The patient is drowzy SKIN: No obvious rash, lesion, or ulcer.    LABORATORY PANEL:   CBC  Recent Labs Lab 01/19/15 0449  WBC 5.6  HGB 7.2*  HCT 23.0*  PLT 248   ------------------------------------------------------------------------------------------------------------------  Chemistries   Recent Labs Lab 01/17/15 1116  01/19/15 0449  NA 141  < > 147*  K 4.6  < > 5.0  CL 112*  < > 118*  CO2 24  < > 25  GLUCOSE 197*  < > 138*  BUN 52*  < > 46*   CREATININE 3.29*  < > 3.59*  CALCIUM 8.9  < > 9.3  AST 25  --   --   ALT 26  --   --   ALKPHOS 87  --   --   BILITOT 0.4  --   --   < > = values in this interval not displayed. ------------------------------------------------------------------------------------------------------------------  Cardiac Enzymes  Recent Labs Lab 01/17/15 1116  TROPONINI <0.03   ------------------------------------------------------------------------------------------------------------------  RADIOLOGY:  Dg Chest Port 1 View  01/17/2015   CLINICAL DATA:  Dyspnea/short of breath.  EXAM: PORTABLE CHEST - 1 VIEW  COMPARISON:  01/04/2015.  FINDINGS: Cardiopericardial silhouette within normal limits for projection. Mediastinal contours are unchanged. There is RIGHT lung interstitial and airspace opacity, favored to represent asymmetric/atypical pulmonary edema. Pneumonia is considered unlikely based on the distribution. Superimposed atelectasis. Near the RIGHT costophrenic angle, there appear to be Kerley B-lines consistent with interstitial pulmonary edema.  No gross pleural effusions.  IMPRESSION: RIGHT lung interstitial and airspace opacity compatible with mild CHF and asymmetric/atypical pulmonary edema.   Electronically Signed   By: Dereck Ligas M.D.   On: 01/17/2015 11:16     ASSESSMENT AND PLAN:   * Aspiration pneumonia On IV abx. Consult speech. Consult pulmonary.  * Acute respiratory failure O2 for sats > 90% Nebs PRN  * UTI On IV abx. Await cx.  * ARF over CKD3 Start D5-NS due to hypernatremia Consult nephrology. I/Os.  * Severe sepsis Improving  * Acute encephalopathy Due to above  * Anemia of chronic disease with worsening Tried calling  family numbers in computer with no response. Can transfuse once consent obtained. Will need emergent transfusion if any worsening. Presently stable  * Hypothermia due to sepsis On Bair hugger  * COPD Nebs. Start steroids  *  DM SSI  All the records are reviewed and case discussed with Care Management/Social Workerr. Management plans discussed with the patient, family and they are in agreement.  Discussed with Dr. Mortimer Fries. Will repeat ABG.  CODE STATUS: FULL CODE  DVT Prophylaxis: SCDs  TOTAL CRITICAL CARE TIME TAKING CARE OF THIS PATIENT: 35 minutes.    Hillary Bow R M.D on 01/19/2015 at 9:44 AM  Between 7am to 6pm - Pager - [phone]  After 6pm go to [email or web address] - password [email or web address] [facility]  Office  [phone]  CC: Primary care physician; Lorelee Market, MD
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
## 2015-01-19 NOTE — Progress Notes (Signed)
Subjective:  On Bipap, unable to answer questions. Nursing at bedside.  UOP 2250 Hgb 7.2 (6.7) Creatinine 3.58 (3.48) Na 147 (146) - on D5 1/2NS at 76mL/hr Currently on vancomycin and zosyn.  Solumedrol ordered.   Objective:  Vital signs in last 24 hours:  Temp:  [97.3 F (36.3 C)-98.6 F (37 C)] 98.1 F (36.7 C) (07/06 0700) Pulse Rate:  [72-99] 72 (07/06 0800) Resp:  [9-18] 12 (07/06 0800) BP: (110-165)/(52-88) 156/72 mmHg (07/06 0800) SpO2:  [100 %] 100 % (07/06 0800) FiO2 (%):  [28 %] 28 % (07/05 1900)  Weight change:  Filed Weights   01/17/15 1037  Weight: 90.719 kg (200 lb)    Intake/Output: I/O last 3 completed shifts: In: 1420.5 [I.V.:870.5; IV Piggyback:550] Out: [id number] [Urine:4050]     Physical Exam: General: Critically ill, on BiPaP  HEENT Moist mucus dry, eyes closed  Neck supple  Pulm/lungs Clear bilateral,   CVS/Heart Regular, no rub  Abdomen:  Soft, non tender, BS present  Extremities: 1+ peripheral and dependent edema  Neurologic: Lethargic, not following commands  Skin: No acute rashes          Basic Metabolic Panel:  Recent Labs Lab 01/17/15 1116 01/17/15 1819 01/18/15 0624 01/19/15 0449  NA 141  --  146* 147*  K 4.6  --  5.0 5.0  CL 112*  --  117* 118*  CO2 24  --  24 25  GLUCOSE 197*  --  95 138*  BUN 52*  --  47* 46*  CREATININE 3.29* 3.31* 3.48* 3.59*  CALCIUM 8.9  --  8.7* 9.3     CBC:  Recent Labs Lab 01/17/15 1116 01/17/15 1819 01/18/15 0624 01/18/15 1525 01/19/15 0449  WBC 5.3 4.2 5.3  --  5.6  NEUTROABS 3.9  --   --   --   --   HGB 7.3* 6.9* 6.6* 6.7* 7.2*  HCT 23.1* 21.8* 21.2*  --  23.0*  MCV 94.5 95.2 93.9  --  94.4  PLT 150 169 197  --  248      Microbiology: Results for orders placed or performed during the hospital encounter of 01/17/15  Blood Culture (routine x 2)     Status: None (Preliminary result)   Collection Time: 01/17/15 10:59 AM  Result Value Ref Range Status   Specimen Description  BLOOD LEFT ARM  Final   Special Requests BOTTLES DRAWN AEROBIC AND ANAEROBIC .5 CC  Final   Culture NO GROWTH < 24 HOURS  Final   Report Status PENDING  Incomplete  Blood Culture (routine x 2)     Status: None (Preliminary result)   Collection Time: 01/17/15 11:11 AM  Result Value Ref Range Status   Specimen Description BLOOD LEFT HAND  Final   Special Requests BOTTLES DRAWN AEROBIC AND ANAEROBIC  Final   Culture NO GROWTH < 24 HOURS  Final   Report Status PENDING  Incomplete  Urine culture     Status: None (Preliminary result)   Collection Time: 01/17/15  1:20 PM  Result Value Ref Range Status   Specimen Description Urine  Final   Special Requests NONE  Final   Culture NO GROWTH <24 HRS  Final   Report Status PENDING  Incomplete  Urine culture     Status: None (Preliminary result)   Collection Time: 01/17/15  5:58 PM  Result Value Ref Range Status   Specimen Description URINE, CLEAN CATCH  Final   Special Requests NONE  Final   Culture  NO GROWTH <24 HRS  Final   Report Status PENDING  Incomplete  MRSA PCR Screening     Status: None   Collection Time: 01/17/15  5:59 PM  Result Value Ref Range Status   MRSA by PCR NEGATIVE NEGATIVE Final    Comment:        The GeneXpert MRSA Assay (FDA approved for NASAL specimens only), is one component of a comprehensive MRSA colonization surveillance program. It is not intended to diagnose MRSA infection nor to guide or monitor treatment for MRSA infections.     Coagulation Studies: No results for input(s): LABPROT, INR in the last 72 hours.  Urinalysis:  Recent Labs  01/17/15 1320  COLORURINE YELLOW*  LABSPEC 1.016  PHURINE 5.0  GLUCOSEU 50*  HGBUR 1+*  BILIRUBINUR NEGATIVE  KETONESUR NEGATIVE  PROTEINUR 100*  NITRITE NEGATIVE  LEUKOCYTESUR 3+*      Imaging: Dg Chest Port 1 View  01/17/2015   CLINICAL DATA:  Dyspnea/short of breath.  EXAM: PORTABLE CHEST - 1 VIEW  COMPARISON:  01/04/2015.  FINDINGS: Cardiopericardial  silhouette within normal limits for projection. Mediastinal contours are unchanged. There is RIGHT lung interstitial and airspace opacity, favored to represent asymmetric/atypical pulmonary edema. Pneumonia is considered unlikely based on the distribution. Superimposed atelectasis. Near the RIGHT costophrenic angle, there appear to be Kerley B-lines consistent with interstitial pulmonary edema.  No gross pleural effusions.  IMPRESSION: RIGHT lung interstitial and airspace opacity compatible with mild CHF and asymmetric/atypical pulmonary edema.   Electronically Signed   By: Dereck Ligas M.D.   On: 01/17/2015 11:16     Medications:   . dextrose 5 % and 0.45% NaCl 50 mL/hr at 01/18/15 2000   . antiseptic oral rinse  7 mL Mouth Rinse q12n4p  . aspirin EC  81 mg Oral Daily  . chlorhexidine  15 mL Mouth Rinse BID  . cholecalciferol  1,000 Units Oral Daily  . docusate sodium  100 mg Oral BID  . dutasteride  0.5 mg Oral Daily  . heparin  5,000 Units Subcutaneous 3 times per day  . insulin aspart  0-9 Units Subcutaneous TID WC  . magnesium oxide  400 mg Oral Daily  . methylPREDNISolone (SOLU-MEDROL) injection  60 mg Intravenous Q24H  . metoprolol  50 mg Oral BID  . piperacillin-tazobactam (ZOSYN)  IV  3.375 g Intravenous Q12H  . polyethylene glycol  17 g Oral q morning - 10a  . saccharomyces boulardii  250 mg Oral TID  . sodium chloride  3 mL Intravenous Q12H  . tamsulosin  0.4 mg Oral Daily  . vancomycin  1,000 mg Intravenous Q36H   sodium chloride, acetaminophen **OR** acetaminophen, ondansetron **OR** ondansetron (ZOFRAN) IV, sodium chloride  Assessment/ Plan:  80 y.o. Black male with peripheral vascular disease, COPD, chronic kidney disease stage III Baseline creatinine 1.5, diabetes mellitus, hypertension, anemia of chronic kidney disease, dementia, BPH, history of colon cancer, peripheral neuropathy, gastroparesis, multiple prior episodes of acute renal failure.    1. Acute renal  failure/chronic kidney disease stage III Baseline creatinine 1.5/proteinuria. The patient has had prior episodes of acute renal failure. Negative spep/anca/gbm last admission, was hypothermic upon admission. Suspect that pt's acute renal failure is multifactorial now.  Was hypothermic suggesting a sepsis etiology, UA also indicative of UTI.   Continue hydration for now, will change to D5W at 56mL/hr and monitor volume status.  Renal ultrasound unable to be performed currently.  No acute indication for dialysis currently. Renally dose all medications.  2. Hypernatremia. Na 147 - as above, change to D5W at 6mL/hr.  - recheck sodium later today.   3. Anemia of chronic kidney disease: hemoglobin improved. Low threshold for transfusion  4. Sepsis with urinary tract infection:  Currently not requiring vasopressors. Emperic antibiotics Cultures pending.   LOS: [place], Larson Limones 7/6/20168:18 AM

## 2015-01-20 ENCOUNTER — Inpatient Hospital Stay: Payer: Medicare Other

## 2015-01-20 LAB — SODIUM
Sodium: 149 mmol/L — ABNORMAL HIGH (ref 135–145)
Sodium: 143 mmol/L (ref 135–145)

## 2015-01-20 LAB — BASIC METABOLIC PANEL
Anion gap: 5 (ref 5–15)
BUN: 39 mg/dL — AB (ref 6–20)
CHLORIDE: 117 mmol/L — AB (ref 101–111)
CO2: 26 mmol/L (ref 22–32)
Calcium: 9.4 mg/dL (ref 8.9–10.3)
Creatinine, Ser: 3.26 mg/dL — ABNORMAL HIGH (ref 0.61–1.24)
GFR calc non Af Amer: 17 mL/min — ABNORMAL LOW (ref >60)
GFR, EST AFRICAN AMERICAN: 20 mL/min — AB (ref >60)
GLUCOSE: 108 mg/dL — AB (ref 65–99)
POTASSIUM: 4.5 mmol/L (ref 3.5–5.1)
Sodium: 148 mmol/L — ABNORMAL HIGH (ref 135–145)

## 2015-01-20 LAB — CBC
HCT: 21 % — ABNORMAL LOW (ref 40.0–52.0)
HEMOGLOBIN: 6.7 g/dL — AB (ref 13.0–18.0)
MCH: 29.2 pg (ref 26.0–34.0)
MCHC: 31.7 g/dL — ABNORMAL LOW (ref 32.0–36.0)
MCV: 92 fL (ref 80.0–100.0)
Platelets: 239 10*3/uL (ref 150–440)
RBC: 2.28 MIL/uL — AB (ref 4.40–5.90)
RDW: 20 % — ABNORMAL HIGH (ref 11.5–14.5)
WBC: 6.9 10*3/uL (ref 3.8–10.6)

## 2015-01-20 LAB — GLUCOSE, CAPILLARY
GLUCOSE-CAPILLARY: 255 mg/dL — AB (ref 65–99)
Glucose-Capillary: 111 mg/dL — ABNORMAL HIGH (ref 65–99)
Glucose-Capillary: 120 mg/dL — ABNORMAL HIGH (ref 65–99)
Glucose-Capillary: 300 mg/dL — ABNORMAL HIGH (ref 65–99)

## 2015-01-20 LAB — PHOSPHORUS: PHOSPHORUS: 3.5 mg/dL (ref 2.5–4.6)

## 2015-01-20 LAB — MAGNESIUM: MAGNESIUM: 2.4 mg/dL (ref 1.7–2.4)

## 2015-01-20 NOTE — Progress Notes (Signed)
Pt has had periods of apnea. Notified Dr. Margaretmary Eddy during rounds. She stated she believed this was due to sleep apnea. Will continue to reassess.

## 2015-01-20 NOTE — Care Management Note (Signed)
Case Management Note  Patient Details  Name: Alan Mcintyre MRN: [id number] Date of Birth: July 21, 1934  Subjective/Objective:     Sister's name is Carmelina Paddock and her contact information is [phone]               Action/Plan:   Expected Discharge Date:                  Expected Discharge Plan:  Skilled Nursing Facility  In-House Referral:  Clinical Social Work  Discharge planning Services     Post Acute Care Choice:    Choice offered to:     DME Arranged:    DME Agency:     HH Arranged:    [place] Agency:     Status of Service:     Medicare Important Message Given:  Yes-fourth notification given Date Medicare IM Given:    Medicare IM give by:    Date Additional Medicare IM Given:    Additional Medicare Important Message give by:     If discussed at [place] of Stay Meetings, dates discussed:    Additional Comments:  Jolly Mango, RN 01/20/2015, 2:58 PM

## 2015-01-20 NOTE — Progress Notes (Addendum)
ANTIBIOTIC CONSULT NOTE - FOLLOW UP  Pharmacy Consult for Vancomycin and Zosyn  Indication: rule out sepsis  No Known Allergies  Patient Measurements: Weight: 200 lb (90.719 kg) Adjusted Body Weight: 83 kg  Vital Signs: Temp: 97.5 F (36.4 C) (07/07 0700) Temp Source: Rectal (07/07 0600) BP: 173/83 mmHg (07/07 0800) Pulse Rate: 86 (07/07 0800) Intake/Output from previous day: 07/06 0701 - 07/07 0700 In: 1158.3 [I.V.:1058.3; IV Piggyback:100] Out: 1800 [Urine:1800] Intake/Output from this shift: Total I/O In: 76.7 [I.V.:76.7] Out: -   Labs:  Recent Labs  01/17/15 1819 01/18/15 0624 01/18/15 1525 01/19/15 0449 01/20/15 0645  WBC 4.2 5.3  --  5.6  --   HGB 6.9* 6.6* 6.7* 7.2*  --   PLT 169 197  --  248  --   CREATININE 3.31* 3.48*  --  3.59* 3.26*   Estimated Creatinine Clearance: 20.8 mL/min (by C-G formula based on Cr of 3.26).  Recent Labs  01/19/15 0449  [facility] 15     Microbiology: Recent Results (from the past 720 hour(s))  Urine culture     Status: None   Collection Time: 01/03/15  3:20 PM  Result Value Ref Range Status   Specimen Description URINE, RANDOM  Final   Special Requests NONE  Final   Culture NO GROWTH 2 DAYS  Final   Report Status 01/05/2015 FINAL  Final  Blood Culture (routine x 2)     Status: None   Collection Time: 01/04/15 12:40 PM  Result Value Ref Range Status   Specimen Description BLOOD  Final   Special Requests NONE  Final   Culture NO GROWTH 5 DAYS  Final   Report Status 01/09/2015 FINAL  Final  Blood Culture (routine x 2)     Status: None   Collection Time: 01/04/15 12:40 PM  Result Value Ref Range Status   Specimen Description BLOOD  Final   Special Requests NONE  Final   Culture NO GROWTH 5 DAYS  Final   Report Status 01/09/2015 FINAL  Final  Urine culture     Status: None   Collection Time: 01/04/15 12:41 PM  Result Value Ref Range Status   Specimen Description URINE, RANDOM  Final   Special Requests NONE   Final   Culture NO GROWTH  Final   Report Status 01/06/2015 FINAL  Final  MRSA PCR Screening     Status: None   Collection Time: 01/04/15  5:25 PM  Result Value Ref Range Status   MRSA by PCR NEGATIVE NEGATIVE Final    Comment:        The GeneXpert MRSA Assay (FDA approved for NASAL specimens only), is one component of a comprehensive MRSA colonization surveillance program. It is not intended to diagnose MRSA infection nor to guide or monitor treatment for MRSA infections.   Blood Culture (routine x 2)     Status: None (Preliminary result)   Collection Time: 01/17/15 10:59 AM  Result Value Ref Range Status   Specimen Description BLOOD LEFT ARM  Final   Special Requests BOTTLES DRAWN AEROBIC AND ANAEROBIC .5 CC  Final   Culture NO GROWTH < 24 HOURS  Final   Report Status PENDING  Incomplete  Blood Culture (routine x 2)     Status: None (Preliminary result)   Collection Time: 01/17/15 11:11 AM  Result Value Ref Range Status   Specimen Description BLOOD LEFT HAND  Final   Special Requests BOTTLES DRAWN AEROBIC AND ANAEROBIC  Final   Culture NO GROWTH <  24 HOURS  Final   Report Status PENDING  Incomplete  Urine culture     Status: None   Collection Time: 01/17/15  1:20 PM  Result Value Ref Range Status   Specimen Description Urine  Final   Special Requests NONE  Final   Culture NO GROWTH 1 DAY  Final   Report Status 01/19/2015 FINAL  Final  Urine culture     Status: None   Collection Time: 01/17/15  5:58 PM  Result Value Ref Range Status   Specimen Description URINE, CLEAN CATCH  Final   Special Requests NONE  Final   Culture NO GROWTH 1 DAY  Final   Report Status 01/19/2015 FINAL  Final  MRSA PCR Screening     Status: None   Collection Time: 01/17/15  5:59 PM  Result Value Ref Range Status   MRSA by PCR NEGATIVE NEGATIVE Final    Comment:        The GeneXpert MRSA Assay (FDA approved for NASAL specimens only), is one component of a comprehensive MRSA  colonization surveillance program. It is not intended to diagnose MRSA infection nor to guide or monitor treatment for MRSA infections.     Anti-infectives    Start     Dose/Rate Route Frequency Ordered Stop   01/19/15 0700  vancomycin (VANCOCIN) IVPB 1000 mg/200 mL premix     1,000 mg 200 mL/hr over 60 Minutes Intravenous Every 36 hours 01/18/15 0907     01/18/15 1700  piperacillin-tazobactam (ZOSYN) IVPB 3.375 g     3.375 g 12.5 mL/hr over 240 Minutes Intravenous Every 12 hours 01/18/15 0848     01/17/15 1900  vancomycin (VANCOCIN) IVPB 1000 mg/200 mL premix  Status:  Discontinued     1,000 mg 200 mL/hr over 60 Minutes Intravenous Every 24 hours 01/17/15 1336 01/18/15 0907   01/17/15 1800  piperacillin-tazobactam (ZOSYN) IVPB 3.375 g  Status:  Discontinued     3.375 g 12.5 mL/hr over 240 Minutes Intravenous 3 times per day 01/17/15 1337 01/18/15 0848   01/17/15 1530  piperacillin-tazobactam (ZOSYN) IVPB 3.375 g  Status:  Discontinued     3.375 g 100 mL/hr over 30 Minutes Intravenous 3 times per day 01/17/15 1515 01/17/15 1600   01/17/15 1100  piperacillin-tazobactam (ZOSYN) IVPB 3.375 g     3.375 g 100 mL/hr over 30 Minutes Intravenous  Once 01/17/15 1050 01/17/15 1144   01/17/15 1100  vancomycin (VANCOCIN) IVPB 1000 mg/200 mL premix     1,000 mg 200 mL/hr over 60 Minutes Intravenous  Once 01/17/15 1050 01/17/15 1410      Assessment: Pharmacy consulted to dose Vancomycin and Zosyn in a 80 yo male for empiric treatment of sepsis.   Patient serum creatinine continues to improve currently 3.26.   Goal of Therapy:  Vancomycin trough level 15-20 mcg/ml  Plan:  Will continue current regimen of Vancomycin 1 g IV q36 hours. Will order Vancomycin level prior to the 07:00 dose on 7/9. Level will not be @ [facility].  Continue to follow renal function closely. Patient is borderline for q8 hour Zosyn dosing. Will continue q12 hour dosing for now and reevaluate upon serum creatinine  results in am.   Haruki Arnold D 01/20/2015,8:46 AM

## 2015-01-20 NOTE — Progress Notes (Signed)
Speech Language Pathology Treatment: Dysphagia  Patient Details Name: Alan Mcintyre MRN: [id number] DOB: [birth year] Today's Date: 01/20/2015 Time: [id number] SLP Time Calculation (min) (ACUTE ONLY): 60 min  Assessment / Plan / Recommendation Clinical Impression  Pt appeared to tolerate trials of puree and Nectar consistency liquids via tsp and straw following general aspiration precautions. Pt was given trials of ice chips(4), tsps of thin liquids(2), several tsps of puree, and ~4 ozs of Nectar liquid via tsp/straw. Pt tolerated trials w/ no overt s/s of aspiration except w/ 1/2 tsp trials of thin liquids - suspect this was related to pt's increased oral phase time w/ puree boluses and his decreased alertness and attention to task. Verbal cues were given during the trials. Also noted during po trials intermittently was irregularity in respiratory pattern c/b apnea moments and expiratory wheeze. Given time b/t trials and rest breaks, no complications from this were noted - no decline in respiratory status and O2 sats remained 99%, HR 111(NSG made aware). Due to his declined Cognitive attention to task and irregularity of respirations post po trials intermittently, rec. a Dys. 1 w/ nectar liquids diet; strict aspiration precautions including rest breaks during feeding at meals; meds in puree. NSG consulted.    HPI Other Pertinent Information: pt is a 80 y.o. male with a known history of chronic kidney disease, peripheral vascular disease, COPD, diabetes, hypertension who was recently hospitalized from June 21 of June 26 with weakness at that time he was diagnosed with a urinary tract infection and sepsis he was discharged to the skilled nursing facility. Patient sent from the nursing facility due to decrease in responsiveness shortness of breath. Patient in the ER was noted to be very hypothermic. He was started on a Retail banker. He also had a chest x-ray was suggested of congestive heart failure. His ABG  also showed he was hypoxic. Patient is currently lethargic; has required BiPAP for respiratory support sec. to hypercapnia. Pt has a baseline of Dementia. He is NPOafter evaluation yesterday and poor follow through for safe, oral intake. Pt has verbalized w/ NSG 1-2x. He appeared warm, NSG addressing temp.      Pertinent Vitals Pain Assessment: No/denies pain  SLP Plan  Continue with current plan of care    Recommendations Diet recommendations: Dysphagia 1 (puree);Nectar-thick liquid Liquids provided via: Cup;Straw Medication Administration: Crushed with puree Supervision: Trained caregiver to feed patient;Full supervision/cueing for compensatory strategies Compensations: Small sips/bites;Slow rate;Minimize environmental distractions Postural Changes and/or Swallow Maneuvers: Seated upright 90 degrees;Upright 30-60 min after meal              General recommendations:  (Dietician) Oral Care Recommendations: Oral care BID;Oral care before and after PO;Staff/trained caregiver to provide oral care Follow up Recommendations: Skilled Nursing facility Plan: Continue with current plan of care    GO     Trinda Harlacher 01/20/2015, 3:07 PM
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
## 2015-01-20 NOTE — Care Management Note (Signed)
Case Management Note  Patient Details  Name: Alan Mcintyre MRN: [id number] Date of Birth: July 29, 1934  Subjective/Objective:  TC to [facility], spoke with Jana Half.   At baseline patient bed to wheelchair with a hoyer lift. Patient is not ambulatory  He is normally alert and oriented. Dependent with adls. He can feed himself. Staff administers medications, provides meals and adl care.                 Action/Plan:   Expected Discharge Date:                  Expected Discharge Plan:  Skilled Nursing Facility  In-House Referral:  Clinical Social Work  Discharge planning Services     Post Acute Care Choice:    Choice offered to:     DME Arranged:    DME Agency:     HH Arranged:    [place] Agency:     Status of Service:     Medicare Important Message Given:  Yes-fourth notification given Date Medicare IM Given:    Medicare IM give by:    Date Additional Medicare IM Given:    Additional Medicare Important Message give by:     If discussed at [place] of Stay Meetings, dates discussed:    Additional Comments:  Jolly Mango, RN 01/20/2015, 8:32 AM

## 2015-01-20 NOTE — Progress Notes (Signed)
Subjective:  Remains on warming blanket.  Good UOP noted.  Na high at 149. No new Cr this AM.  Objective:  Vital signs in last 24 hours:  Temp:  [96.4 F (35.8 C)-99.5 F (37.5 C)] 97.5 F (36.4 C) (07/07 0700) Pulse Rate:  [62-98] 86 (07/07 0800) Resp:  [9-14] 12 (07/07 0800) BP: (140-185)/(60-87) 173/83 mmHg (07/07 0800) SpO2:  [96 %-100 %] 100 % (07/07 0800) FiO2 (%):  [21 %] 21 % (07/06 1305)  Weight change:  Filed Weights   01/17/15 1037  Weight: 90.719 kg (200 lb)    Intake/Output: I/O last 3 completed shifts: In: 2083.3 [I.V.:1733.3; IV Piggyback:350] Out: 2800 [Urine:2800]     Physical Exam: General: Critically ill appearing, warming blanket on  HEENT Moist mucus dry, eyes closed  Neck supple  Pulm/lungs Clear bilateral, normal effort  CVS/Heart Regular, no rub  Abdomen:  Soft, non tender, BS present  Extremities: 1+ peripheral and dependent edema  Neurologic: Lethargic, not following commands  Skin: No acute rashes          Basic Metabolic Panel:  Recent Labs Lab 01/17/15 1116 01/17/15 1819 01/18/15 0624 01/19/15 0449 01/20/15 0645 01/20/15 0903  NA 141  --  146* 147* 148* 149*  K 4.6  --  5.0 5.0 4.5  --   CL 112*  --  117* 118* 117*  --   CO2 24  --  24 25 26   --   GLUCOSE 197*  --  95 138* 108*  --   BUN 52*  --  47* 46* 39*  --   CREATININE 3.29* 3.31* 3.48* 3.59* 3.26*  --   CALCIUM 8.9  --  8.7* 9.3 9.4  --   MG  --   --   --   --  2.4  --   PHOS  --   --   --   --  3.5  --      CBC:  Recent Labs Lab 01/17/15 1116 01/17/15 1819 01/18/15 0624 01/18/15 1525 01/19/15 0449 01/20/15 0903  WBC 5.3 4.2 5.3  --  5.6 6.9  NEUTROABS 3.9  --   --   --   --   --   HGB 7.3* 6.9* 6.6* 6.7* 7.2* 6.7*  HCT 23.1* 21.8* 21.2*  --  23.0* 21.0*  MCV 94.5 95.2 93.9  --  94.4 92.0  PLT 150 169 197  --  248 239      Microbiology: Results for orders placed or performed during the hospital encounter of 01/17/15  Blood Culture (routine  x 2)     Status: None (Preliminary result)   Collection Time: 01/17/15 10:59 AM  Result Value Ref Range Status   Specimen Description BLOOD LEFT ARM  Final   Special Requests BOTTLES DRAWN AEROBIC AND ANAEROBIC .5 CC  Final   Culture NO GROWTH 3 DAYS  Final   Report Status PENDING  Incomplete  Blood Culture (routine x 2)     Status: None (Preliminary result)   Collection Time: 01/17/15 11:11 AM  Result Value Ref Range Status   Specimen Description BLOOD LEFT HAND  Final   Special Requests BOTTLES DRAWN AEROBIC AND ANAEROBIC  Final   Culture NO GROWTH 3 DAYS  Final   Report Status PENDING  Incomplete  Urine culture     Status: None   Collection Time: 01/17/15  1:20 PM  Result Value Ref Range Status   Specimen Description Urine  Final   Special Requests NONE  Final   Culture NO GROWTH 1 DAY  Final   Report Status 01/19/2015 FINAL  Final  Urine culture     Status: None   Collection Time: 01/17/15  5:58 PM  Result Value Ref Range Status   Specimen Description URINE, CLEAN CATCH  Final   Special Requests NONE  Final   Culture NO GROWTH 1 DAY  Final   Report Status 01/19/2015 FINAL  Final  MRSA PCR Screening     Status: None   Collection Time: 01/17/15  5:59 PM  Result Value Ref Range Status   MRSA by PCR NEGATIVE NEGATIVE Final    Comment:        The GeneXpert MRSA Assay (FDA approved for NASAL specimens only), is one component of a comprehensive MRSA colonization surveillance program. It is not intended to diagnose MRSA infection nor to guide or monitor treatment for MRSA infections.     Coagulation Studies: No results for input(s): LABPROT, INR in the last 72 hours.  Urinalysis:  Recent Labs  01/17/15 1320  COLORURINE YELLOW*  LABSPEC 1.016  PHURINE 5.0  GLUCOSEU 50*  HGBUR 1+*  BILIRUBINUR NEGATIVE  KETONESUR NEGATIVE  PROTEINUR 100*  NITRITE NEGATIVE  LEUKOCYTESUR 3+*      Imaging: Ct Head Wo Contrast  01/20/2015   CLINICAL DATA:  Sepsis, acute  encephalopathy, difficulty speaking, history COPD, chronic kidney disease, hypertension, diabetes mellitus  EXAM: CT HEAD WITHOUT CONTRAST  TECHNIQUE: Contiguous axial images were obtained from the base of the skull through the vertex without intravenous contrast.  COMPARISON:  01/04/2015  FINDINGS: Beam hardening artifacts from metallic foreign bodies at the posterior RIGHT parietal region.  Generalized atrophy.  Normal ventricular morphology.  No midline shift or mass effect.  Small vessel chronic ischemic changes of deep cerebral white matter.  No intracranial hemorrhage, mass lesion, or evidence acute infarction.  No extra-axial fluid collections.  Sinuses clear and bones unremarkable.  IMPRESSION: Atrophy with small vessel chronic ischemic changes of deep cerebral white matter.  No acute intracranial abnormalities.   Electronically Signed   By: Lavonia Dana M.D.   On: 01/20/2015 09:39     Medications:   . dextrose 50 mL (01/20/15 0717)   . albuterol  2.5 mg Nebulization Q4H  . antiseptic oral rinse  7 mL Mouth Rinse q12n4p  . aspirin EC  81 mg Oral Daily  . budesonide (PULMICORT) nebulizer solution  0.5 mg Nebulization BID  . chlorhexidine  15 mL Mouth Rinse BID  . cholecalciferol  1,000 Units Oral Daily  . docusate sodium  100 mg Oral BID  . dutasteride  0.5 mg Oral Daily  . heparin  5,000 Units Subcutaneous 3 times per day  . insulin aspart  0-9 Units Subcutaneous TID WC  . magnesium oxide  400 mg Oral Daily  . methylPREDNISolone (SOLU-MEDROL) injection  60 mg Intravenous Q24H  . metoprolol  50 mg Oral BID  . piperacillin-tazobactam (ZOSYN)  IV  3.375 g Intravenous Q12H  . polyethylene glycol  17 g Oral q morning - 10a  . saccharomyces boulardii  250 mg Oral TID  . sodium chloride  3 mL Intravenous Q12H  . tamsulosin  0.4 mg Oral Daily  . vancomycin  1,000 mg Intravenous Q36H   sodium chloride, acetaminophen **OR** acetaminophen, hydrALAZINE, ondansetron **OR** ondansetron (ZOFRAN)  IV, sodium chloride  Assessment/ Plan:  80 y.o. Black male with peripheral vascular disease, COPD, chronic kidney disease stage III Baseline creatinine 1.5, diabetes mellitus, hypertension, anemia of chronic  kidney disease, dementia, BPH, history of colon cancer, peripheral neuropathy, gastroparesis, multiple prior episodes of acute renal failure.    1. Acute renal failure/chronic kidney disease stage III Baseline creatinine 1.5/proteinuria. The patient has had prior episodes of acute renal failure. Negative spep/anca/gbm last admission, was hypothermic upon admission. Suspect that pt's acute renal failure is multifactorial now.  Was hypothermic suggesting a sepsis etiology, UA also indicative of UTI.   - Renal US can't be performed until pt off warming blanket.  However good UOP noted thus far.  No new Cr this AM.  Continue hydration,no indication for HD.  2. Hypernatremia. Na up to 149, increase D5W to 100cc/hr.  3. Anemia of chronic kidney disease: hemoglobin down to 6.7, would recommend PRBC transfusion, defer to hospitalist.  4. Sepsis with urinary tract infection: pt continued on zosyn and vancomycin.  LOS: 3 Rande Dario 7/7/201610:47 AM

## 2015-01-20 NOTE — Progress Notes (Signed)
Alan Mcintyre at [facility] NAME: Alan Mcintyre    MR#:  [id number]  DATE OF BIRTH:  [birth year]  SUBJECTIVE:  CHIEF COMPLAINT:   Chief Complaint  Patient presents with  . Shortness of Breath    from nursing home with reported dyspnea   Admitted for AMS, hypothermia and Hypoxia. On Bipap overnight, still on Bair hugger More awake and alert. Speech is not clear. Patient has chronic dementia, according to the facility staff patient's speech is clear as reported by the RN. Was in hospital recently for same with hypothermia, UTI, hypoglycemia. REVIEW OF SYSTEMS:    ROS  Unobtainable due to dementia  DRUG ALLERGIES:  No Known Allergies  VITALS:  Blood pressure 173/83, pulse 86, temperature 97.5 F (36.4 C), temperature source Rectal, resp. rate 12, weight 90.719 kg (200 lb), SpO2 100 %.  PHYSICAL EXAMINATION:   Physical Exam  GENERAL:  80 y.o.-year-old patient lying in the bed with no acute distress. Pale EYES: Pupils equal, round, reactive to light and accommodation. No scleral icterus. Extraocular muscles intact.  HEENT: Head atraumatic, normocephalic. Oropharynx and nasopharynx clear.  NECK:  Supple, no jugular venous distention. No thyroid enlargement, no tenderness.  LUNGS: Coarse BS. CARDIOVASCULAR: S1, S2 normal. No murmurs, rubs, or gallops.  ABDOMEN: Soft, nontender, nondistended. Bowel sounds present. No organomegaly or mass.  EXTREMITIES: No cyanosis, clubbing. 2 + edema LE. NEUROLOGIC: Cranial nerves II through XII are intact. No focal Motor or sensory deficits b/l.   PSYCHIATRIC: The patient is more alert SKIN: No obvious rash, lesion, or ulcer.    LABORATORY PANEL:   CBC  Recent Labs Lab 01/19/15 0449  WBC 5.6  HGB 7.2*  HCT 23.0*  PLT 248   ------------------------------------------------------------------------------------------------------------------  Chemistries   Recent Labs Lab 01/17/15 1116   01/20/15 0645  NA 141  < > 148*  K 4.6  < > 4.5  CL 112*  < > 117*  CO2 24  < > 26  GLUCOSE 197*  < > 108*  BUN 52*  < > 39*  CREATININE 3.29*  < > 3.26*  CALCIUM 8.9  < > 9.4  MG  --   --  2.4  AST 25  --   --   ALT 26  --   --   ALKPHOS 87  --   --   BILITOT 0.4  --   --   < > = values in this interval not displayed. ------------------------------------------------------------------------------------------------------------------  Cardiac Enzymes  Recent Labs Lab 01/17/15 1116  TROPONINI <0.03   ------------------------------------------------------------------------------------------------------------------  RADIOLOGY:  No results found.   ASSESSMENT AND PLAN:   * Aspiration pneumonia On IV abx. Consult speech, recommends nothing by mouth for now. We will reassess today Follow up with pulmonary. BiPAP when necessary  * Acute respiratory failure O2 for sats > 90% Nebs PRN IV Solu-Medrol  * UTI On IV abx. Urine culture negative so far  * ARF over CKD3 Start D5-NS due to hypernatremia, follow serial sodiums Follow-up with nephrology. Renal ultrasound is pending I/Os.  * Severe sepsis Improving Blood cultures are negative so far  * Acute encephalopathy Clinically better  *Dysarthria Will obtain CT head, no past medical history of strokes, but recent CT head with remote infarcts   neurology consult is placed  *  Anemia of chronic disease with worsening Tried calling family numbers in computer with no response. Can transfuse once consent obtained. Will need emergent transfusion if any worsening. Presently  stable  * Hypothermia due to sepsis On Bair hugger  * COPD Nebs. Start steroids  * DM SSI  * Palliative care consult is placed  All the records are reviewed and case discussed with Care Management/Social Workerr. Management plans discussed with the patient, family and they are in agreement.  Discussed with Dr. Mortimer Fries. Will repeat  ABG.  CODE STATUS: FULL CODE  DVT Prophylaxis: SCDs  TOTAL CRITICAL CARE TIME TAKING CARE OF THIS PATIENT: 35 minutes.    Nicholes Mango M.D on 01/20/2015 at 8:37 AM  Between 7am to 6pm - Pager - [phone]  After 6pm go to [email or web address] - password [email or web address] [facility]  Office  [phone]  CC: Primary care physician; Lorelee Market, MD
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
## 2015-01-20 NOTE — Clinical Social Work Note (Signed)
Clinical Social Work Assessment (Addendum)  Patient Details  Name: Alan Mcintyre MRN: [id number] Date of Birth: 1935-04-03  Date of referral: 01/19/15   Reason for consult: Facility Placement     Permission sought to share information with:   Permission granted to share information::   Name::   Agency::   Relationship::   Contact Information:    Housing/Transportation Living arrangements for the past 2 months: [place] of Information: Other (Comment Required), Medical Team (Owner of [facility]: Alan Mcintyre) Patient Interpreter Needed: None Criminal Activity/Legal Involvement Pertinent to Current Situation/Hospitalization: No - Comment as needed Significant Relationships:  (unknown) Lives with: Facility Resident Do you feel safe going back to the place where you live?   Need for family participation in patient care:    Care giving concerns: Patient resides at [facility] owned by Alan Mcintyre: [phone]   Social Worker assessment / plan:Pt is currently disoriented.  CSW called Alan Mcintyre regarding patient and she stated that she would take patient back at discharge and that he has been a resident at her home for 8 years. She expressed no concerns and inquired as to how he was progressing.   Alan Mcintyre explained that pt does not have a guardian, has limited family involvement and the facility has primarily taken care of him since he has been residing there.  Alan Mcintyre explained that pt has a daughter that lives in [place] that has been visiting during the last couple of months.  CSW has placed Fl2 on chart and will continue to follow to monitor patient's progress.   Employment status:   Insurance information: Medicare PT Recommendations: Not assessed at this time Information / Referral to community resources:    Patient/Family's Response to care: Patient sleeping  soundly.  Patient/Family's Understanding of and Emotional Response to Diagnosis, Current Treatment, and Prognosis: Alan Mcintyre expressed understanding and appreciation for CSW phone call.  Emotional Assessment Appearance: Appears stated age Attitude/Demeanor/Rapport:   Affect (typically observed):   Orientation:   Alcohol / Substance use: Not Applicable Psych involvement (Current and /or in the community): No (Comment)  Discharge Needs  Concerns to be addressed: Care Coordination Readmission within the last 30 days: No Current discharge risk: None Barriers to Discharge: No Barriers Identified  [place], [place]

## 2015-01-21 DIAGNOSIS — G9340 Encephalopathy, unspecified: Secondary | ICD-10-CM

## 2015-01-21 DIAGNOSIS — R471 Dysarthria and anarthria: Secondary | ICD-10-CM

## 2015-01-21 LAB — BASIC METABOLIC PANEL
Anion gap: 7 (ref 5–15)
BUN: 38 mg/dL — ABNORMAL HIGH (ref 6–20)
CALCIUM: 9.4 mg/dL (ref 8.9–10.3)
CO2: 26 mmol/L (ref 22–32)
Chloride: 114 mmol/L — ABNORMAL HIGH (ref 101–111)
Creatinine, Ser: 2.8 mg/dL — ABNORMAL HIGH (ref 0.61–1.24)
GFR calc Af Amer: 24 mL/min — ABNORMAL LOW (ref >60)
GFR calc non Af Amer: 20 mL/min — ABNORMAL LOW (ref >60)
Glucose, Bld: 103 mg/dL — ABNORMAL HIGH (ref 65–99)
POTASSIUM: 4.4 mmol/L (ref 3.5–5.1)
SODIUM: 147 mmol/L — AB (ref 135–145)

## 2015-01-21 LAB — GLUCOSE, CAPILLARY
GLUCOSE-CAPILLARY: 106 mg/dL — AB (ref 65–99)
GLUCOSE-CAPILLARY: 113 mg/dL — AB (ref 65–99)
GLUCOSE-CAPILLARY: 369 mg/dL — AB (ref 65–99)
Glucose-Capillary: 157 mg/dL — ABNORMAL HIGH (ref 65–99)
Glucose-Capillary: 285 mg/dL — ABNORMAL HIGH (ref 65–99)

## 2015-01-21 LAB — CBC
HEMATOCRIT: 20.4 % — AB (ref 40.0–52.0)
Hemoglobin: 6.5 g/dL — ABNORMAL LOW (ref 13.0–18.0)
MCH: 28.9 pg (ref 26.0–34.0)
MCHC: 31.8 g/dL — AB (ref 32.0–36.0)
MCV: 90.9 fL (ref 80.0–100.0)
Platelets: 251 10*3/uL (ref 150–440)
RBC: 2.24 MIL/uL — ABNORMAL LOW (ref 4.40–5.90)
RDW: 19.4 % — ABNORMAL HIGH (ref 11.5–14.5)
WBC: 6.8 10*3/uL (ref 3.8–10.6)

## 2015-01-21 LAB — ABO/RH: ABO/RH(D): O POS

## 2015-01-21 LAB — OCCULT BLOOD X 1 CARD TO LAB, STOOL: Fecal Occult Bld: NEGATIVE

## 2015-01-21 LAB — HEMOGLOBIN AND HEMATOCRIT, BLOOD
HCT: 45.9 % (ref 40.0–52.0)
Hemoglobin: 13.3 g/dL (ref 13.0–18.0)

## 2015-01-21 LAB — PREPARE RBC (CROSSMATCH)

## 2015-01-21 LAB — SODIUM
Sodium: 146 mmol/L — ABNORMAL HIGH (ref 135–145)
Sodium: 144 mmol/L (ref 135–145)

## 2015-01-21 MED ORDER — SODIUM CHLORIDE 0.9 % IV SOLN
Freq: Once | INTRAVENOUS | Status: AC
  Administered 2015-01-21: 12:00:00 via INTRAVENOUS

## 2015-01-21 MED ORDER — PIPERACILLIN-TAZOBACTAM 3.375 G IVPB
3.3750 g | Freq: Three times a day (TID) | INTRAVENOUS | Status: DC
  Administered 2015-01-21 – 2015-01-24 (×9): 3.375 g via INTRAVENOUS
  Filled 2015-01-21 (×16): qty 50

## 2015-01-21 MED ORDER — INSULIN ASPART 100 UNIT/ML ~~LOC~~ SOLN
0.0000 [IU] | Freq: Three times a day (TID) | SUBCUTANEOUS | Status: DC
  Administered 2015-01-21: 2 [IU] via SUBCUTANEOUS
  Administered 2015-01-21 (×2): 5 [IU] via SUBCUTANEOUS
  Administered 2015-01-21: 9 [IU] via SUBCUTANEOUS
  Administered 2015-01-22 (×3): 2 [IU] via SUBCUTANEOUS
  Administered 2015-01-22: 9 [IU] via SUBCUTANEOUS
  Administered 2015-01-23: 5 [IU] via SUBCUTANEOUS
  Administered 2015-01-23: 3 [IU] via SUBCUTANEOUS
  Administered 2015-01-23: 9 [IU] via SUBCUTANEOUS
  Administered 2015-01-24: 3 [IU] via SUBCUTANEOUS
  Administered 2015-01-24: 9 [IU] via SUBCUTANEOUS
  Administered 2015-01-24: 5 [IU] via SUBCUTANEOUS
  Administered 2015-01-25: 7 [IU] via SUBCUTANEOUS
  Filled 2015-01-21: qty 7
  Filled 2015-01-21: qty 3
  Filled 2015-01-21: qty 9
  Filled 2015-01-21: qty 5
  Filled 2015-01-21: qty 9
  Filled 2015-01-21: qty 2
  Filled 2015-01-21 (×2): qty 5
  Filled 2015-01-21: qty 9
  Filled 2015-01-21: qty 2
  Filled 2015-01-21: qty 5
  Filled 2015-01-21: qty 9
  Filled 2015-01-21 (×2): qty 2
  Filled 2015-01-21: qty 3

## 2015-01-21 MED ORDER — INSULIN ASPART 100 UNIT/ML ~~LOC~~ SOLN
0.0000 [IU] | Freq: Three times a day (TID) | SUBCUTANEOUS | Status: DC
Start: 2015-01-21 — End: 2015-01-21

## 2015-01-21 MED ORDER — ALBUTEROL SULFATE (2.5 MG/3ML) 0.083% IN NEBU
2.5000 mg | INHALATION_SOLUTION | RESPIRATORY_TRACT | Status: DC | PRN
Start: 2015-01-21 — End: 2015-01-27
  Administered 2015-01-25: 2.5 mg via RESPIRATORY_TRACT
  Filled 2015-01-21 (×2): qty 3

## 2015-01-21 MED ORDER — DEXTROSE 5 % IV SOLN
INTRAVENOUS | Status: DC
  Administered 2015-01-21: 09:00:00 via INTRAVENOUS
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
## 2015-01-21 NOTE — Progress Notes (Signed)
Spoke with Dr. Jannifer Franklin about Pt's elevated blood sugar. Orders to stop D5 were given. Will continue to monitor

## 2015-01-21 NOTE — Progress Notes (Signed)
ANTIBIOTIC CONSULT NOTE - FOLLOW UP  Pharmacy Consult for Vancomycin and Zosyn  Indication: rule out sepsis  No Known Allergies  Patient Measurements: Weight: 200 lb (90.719 kg) Adjusted Body Weight: 83 kg  Vital Signs: Temp: 97.8 F (36.6 C) (07/08 0700) Temp Source: Oral (07/08 0700) BP: 164/87 mmHg (07/08 0700) Pulse Rate: 92 (07/08 0700) Intake/Output from previous day: 07/07 0701 - 07/08 0700 In: 1520 [I.V.:1270; IV Piggyback:250] Out: 2350 [Urine:2350] Intake/Output from this shift: Total I/O In: -  Out: 375 [Urine:375]  Labs:  Recent Labs  01/19/15 0449 01/20/15 0645 01/20/15 0903 01/21/15 0616  WBC 5.6  --  6.9 6.8  HGB 7.2*  --  6.7* 6.5*  PLT 248  --  239 251  CREATININE 3.59* 3.26*  --  2.80*   Estimated Creatinine Clearance: 24.3 mL/min (by C-G formula based on Cr of 2.8).  Recent Labs  01/19/15 0449  [facility] 15     Microbiology: Recent Results (from the past 720 hour(s))  Urine culture     Status: None   Collection Time: 01/03/15  3:20 PM  Result Value Ref Range Status   Specimen Description URINE, RANDOM  Final   Special Requests NONE  Final   Culture NO GROWTH 2 DAYS  Final   Report Status 01/05/2015 FINAL  Final  Blood Culture (routine x 2)     Status: None   Collection Time: 01/04/15 12:40 PM  Result Value Ref Range Status   Specimen Description BLOOD  Final   Special Requests NONE  Final   Culture NO GROWTH 5 DAYS  Final   Report Status 01/09/2015 FINAL  Final  Blood Culture (routine x 2)     Status: None   Collection Time: 01/04/15 12:40 PM  Result Value Ref Range Status   Specimen Description BLOOD  Final   Special Requests NONE  Final   Culture NO GROWTH 5 DAYS  Final   Report Status 01/09/2015 FINAL  Final  Urine culture     Status: None   Collection Time: 01/04/15 12:41 PM  Result Value Ref Range Status   Specimen Description URINE, RANDOM  Final   Special Requests NONE  Final   Culture NO GROWTH  Final   Report  Status 01/06/2015 FINAL  Final  MRSA PCR Screening     Status: None   Collection Time: 01/04/15  5:25 PM  Result Value Ref Range Status   MRSA by PCR NEGATIVE NEGATIVE Final    Comment:        The GeneXpert MRSA Assay (FDA approved for NASAL specimens only), is one component of a comprehensive MRSA colonization surveillance program. It is not intended to diagnose MRSA infection nor to guide or monitor treatment for MRSA infections.   Blood Culture (routine x 2)     Status: None (Preliminary result)   Collection Time: 01/17/15 10:59 AM  Result Value Ref Range Status   Specimen Description BLOOD LEFT ARM  Final   Special Requests BOTTLES DRAWN AEROBIC AND ANAEROBIC .5 CC  Final   Culture NO GROWTH 4 DAYS  Final   Report Status PENDING  Incomplete  Blood Culture (routine x 2)     Status: None (Preliminary result)   Collection Time: 01/17/15 11:11 AM  Result Value Ref Range Status   Specimen Description BLOOD LEFT HAND  Final   Special Requests BOTTLES DRAWN AEROBIC AND ANAEROBIC  Final   Culture NO GROWTH 4 DAYS  Final   Report Status PENDING  Incomplete  Urine culture     Status: None   Collection Time: 01/17/15  1:20 PM  Result Value Ref Range Status   Specimen Description Urine  Final   Special Requests NONE  Final   Culture NO GROWTH 1 DAY  Final   Report Status 01/19/2015 FINAL  Final  Urine culture     Status: None   Collection Time: 01/17/15  5:58 PM  Result Value Ref Range Status   Specimen Description URINE, CLEAN CATCH  Final   Special Requests NONE  Final   Culture NO GROWTH 1 DAY  Final   Report Status 01/19/2015 FINAL  Final  MRSA PCR Screening     Status: None   Collection Time: 01/17/15  5:59 PM  Result Value Ref Range Status   MRSA by PCR NEGATIVE NEGATIVE Final    Comment:        The GeneXpert MRSA Assay (FDA approved for NASAL specimens only), is one component of a comprehensive MRSA colonization surveillance program. It is not intended to diagnose  MRSA infection nor to guide or monitor treatment for MRSA infections.     Anti-infectives    Start     Dose/Rate Route Frequency Ordered Stop   01/19/15 0700  vancomycin (VANCOCIN) IVPB 1000 mg/200 mL premix     1,000 mg 200 mL/hr over 60 Minutes Intravenous Every 36 hours 01/18/15 0907     01/18/15 1700  piperacillin-tazobactam (ZOSYN) IVPB 3.375 g     3.375 g 12.5 mL/hr over 240 Minutes Intravenous Every 12 hours 01/18/15 0848     01/17/15 1900  vancomycin (VANCOCIN) IVPB 1000 mg/200 mL premix  Status:  Discontinued     1,000 mg 200 mL/hr over 60 Minutes Intravenous Every 24 hours 01/17/15 1336 01/18/15 0907   01/17/15 1800  piperacillin-tazobactam (ZOSYN) IVPB 3.375 g  Status:  Discontinued     3.375 g 12.5 mL/hr over 240 Minutes Intravenous 3 times per day 01/17/15 1337 01/18/15 0848   01/17/15 1530  piperacillin-tazobactam (ZOSYN) IVPB 3.375 g  Status:  Discontinued     3.375 g 100 mL/hr over 30 Minutes Intravenous 3 times per day 01/17/15 1515 01/17/15 1600   01/17/15 1100  piperacillin-tazobactam (ZOSYN) IVPB 3.375 g     3.375 g 100 mL/hr over 30 Minutes Intravenous  Once 01/17/15 1050 01/17/15 1144   01/17/15 1100  vancomycin (VANCOCIN) IVPB 1000 mg/200 mL premix     1,000 mg 200 mL/hr over 60 Minutes Intravenous  Once 01/17/15 1050 01/17/15 1410      Assessment: Pharmacy consulted to dose Vancomycin and Zosyn in a 80 yo male for empiric treatment of sepsis.   Patient serum creatinine continues to improve currently 2.8.   Goal of Therapy:  Vancomycin trough level 15-20 mcg/ml  Plan:  Will continue current regimen of Vancomycin 1 g IV q36 hours. Will order Vancomycin level prior to the 07:00 dose on 7/9. Level will not be @ [facility].  Patient still qualifies for q36 hours based on calculated kinetics.  Will change Zosyn from q12 hours to q8 hours.   Alan Mcintyre D 01/21/2015,8:08 AM
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
## 2015-01-21 NOTE — Progress Notes (Signed)
Subjective:  Off warming blanket.  Cr 2.80 now.  Hgb still low at 6.5.  More awake and alert today. Objective:  Vital signs in last 24 hours:  Temp:  [97 F (36.1 C)-99.7 F (37.6 C)] 97.8 F (36.6 C) (07/08 0700) Pulse Rate:  [59-115] 92 (07/08 0700) Resp:  [6-21] 13 (07/08 0700) BP: (139-193)/(70-95) 164/87 mmHg (07/08 0700) SpO2:  [90 %-100 %] 99 % (07/08 0816)  Weight change:  Filed Weights   01/17/15 1037  Weight: 90.719 kg (200 lb)    Intake/Output: I/O last 3 completed shifts: In: 2628.3 [I.V.:2328.3; IV Piggyback:300] Out: 3200 [Urine:3200]     Physical Exam: General: NAD  HEENT Moist mucus moist, eyes open  Neck supple  Pulm/lungs Clear bilateral, normal effort  CVS/Heart Regular, no rub  Abdomen:  Soft, non tender, BS present  Extremities: 1+ peripheral and dependent edema  Neurologic: Awake, alert, following commands this AM  Skin: No acute rashes          Basic Metabolic Panel:  Recent Labs Lab 01/17/15 1116 01/17/15 1819 01/18/15 0624 01/19/15 0449 01/20/15 0645 01/20/15 0903 01/20/15 2032 01/21/15 0616  NA 141  --  146* 147* 148* 149* 143 147*  K 4.6  --  5.0 5.0 4.5  --   --  4.4  CL 112*  --  117* 118* 117*  --   --  114*  CO2 24  --  24 25 26   --   --  26  GLUCOSE 197*  --  95 138* 108*  --   --  103*  BUN 52*  --  47* 46* 39*  --   --  38*  CREATININE 3.29* 3.31* 3.48* 3.59* 3.26*  --   --  2.80*  CALCIUM 8.9  --  8.7* 9.3 9.4  --   --  9.4  MG  --   --   --   --  2.4  --   --   --   PHOS  --   --   --   --  3.5  --   --   --      CBC:  Recent Labs Lab 01/17/15 1116 01/17/15 1819 01/18/15 0624 01/18/15 1525 01/19/15 0449 01/20/15 0903 01/21/15 0616  WBC 5.3 4.2 5.3  --  5.6 6.9 6.8  NEUTROABS 3.9  --   --   --   --   --   --   HGB 7.3* 6.9* 6.6* 6.7* 7.2* 6.7* 6.5*  HCT 23.1* 21.8* 21.2*  --  23.0* 21.0* 20.4*  MCV 94.5 95.2 93.9  --  94.4 92.0 90.9  PLT 150 169 197  --  248 239 251       Microbiology: Results for orders placed or performed during the hospital encounter of 01/17/15  Blood Culture (routine x 2)     Status: None (Preliminary result)   Collection Time: 01/17/15 10:59 AM  Result Value Ref Range Status   Specimen Description BLOOD LEFT ARM  Final   Special Requests BOTTLES DRAWN AEROBIC AND ANAEROBIC .5 CC  Final   Culture NO GROWTH 4 DAYS  Final   Report Status PENDING  Incomplete  Blood Culture (routine x 2)     Status: None (Preliminary result)   Collection Time: 01/17/15 11:11 AM  Result Value Ref Range Status   Specimen Description BLOOD LEFT HAND  Final   Special Requests BOTTLES DRAWN AEROBIC AND ANAEROBIC  Final   Culture NO GROWTH 4 DAYS  Final   Report Status PENDING  Incomplete  Urine culture     Status: None   Collection Time: 01/17/15  1:20 PM  Result Value Ref Range Status   Specimen Description Urine  Final   Special Requests NONE  Final   Culture NO GROWTH 1 DAY  Final   Report Status 01/19/2015 FINAL  Final  Urine culture     Status: None   Collection Time: 01/17/15  5:58 PM  Result Value Ref Range Status   Specimen Description URINE, CLEAN CATCH  Final   Special Requests NONE  Final   Culture NO GROWTH 1 DAY  Final   Report Status 01/19/2015 FINAL  Final  MRSA PCR Screening     Status: None   Collection Time: 01/17/15  5:59 PM  Result Value Ref Range Status   MRSA by PCR NEGATIVE NEGATIVE Final    Comment:        The GeneXpert MRSA Assay (FDA approved for NASAL specimens only), is one component of a comprehensive MRSA colonization surveillance program. It is not intended to diagnose MRSA infection nor to guide or monitor treatment for MRSA infections.     Coagulation Studies: No results for input(s): LABPROT, INR in the last 72 hours.  Urinalysis: No results for input(s): COLORURINE, LABSPEC, PHURINE, GLUCOSEU, HGBUR, BILIRUBINUR, KETONESUR, PROTEINUR, UROBILINOGEN, NITRITE, LEUKOCYTESUR in the last 72  hours.  Invalid input(s): APPERANCEUR    Imaging: Ct Head Wo Contrast  01/20/2015   CLINICAL DATA:  Sepsis, acute encephalopathy, difficulty speaking, history COPD, chronic kidney disease, hypertension, diabetes mellitus  EXAM: CT HEAD WITHOUT CONTRAST  TECHNIQUE: Contiguous axial images were obtained from the base of the skull through the vertex without intravenous contrast.  COMPARISON:  01/04/2015  FINDINGS: Beam hardening artifacts from metallic foreign bodies at the posterior RIGHT parietal region.  Generalized atrophy.  Normal ventricular morphology.  No midline shift or mass effect.  Small vessel chronic ischemic changes of deep cerebral white matter.  No intracranial hemorrhage, mass lesion, or evidence acute infarction.  No extra-axial fluid collections.  Sinuses clear and bones unremarkable.  IMPRESSION: Atrophy with small vessel chronic ischemic changes of deep cerebral white matter.  No acute intracranial abnormalities.   Electronically Signed   By: Lavonia Dana M.D.   On: 01/20/2015 09:39   US Renal  01/20/2015   CLINICAL DATA:  66 old male with acute renal failure. Subsequent encounter.  EXAM: RENAL / URINARY TRACT ULTRASOUND COMPLETE  COMPARISON:  01/06/2015 ultrasound.  05/21/2013 CT.  FINDINGS: Right Kidney:  Length: 9.2 cm.  No hydronephrosis.  No mass identified.  Left Kidney:  Length: 10.4 cm. No hydronephrosis. Left renal cysts are noted measure up to 1.2 cm. Evaluation is somewhat limited by patient not following directions and with limited mobility.  Bladder:  Foley catheter in place decompressed.  IMPRESSION: No hydronephrosis.  Left renal cystic structures less well delineated on the present examination secondary to patient not able to hold breath or move.   Electronically Signed   By: Genia Del M.D.   On: 01/20/2015 18:09     Medications:     . sodium chloride   Intravenous Once  . albuterol  2.5 mg Nebulization Q4H  . antiseptic oral rinse  7 mL Mouth Rinse  q12n4p  . aspirin EC  81 mg Oral Daily  . budesonide (PULMICORT) nebulizer solution  0.5 mg Nebulization BID  . chlorhexidine  15 mL Mouth Rinse BID  . cholecalciferol  1,000 Units Oral  Daily  . docusate sodium  100 mg Oral BID  . dutasteride  0.5 mg Oral Daily  . heparin  5,000 Units Subcutaneous 3 times per day  . insulin aspart  0-9 Units Subcutaneous TID AC & HS  . magnesium oxide  400 mg Oral Daily  . methylPREDNISolone (SOLU-MEDROL) injection  60 mg Intravenous Q24H  . metoprolol  50 mg Oral BID  . piperacillin-tazobactam (ZOSYN)  IV  3.375 g Intravenous 3 times per day  . polyethylene glycol  17 g Oral q morning - 10a  . saccharomyces boulardii  250 mg Oral TID  . sodium chloride  3 mL Intravenous Q12H  . tamsulosin  0.4 mg Oral Daily  . vancomycin  1,000 mg Intravenous Q36H   sodium chloride, acetaminophen **OR** acetaminophen, hydrALAZINE, ondansetron **OR** ondansetron (ZOFRAN) IV, sodium chloride  Assessment/ Plan:  80 y.o. Black male with peripheral vascular disease, COPD, chronic kidney disease stage III Baseline creatinine 1.5, diabetes mellitus, hypertension, anemia of chronic kidney disease, dementia, BPH, history of colon cancer, peripheral neuropathy, gastroparesis, multiple prior episodes of acute renal failure.    1. Acute renal failure/chronic kidney disease stage III Baseline creatinine 1.5/proteinuria. The patient has had prior episodes of acute renal failure. Negative spep/anca/gbm last admission, was hypothermic upon admission. Suspect that pt's acute renal failure is multifactorial now.  Was hypothermic suggesting a sepsis etiology, UA also indicative of UTI.   - Good UOP of 2.3 liters, Cr down to 2.8, continue supportive care, no acute indication for HD.   2. Hypernatremia. Na now 147, will restart D5W at 50cc/hr.  3. Anemia of chronic kidney disease: hgb down to 6.5, consider transfusion but defer to Dr. Margaretmary Eddy.  4. Sepsis with urinary tract infection:  continue zoysn and vancomycin.   LOS: 4 Fenix Ruppe 7/8/20168:43 AM

## 2015-01-21 NOTE — Progress Notes (Signed)
Neurology:  80 y.o. male with a known history of chronic kidney disease, peripheral vascular disease, COPD, diabetes, hypertension who was recently hospitalized from June 21 of June 26 with weakness at that time he was diagnosed with a urinary tract infection and sepsis he was discharged to the skilled nursing facility. Patient sent from the nursing facility due to decrease in responsiveness shortness of breath. Patient in the ER was noted to be very hypothermic.  Pt was found to have PNA and UTI for which he is being treated.  No family at bedside to assists with history. Pt also has likely history of dementia.     Neurological Examination Mental Status: Speech is dysarthric but able to be understood.   Cranial Nerves: II: Discs flat bilaterally; Visual fields grossly normal, pupils equal, round, reactive to light and accommodation III,IV, VI: ptosis not present, extra-ocular motions intact bilaterally V,VII: smile symmetric, facial light touch sensation normal bilaterally VIII: hearing normal bilaterally IX,X: gag reflex present XI: bilateral shoulder shrug XII: midline tongue extension Motor: Right : Upper extremity   4/5    Left:     Upper extremity   4/5  Lower extremity   4/5     Lower extremity   4/5 Tone and bulk:normal tone throughout; no atrophy noted Sensory: Pinprick and light touch intact throughout, bilaterally Deep Tendon Reflexes: 1+ and symmetric throughout Plantars: Right: downgoing   Left: downgoing Cerebellar: normal finger-to-nose, normal rapid alternating movements and normal heel-to-shin test Gait: not tested    A/P 80 y/o admitted with altered mental status in the setting of hypercapnic resp failure and UTI.  Neurology for evaluation of dysarthria.  Pt has no other focal deficits.   - Not convinced this is acute, pt has not other focal deficits.   - CTH reviewed no acute abnormality - Would not perform any further imaging - Transfusion as per primary  team.  Leotis Pain

## 2015-01-21 NOTE — Progress Notes (Signed)
[facility] [place] NAME: Alan Mcintyre    MR#:  [id number]  DATE OF BIRTH:  25-Aug-1934  SUBJECTIVE:  CHIEF COMPLAINT:   Chief Complaint  Patient presents with  . Shortness of Breath    from nursing home with reported dyspnea   Admitted for AMS, hypothermia and Hypoxia. Off Bipap, off Bair  hugger More awake and alert. Speech is better. Patient has chronic dementia, according to the facility staff patient's speech is clear as reported by the RN. Was in hospital recently for same with hypothermia, UTI, hypoglycemia. REVIEW OF SYSTEMS:    ROS  Unobtainable due to dementia  DRUG ALLERGIES:  No Known Allergies  VITALS:  Blood pressure 167/99, pulse 50, temperature 97.7 F (36.5 C), temperature source Oral, resp. rate 13, weight 90.719 kg (200 lb), SpO2 100 %.  PHYSICAL EXAMINATION:   Physical Exam  GENERAL:  80 y.o.-year-old patient lying in the bed with no acute distress. Pale EYES: Pupils equal, round, reactive to light and accommodation. No scleral icterus. Extraocular muscles intact.  HEENT: Head atraumatic, normocephalic. Oropharynx and nasopharynx clear.  NECK:  Supple, no jugular venous distention. No thyroid enlargement, no tenderness.  LUNGS: Coarse BS. CARDIOVASCULAR: S1, S2 normal. No murmurs, rubs, or gallops.  ABDOMEN: Soft, nontender, nondistended. Bowel sounds present. No organomegaly or mass.  EXTREMITIES: No cyanosis, clubbing. 2 + edema LE. NEUROLOGIC: Cranial nerves II through XII are intact. No focal Motor or sensory deficits b/l.   PSYCHIATRIC: The patient is more alert SKIN: No obvious rash, lesion, or ulcer.    LABORATORY PANEL:   CBC  Recent Labs Lab 01/21/15 0616  WBC 6.8  HGB 6.5*  HCT 20.4*  PLT 251   ------------------------------------------------------------------------------------------------------------------  Chemistries   Recent Labs Lab 01/17/15 1116  01/20/15 0645   01/21/15 0616 01/21/15 0853  NA 141  < > 148*  < > 147* 146*  K 4.6  < > 4.5  --  4.4  --   CL 112*  < > 117*  --  114*  --   CO2 24  < > 26  --  26  --   GLUCOSE 197*  < > 108*  --  103*  --   BUN 52*  < > 39*  --  38*  --   CREATININE 3.29*  < > 3.26*  --  2.80*  --   CALCIUM 8.9  < > 9.4  --  9.4  --   MG  --   --  2.4  --   --   --   AST 25  --   --   --   --   --   ALT 26  --   --   --   --   --   ALKPHOS 87  --   --   --   --   --   BILITOT 0.4  --   --   --   --   --   < > = values in this interval not displayed. ------------------------------------------------------------------------------------------------------------------  Cardiac Enzymes  Recent Labs Lab 01/17/15 1116  TROPONINI <0.03   ------------------------------------------------------------------------------------------------------------------  RADIOLOGY:  Ct Head Wo Contrast  01/20/2015   CLINICAL DATA:  Sepsis, acute encephalopathy, difficulty speaking, history COPD, chronic kidney disease, hypertension, diabetes mellitus  EXAM: CT HEAD WITHOUT CONTRAST  TECHNIQUE: Contiguous axial images were obtained from the base of the skull through the vertex without intravenous contrast.  COMPARISON:  01/04/2015  FINDINGS: Beam hardening artifacts from metallic foreign bodies at the posterior RIGHT parietal region.  Generalized atrophy.  Normal ventricular morphology.  No midline shift or mass effect.  Small vessel chronic ischemic changes of deep cerebral white matter.  No intracranial hemorrhage, mass lesion, or evidence acute infarction.  No extra-axial fluid collections.  Sinuses clear and bones unremarkable.  IMPRESSION: Atrophy with small vessel chronic ischemic changes of deep cerebral white matter.  No acute intracranial abnormalities.   Electronically Signed   By: Lavonia Dana M.D.   On: 01/20/2015 09:39   US Renal  01/20/2015   CLINICAL DATA:  4 old male with acute renal failure. Subsequent encounter.   EXAM: RENAL / URINARY TRACT ULTRASOUND COMPLETE  COMPARISON:  01/06/2015 ultrasound.  05/21/2013 CT.  FINDINGS: Right Kidney:  Length: 9.2 cm.  No hydronephrosis.  No mass identified.  Left Kidney:  Length: 10.4 cm. No hydronephrosis. Left renal cysts are noted measure up to 1.2 cm. Evaluation is somewhat limited by patient not following directions and with limited mobility.  Bladder:  Foley catheter in place decompressed.  IMPRESSION: No hydronephrosis.  Left renal cystic structures less well delineated on the present examination secondary to patient not able to hold breath or move.   Electronically Signed   By: Genia Del M.D.   On: 01/20/2015 18:09     ASSESSMENT AND PLAN:   * Aspiration pneumonia On IV abx. Consult speech, recommends pured diet with nectar thick liquids  Follow up with pulmonary. BiPAP when necessary  * Acute respiratory failure O2 for sats > 90% Nebs PRN IV Solu-Medrol  * UTI On IV abx. Urine culture negative so far  * ARF over CKD3 Start D5-NS due to hypernatremia, follow serial sodiums Follow-up with nephrology. Renal ultrasound is pending I/Os.  * Severe sepsis Improving Blood cultures are negative so far  * Acute encephalopathy Clinically better  *Dysarthria Will obtain CT head, no past medical history of strokes, but recent CT head with remote infarcts   neurology consult is placed  *  Anemia of chronic disease with worsening Daughter is agreeable with blood transfusion.  transfuse 2 units today and repeat CBC  I don't think patient has acute GI bleed . Hemoglobin today is at 6.5 probably from hemodilution and blood draws , as his baseline itself is low   Stool for Hemoccult  * Hypothermia due to sepsis Improved. Off Bair hugger  * COPD Nebs. steroids  * DM SSI  * Palliative care consult is placed, discussed with Dr. Megan Salon  Will consider transferring the patient to telemetry after blood transfusion today  All the records are  reviewed and case discussed with Care Management/Social Workerr. Management plans discussed with the patient, family and they are in agreement.    CODE STATUS: FULL CODE  DVT Prophylaxis: SCDs  TOTAL CRITICAL CARE TIME TAKING CARE OF THIS PATIENT: 35 minutes.    Nicholes Mango M.D on 01/21/2015 at 2:22 PM  Between 7am to 6pm - Pager - [phone]  After 6pm go to [email or web address] - password [email or web address] [facility]  Office  [phone]  CC: Primary care physician; Lorelee Market, MD
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
## 2015-01-21 NOTE — Progress Notes (Signed)
Speech Language Pathology Treatment: Dysphagia  Patient Details Name: Treavon Salters MRN: [id number] DOB: 22-Dec-1934 Today's Date: 01/21/2015 Time: 1325-1410 SLP Time Calculation (min) (ACUTE ONLY): 45 min  Assessment / Plan / Recommendation Clinical Impression  Pt appeared to tolerate Dysphagia I diet w/ Nectar liquids w/ no overt s/s of aspiration noted. Pt fed self and consumed several ozs of purees and drank ~16 ozs od Nectar liquids via straw. Min+ oral phase deficits noted c/b decreased oral awareness for bolus size and min. impulsivity feeding self. Food spilled in the bed as well but w/ min. assist w/ the feeding, pt was able to manage his oral diet adquately. Pt's respiratory status and coordination w/ timing of breathing and swallowing appeared improved. Pt appears at reduced risk for aspiration w/ a modified diet following general aspiration precautions and assistance during the meals. Rec. ST f/u for trials to upgrade if appropriate, however, pt is at increased risk for aspiration sec. to his baseline Cognitive status/decline.   HPI Other Pertinent Information: Pt admitted w/ sepsis; UTI; acute metabolic encephalopathy. Hypothermia; improved. Pt is more awake/alert than yesterday/admission, verbal and feeding self during lunch meal when SLP arrived. Pt has dxs of diabetic gastroparesis and Dementia per chart   Pertinent Vitals Pain Assessment: No/denies pain  SLP Plan  Continue with current plan of care    Recommendations Diet recommendations: Dysphagia 1 (puree);Nectar-thick liquid Liquids provided via: Cup;Straw Medication Administration: Crushed with puree Supervision: Trained caregiver to feed patient;Full supervision/cueing for compensatory strategies Compensations: Slow rate;Small sips/bites Postural Changes and/or Swallow Maneuvers: Seated upright 90 degrees              General recommendations:  (Dietician following) Oral Care Recommendations: Oral care BID;Oral care  before and after PO;Staff/trained caregiver to provide oral care Follow up Recommendations: Skilled Nursing facility Plan: Continue with current plan of care    GO     [facility] 01/21/2015, 3:33 PM

## 2015-01-21 NOTE — Care Management (Signed)
Important Message  Patient Details  Name: Alan Mcintyre MRN: [id number] Date of Birth: Nov 13, 1934   Medicare Important Message Given:  Yes-second notification given    Juliann Pulse A Allmond 01/21/2015, 1:11 PM

## 2015-01-22 ENCOUNTER — Encounter: Payer: Self-pay | Admitting: *Deleted

## 2015-01-22 LAB — CBC
HCT: 27.9 % — ABNORMAL LOW (ref 40.0–52.0)
Hemoglobin: 9 g/dL — ABNORMAL LOW (ref 13.0–18.0)
MCH: 28.5 pg (ref 26.0–34.0)
MCHC: 32.2 g/dL (ref 32.0–36.0)
MCV: 88.4 fL (ref 80.0–100.0)
Platelets: 264 K/uL (ref 150–440)
RBC: 3.15 MIL/uL — ABNORMAL LOW (ref 4.40–5.90)
RDW: 19.4 % — ABNORMAL HIGH (ref 11.5–14.5)
WBC: 8.5 K/uL (ref 3.8–10.6)

## 2015-01-22 LAB — CULTURE, BLOOD (ROUTINE X 2)
CULTURE: NO GROWTH
Culture: NO GROWTH

## 2015-01-22 LAB — GLUCOSE, CAPILLARY
GLUCOSE-CAPILLARY: 194 mg/dL — AB (ref 65–99)
GLUCOSE-CAPILLARY: 195 mg/dL — AB (ref 65–99)
Glucose-Capillary: 181 mg/dL — ABNORMAL HIGH (ref 65–99)
Glucose-Capillary: 369 mg/dL — ABNORMAL HIGH (ref 65–99)

## 2015-01-22 LAB — MAGNESIUM: Magnesium: 2.1 mg/dL (ref 1.7–2.4)

## 2015-01-22 LAB — BASIC METABOLIC PANEL
Anion gap: 6 (ref 5–15)
BUN: 38 mg/dL — AB (ref 6–20)
CHLORIDE: 113 mmol/L — AB (ref 101–111)
CO2: 27 mmol/L (ref 22–32)
CREATININE: 2.62 mg/dL — AB (ref 0.61–1.24)
Calcium: 9.4 mg/dL (ref 8.9–10.3)
GFR calc Af Amer: 25 mL/min — ABNORMAL LOW (ref >60)
GFR calc non Af Amer: 22 mL/min — ABNORMAL LOW (ref >60)
Glucose, Bld: 182 mg/dL — ABNORMAL HIGH (ref 65–99)
Potassium: 4.5 mmol/L (ref 3.5–5.1)
SODIUM: 146 mmol/L — AB (ref 135–145)

## 2015-01-22 LAB — SODIUM
SODIUM: 144 mmol/L (ref 135–145)
Sodium: 146 mmol/L — ABNORMAL HIGH (ref 135–145)

## 2015-01-22 LAB — PHOSPHORUS: Phosphorus: 3.2 mg/dL (ref 2.5–4.6)

## 2015-01-22 NOTE — Progress Notes (Signed)
Tx from CCU. Room air. NSR. Fs are stable. Takes meds whole in apple sauce. Foley. Bedbound. Alert to self. Pt has not reported any apin. EF 55-60%. Skin ok. Pt has no further concerns at this time.

## 2015-01-22 NOTE — Progress Notes (Signed)
Subjective:  More awake and alert today. uop good- 2425 cc S Cr improved a little Able to answer simple Qs   Objective:  Vital signs in last 24 hours:  Temp:  [97.6 F (36.4 C)-98.1 F (36.7 C)] 97.7 F (36.5 C) (07/09 0800) Pulse Rate:  [49-91] 59 (07/09 0800) Resp:  [7-19] 15 (07/09 0800) BP: (125-195)/(48-116) 153/87 mmHg (07/09 0800) SpO2:  [83 %-100 %] 100 % (07/09 0800) Weight:  [90.7 kg (199 lb 15.3 oz)] 90.7 kg (199 lb 15.3 oz) (07/09 0300)  Weight change:  Filed Weights   01/17/15 1037 01/22/15 0300  Weight: 90.719 kg (200 lb) 90.7 kg (199 lb 15.3 oz)    Intake/Output: I/O last 3 completed shifts: In: 3257.8 [P.O.:1180; I.V.:841.7; Blood:736.1; IV Piggyback:500] Out: [id number] [Urine:3325; Stool:3]     Physical Exam: General: NAD  HEENT Moist mucus moist, eyes open  Neck supple  Pulm/lungs Clear bilateral, normal effort  CVS/Heart Regular, no rub  Abdomen:  Soft, non tender, BS present  Extremities: 1+ peripheral and dependent edema, heel ulcers  Neurologic: Awake, alert, following commands this AM  Skin: No acute rashes, scaly   Foley       Basic Metabolic Panel:  Recent Labs Lab 01/18/15 0624 01/19/15 0449 01/20/15 0645  01/21/15 0616 01/21/15 0853 01/21/15 2001 01/22/15 0522 01/22/15 0816  NA 146* 147* 148*  < > 147* 146* 144 146* 146*  K 5.0 5.0 4.5  --  4.4  --   --  4.5  --   CL 117* 118* 117*  --  114*  --   --  113*  --   CO2 24 25 26   --  26  --   --  27  --   GLUCOSE 95 138* 108*  --  103*  --   --  182*  --   BUN 47* 46* 39*  --  38*  --   --  38*  --   CREATININE 3.48* 3.59* 3.26*  --  2.80*  --   --  2.62*  --   CALCIUM 8.7* 9.3 9.4  --  9.4  --   --  9.4  --   MG  --   --  2.4  --   --   --   --  2.1  --   PHOS  --   --  3.5  --   --   --   --  3.2  --   < > = values in this interval not displayed.   CBC:  Recent Labs Lab 01/17/15 1116  01/18/15 0624  01/19/15 0449 01/20/15 0903 01/21/15 0616 01/21/15 2001  01/22/15 0522  WBC 5.3  < > 5.3  --  5.6 6.9 6.8  --  8.5  NEUTROABS 3.9  --   --   --   --   --   --   --   --   HGB 7.3*  < > 6.6*  < > 7.2* 6.7* 6.5* 13.3 9.0*  HCT 23.1*  < > 21.2*  --  23.0* 21.0* 20.4* 45.9 27.9*  MCV 94.5  < > 93.9  --  94.4 92.0 90.9  --  88.4  PLT 150  < > 197  --  248 239 251  --  264  < > = values in this interval not displayed.    Microbiology: Results for orders placed or performed during the hospital encounter of 01/17/15  Blood Culture (routine x 2)  Status: None (Preliminary result)   Collection Time: 01/17/15 10:59 AM  Result Value Ref Range Status   Specimen Description BLOOD LEFT ARM  Final   Special Requests BOTTLES DRAWN AEROBIC AND ANAEROBIC .5 CC  Final   Culture NO GROWTH 4 DAYS  Final   Report Status PENDING  Incomplete  Blood Culture (routine x 2)     Status: None (Preliminary result)   Collection Time: 01/17/15 11:11 AM  Result Value Ref Range Status   Specimen Description BLOOD LEFT HAND  Final   Special Requests BOTTLES DRAWN AEROBIC AND ANAEROBIC  Final   Culture NO GROWTH 4 DAYS  Final   Report Status PENDING  Incomplete  Urine culture     Status: None   Collection Time: 01/17/15  1:20 PM  Result Value Ref Range Status   Specimen Description Urine  Final   Special Requests NONE  Final   Culture NO GROWTH 1 DAY  Final   Report Status 01/19/2015 FINAL  Final  Urine culture     Status: None   Collection Time: 01/17/15  5:58 PM  Result Value Ref Range Status   Specimen Description URINE, CLEAN CATCH  Final   Special Requests NONE  Final   Culture NO GROWTH 1 DAY  Final   Report Status 01/19/2015 FINAL  Final  MRSA PCR Screening     Status: None   Collection Time: 01/17/15  5:59 PM  Result Value Ref Range Status   MRSA by PCR NEGATIVE NEGATIVE Final    Comment:        The GeneXpert MRSA Assay (FDA approved for NASAL specimens only), is one component of a comprehensive MRSA colonization surveillance program. It is  not intended to diagnose MRSA infection nor to guide or monitor treatment for MRSA infections.     Coagulation Studies: No results for input(s): LABPROT, INR in the last 72 hours.  Urinalysis: No results for input(s): COLORURINE, LABSPEC, PHURINE, GLUCOSEU, HGBUR, BILIRUBINUR, KETONESUR, PROTEINUR, UROBILINOGEN, NITRITE, LEUKOCYTESUR in the last 72 hours.  Invalid input(s): APPERANCEUR    Imaging: Ct Head Wo Contrast  01/20/2015   CLINICAL DATA:  Sepsis, acute encephalopathy, difficulty speaking, history COPD, chronic kidney disease, hypertension, diabetes mellitus  EXAM: CT HEAD WITHOUT CONTRAST  TECHNIQUE: Contiguous axial images were obtained from the base of the skull through the vertex without intravenous contrast.  COMPARISON:  01/04/2015  FINDINGS: Beam hardening artifacts from metallic foreign bodies at the posterior RIGHT parietal region.  Generalized atrophy.  Normal ventricular morphology.  No midline shift or mass effect.  Small vessel chronic ischemic changes of deep cerebral white matter.  No intracranial hemorrhage, mass lesion, or evidence acute infarction.  No extra-axial fluid collections.  Sinuses clear and bones unremarkable.  IMPRESSION: Atrophy with small vessel chronic ischemic changes of deep cerebral white matter.  No acute intracranial abnormalities.   Electronically Signed   By: Lavonia Dana M.D.   On: 01/20/2015 09:39   US Renal  01/20/2015   CLINICAL DATA:  59 old male with acute renal failure. Subsequent encounter.  EXAM: RENAL / URINARY TRACT ULTRASOUND COMPLETE  COMPARISON:  01/06/2015 ultrasound.  05/21/2013 CT.  FINDINGS: Right Kidney:  Length: 9.2 cm.  No hydronephrosis.  No mass identified.  Left Kidney:  Length: 10.4 cm. No hydronephrosis. Left renal cysts are noted measure up to 1.2 cm. Evaluation is somewhat limited by patient not following directions and with limited mobility.  Bladder:  Foley catheter in place decompressed.  IMPRESSION: No  hydronephrosis.  Left renal cystic structures less well delineated on the present examination secondary to patient not able to hold breath or move.   Electronically Signed   By: Genia Del M.D.   On: 01/20/2015 18:09     Medications:     . antiseptic oral rinse  7 mL Mouth Rinse q12n4p  . aspirin EC  81 mg Oral Daily  . budesonide (PULMICORT) nebulizer solution  0.5 mg Nebulization BID  . chlorhexidine  15 mL Mouth Rinse BID  . cholecalciferol  1,000 Units Oral Daily  . docusate sodium  100 mg Oral BID  . dutasteride  0.5 mg Oral Daily  . heparin  5,000 Units Subcutaneous 3 times per day  . insulin aspart  0-9 Units Subcutaneous TID AC & HS  . magnesium oxide  400 mg Oral Daily  . methylPREDNISolone (SOLU-MEDROL) injection  60 mg Intravenous Q24H  . metoprolol  50 mg Oral BID  . piperacillin-tazobactam (ZOSYN)  IV  3.375 g Intravenous 3 times per day  . polyethylene glycol  17 g Oral q morning - 10a  . saccharomyces boulardii  250 mg Oral TID  . sodium chloride  3 mL Intravenous Q12H  . tamsulosin  0.4 mg Oral Daily  . vancomycin  1,000 mg Intravenous Q36H   sodium chloride, acetaminophen **OR** acetaminophen, albuterol, hydrALAZINE, ondansetron **OR** ondansetron (ZOFRAN) IV, sodium chloride  Assessment/ Plan:  80 y.o. Black male with peripheral vascular disease, COPD, chronic kidney disease stage III Baseline creatinine 1.5, diabetes mellitus, hypertension, anemia of chronic kidney disease, dementia, BPH, history of colon cancer, peripheral neuropathy, gastroparesis, multiple prior episodes of acute renal failure.    1. Acute renal failure/chronic kidney disease stage III Baseline creatinine 1.5/proteinuria. The patient has had prior episodes of acute renal failure. Negative spep/anca/gbm last admission, was hypothermic upon admission. Suspect that pt's acute renal failure is multifactorial now.  Was hypothermic suggesting a sepsis etiology, UA also indicative of UTI.   - Good  UOP of 2.4 liters, Cr down to 2.6, continue supportive care, no acute indication for HD.   2. Hypernatremia. Na now 146, encourage free water intake.  3. Anemia of chronic kidney disease: hgb 9.0    4. Sepsis with urinary tract infection:  Continued on zoysn and vancomycin.  - dosed per pharmacy  LOS: 5 Mystery Schrupp 7/9/20169:24 AM

## 2015-01-22 NOTE — Evaluation (Signed)
Physical Therapy Evaluation Patient Details Name: Demtrius Entsminger MRN: [id number] DOB: Mar 15, 1935 Today's Date: 01/22/2015   History of Present Illness  Pt here with sepsis, anemia  Clinical Impression  Pt repeatedly reports that he had been able to walk with walker, get out of his aunt's house, etc until the last week or two.  Pt is able to show relative awareness with orientation questions, but per his stiffness, mobility and attempt at standing during exam it seems implausible that he was as mobile as he reports?       Follow Up Recommendations SNF    Equipment Recommendations       Recommendations for Other Services       Precautions / Restrictions Precautions Precautions: Fall Restrictions Weight Bearing Restrictions: No      Mobility  Bed Mobility Overal bed mobility: Needs Assistance Bed Mobility: Supine to Sit     Supine to sit: Mod assist;Max assist     General bed mobility comments: Pt needing considerable assist getting to and maintaining sitting.    Transfers Overall transfer level: Needs assistance Equipment used: Rolling walker (2 wheeled) Transfers: Sit to/from Stand Sit to Stand: Max assist         General transfer comment: pt unable to get to fully upright despite heavy assist and cuing  Ambulation/Gait Ambulation/Gait assistance:  (unable)              Stairs            Wheelchair Mobility    Modified Rankin (Stroke Patients Only)       Balance                                             Pertinent Vitals/Pain Pain Assessment: No/denies pain    Home Living Family/patient expects to be discharged to::  (pt reports he lives with his aunt)                 Additional Comments: Pt states that he is able to walk with walker, use the bathroom/shower/etc w/o assist.     Prior Function Level of Independence: Needs assistance   Gait / Transfers Assistance Needed: states he uses a walker, but gets out  of the house weekly (today's performance does not suggest this)           Hand Dominance        Extremity/Trunk Assessment   Upper Extremity Assessment: Generalized weakness           Lower Extremity Assessment: Generalized weakness         Communication   Communication: No difficulties  Cognition Arousal/Alertness: Awake/alert Behavior During Therapy:  (distracted) Overall Cognitive Status: Difficult to assess                      General Comments      Exercises        Assessment/Plan    PT Assessment Patient needs continued PT services  PT Diagnosis Difficulty walking;Generalized weakness   PT Problem List Decreased range of motion;Decreased balance;Decreased mobility;Decreased activity tolerance;Decreased strength  PT Treatment Interventions Gait training;Therapeutic activities;Therapeutic exercise;Balance training;Functional mobility training   PT Goals (Current goals can be found in the Care Plan section) Acute Rehab PT Goals Patient Stated Goal: Pt does not state Time For Goal Achievement: 02/05/15    Frequency Min 2X/week   Barriers  to discharge        Co-evaluation               End of Session Equipment Utilized During Treatment: Gait belt               Time: 1740-1806 PT Time Calculation (min) (ACUTE ONLY): 26 min   Charges:   PT Evaluation $Initial PT Evaluation Tier I: 1 Procedure     PT G Codes:       Wayne Both, PT, DPT [phone]  Kreg Shropshire 01/22/2015, 6:38 PM

## 2015-01-22 NOTE — Progress Notes (Signed)
[facility] NAME: Alan Mcintyre    MR#:  [id number]  DATE OF BIRTH:  March 20, 1935  SUBJECTIVE:  CHIEF COMPLAINT:   Chief Complaint  Patient presents with  . Shortness of Breath    from nursing home with reported dyspnea   Admitted for AMS, hypothermia and Hypoxia. Off Bipap, off Bair  hugger More awake and alert. Speech is better. Patient has chronic dementia, according to the facility staff patient's speech is clear as reported by the RN. REVIEW OF SYSTEMS:    ROS  Unobtainable due to dementia  DRUG ALLERGIES:  No Known Allergies  VITALS:  Blood pressure 156/59, pulse 53, temperature 97.8 F (36.6 C), temperature source Oral, resp. rate 18, height 5\' 7"  (1.702 m), weight 89.631 kg (197 lb 9.6 oz), SpO2 100 %.  PHYSICAL EXAMINATION:   Physical Exam  GENERAL:  80 y.o.-year-old patient lying in the bed with no acute distress. Pale EYES: Pupils equal, round, reactive to light and accommodation. No scleral icterus. Extraocular muscles intact.  HEENT: Head atraumatic, normocephalic. Oropharynx and nasopharynx clear.  NECK:  Supple, no jugular venous distention. No thyroid enlargement, no tenderness.  LUNGS: Coarse BS. CARDIOVASCULAR: S1, S2 normal. No murmurs, rubs, or gallops.  ABDOMEN: Soft, nontender, nondistended. Bowel sounds present. No organomegaly or mass.  EXTREMITIES: No cyanosis, clubbing. 2 + edema LE. NEUROLOGIC: Cranial nerves II through XII are intact. No focal Motor or sensory deficits b/l.   PSYCHIATRIC: The patient is more alert SKIN: No obvious rash, lesion, or ulcer.    LABORATORY PANEL:   CBC  Recent Labs Lab 01/22/15 0522  WBC 8.5  HGB 9.0*  HCT 27.9*  PLT 264   ------------------------------------------------------------------------------------------------------------------  Chemistries   Recent Labs Lab 01/17/15 1116  01/22/15 0522 01/22/15 0816  NA 141  < > 146* 146*  K  4.6  < > 4.5  --   CL 112*  < > 113*  --   CO2 24  < > 27  --   GLUCOSE 197*  < > 182*  --   BUN 52*  < > 38*  --   CREATININE 3.29*  < > 2.62*  --   CALCIUM 8.9  < > 9.4  --   MG  --   < > 2.1  --   AST 25  --   --   --   ALT 26  --   --   --   ALKPHOS 87  --   --   --   BILITOT 0.4  --   --   --   < > = values in this interval not displayed. ------------------------------------------------------------------------------------------------------------------  Cardiac Enzymes  Recent Labs Lab 01/17/15 1116  TROPONINI <0.03   ------------------------------------------------------------------------------------------------------------------  RADIOLOGY:  US Renal  01/20/2015   CLINICAL DATA:  89 old male with acute renal failure. Subsequent encounter.  EXAM: RENAL / URINARY TRACT ULTRASOUND COMPLETE  COMPARISON:  01/06/2015 ultrasound.  05/21/2013 CT.  FINDINGS: Right Kidney:  Length: 9.2 cm.  No hydronephrosis.  No mass identified.  Left Kidney:  Length: 10.4 cm. No hydronephrosis. Left renal cysts are noted measure up to 1.2 cm. Evaluation is somewhat limited by patient not following directions and with limited mobility.  Bladder:  Foley catheter in place decompressed.  IMPRESSION: No hydronephrosis.  Left renal cystic structures less well delineated on the present examination secondary to patient not able to hold breath or move.   Electronically Signed  By: Genia Del M.D.   On: 01/20/2015 18:09     ASSESSMENT AND PLAN:   * Aspiration pneumonia On IV abx. Consult speech, recommends pured diet with nectar thick liquids  Follow up with pulmonary. BiPAP when necessary  * Acute respiratory failure O2 for sats > 90% Nebs PRN IV Solu-Medrol, taper steroids  * UTI On IV abx. Urine culture negative so far  * ARF over CKD3 D5-NS due to hypernatremia, sodium is better at 146 Follow-up with nephrology. I/Os.  * Severe sepsis Improving Blood cultures are negative  so far  * Acute encephalopathy Clinically better  *Dysarthria  CT negative head, no past medical history of strokes, but recent CT head with remote infarcts   neurology consult -recommends no further workup   *  Anemia of chronic disease with worsening Daughter agreed with blood transfusion.  transfused 2 units today and repeat CBC  Stool for Hemoccult  * Hypothermia due to sepsis Improved. Off Bair hugger  * COPD Nebs. steroids  * DM SSI  * Palliative care consult is placed, discussed with Dr. Megan Salon  Will consider transferring the patient to telemetry  Consult physical therapy for deconditioning  All the records are reviewed and case discussed with Care Management/Social Workerr. Management plans discussed with the patient, family and they are in agreement.    CODE STATUS: FULL CODE  DVT Prophylaxis: SCDs  TOTAL TIME TAKING CARE OF THIS PATIENT: 35 minutes.    Nicholes Mango M.D on 01/22/2015 at 2:32 PM  Between 7am to 6pm - Pager - [phone]  After 6pm go to [email or web address] - password [email or web address] [facility]  Office  [phone]  CC: Primary care physician; Lorelee Market, MD

## 2015-01-22 NOTE — Progress Notes (Signed)
Patient is alert and oriented to self. On room air with oxygen saturation WNL. Reporting no pain. Tolerated breakfast. Resting quietly between care. VSS. Transferr to 233, report called to [place], will transfer shortly.
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
## 2015-01-23 LAB — TYPE AND SCREEN
ABO/RH(D): O POS
Antibody Screen: NEGATIVE
Unit division: 0
Unit division: 0

## 2015-01-23 LAB — GLUCOSE, CAPILLARY
Glucose-Capillary: 89 mg/dL (ref 65–99)
Glucose-Capillary: 202 mg/dL — ABNORMAL HIGH (ref 65–99)
Glucose-Capillary: 293 mg/dL — ABNORMAL HIGH (ref 65–99)
Glucose-Capillary: 447 mg/dL — ABNORMAL HIGH (ref 65–99)

## 2015-01-23 LAB — BASIC METABOLIC PANEL
Anion gap: 7 (ref 5–15)
BUN: 36 mg/dL — ABNORMAL HIGH (ref 6–20)
CHLORIDE: 113 mmol/L — AB (ref 101–111)
CO2: 29 mmol/L (ref 22–32)
Calcium: 9.1 mg/dL (ref 8.9–10.3)
Creatinine, Ser: 2.68 mg/dL — ABNORMAL HIGH (ref 0.61–1.24)
GFR calc Af Amer: 25 mL/min — ABNORMAL LOW (ref >60)
GFR calc non Af Amer: 21 mL/min — ABNORMAL LOW (ref >60)
GLUCOSE: 94 mg/dL (ref 65–99)
POTASSIUM: 3.9 mmol/L (ref 3.5–5.1)
SODIUM: 149 mmol/L — AB (ref 135–145)

## 2015-01-23 LAB — MAGNESIUM: MAGNESIUM: 1.9 mg/dL (ref 1.7–2.4)

## 2015-01-23 LAB — PHOSPHORUS: PHOSPHORUS: 4.1 mg/dL (ref 2.5–4.6)

## 2015-01-23 LAB — SODIUM: SODIUM: 142 mmol/L (ref 135–145)

## 2015-01-23 LAB — VANCOMYCIN, TROUGH: Vancomycin Tr: 29 ug/mL (ref 10–20)

## 2015-01-23 NOTE — Progress Notes (Signed)
Patient alert and oriented x3, no complaints at this time. vss at this time. Patient NSR with 1st degree heart block on telemetry. Will continue to assess.  Alan Mcintyre

## 2015-01-23 NOTE — Progress Notes (Signed)
Subjective:  More awake and alert today. uop good- 1625 cc S Cr about the same Able to answer simple Qs   Objective:  Vital signs in last 24 hours:  Temp:  [97.9 F (36.6 C)-98.4 F (36.9 C)] 98.4 F (36.9 C) (07/10 0533) Pulse Rate:  [57-85] 57 (07/10 0533) Resp:  [20] 20 (07/10 0533) BP: (150-198)/(68-74) 198/68 mmHg (07/10 0533) SpO2:  [97 %-100 %] 98 % (07/10 0802)  Weight change: -1.069 kg (-2 lb 5.7 oz) Filed Weights   01/17/15 1037 01/22/15 0300 01/22/15 1005  Weight: 90.719 kg (200 lb) 90.7 kg (199 lb 15.3 oz) 89.631 kg (197 lb 9.6 oz)    Intake/Output: I/O last 3 completed shifts: In: 1045 [P.O.:220; I.V.:200; Other:275; IV Piggyback:350] Out: 2703 [Urine:2700; Stool:3]     Physical Exam: General: NAD  HEENT Moist mucus moist, eyes open  Neck supple  Pulm/lungs Clear bilateral, normal effort  CVS/Heart Regular, no rub  Abdomen:  Soft, non tender, BS present  Extremities: 1+ peripheral and dependent edema, heel ulcers  Neurologic: Awake, alert, following commands this AM  Skin: No acute rashes, scaly   Foley       Basic Metabolic Panel:  Recent Labs Lab 01/19/15 0449 01/20/15 0645  01/21/15 0616  01/21/15 2001 01/22/15 0522 01/22/15 0816 01/22/15 2121 01/23/15 0827  NA 147* 148*  < > 147*  < > 144 146* 146* 144 149*  K 5.0 4.5  --  4.4  --   --  4.5  --   --  3.9  CL 118* 117*  --  114*  --   --  113*  --   --  113*  CO2 25 26  --  26  --   --  27  --   --  29  GLUCOSE 138* 108*  --  103*  --   --  182*  --   --  94  BUN 46* 39*  --  38*  --   --  38*  --   --  36*  CREATININE 3.59* 3.26*  --  2.80*  --   --  2.62*  --   --  2.68*  CALCIUM 9.3 9.4  --  9.4  --   --  9.4  --   --  9.1  MG  --  2.4  --   --   --   --  2.1  --   --  1.9  PHOS  --  3.5  --   --   --   --  3.2  --   --  4.1  < > = values in this interval not displayed.   CBC:  Recent Labs Lab 01/17/15 1116  01/18/15 0624  01/19/15 0449 01/20/15 0903 01/21/15 0616  01/21/15 2001 01/22/15 0522  WBC 5.3  < > 5.3  --  5.6 6.9 6.8  --  8.5  NEUTROABS 3.9  --   --   --   --   --   --   --   --   HGB 7.3*  < > 6.6*  < > 7.2* 6.7* 6.5* 13.3 9.0*  HCT 23.1*  < > 21.2*  --  23.0* 21.0* 20.4* 45.9 27.9*  MCV 94.5  < > 93.9  --  94.4 92.0 90.9  --  88.4  PLT 150  < > 197  --  248 239 251  --  264  < > = values in this interval not displayed.  Microbiology: Results for orders placed or performed during the hospital encounter of 01/17/15  Blood Culture (routine x 2)     Status: None   Collection Time: 01/17/15 10:59 AM  Result Value Ref Range Status   Specimen Description BLOOD LEFT ARM  Final   Special Requests BOTTLES DRAWN AEROBIC AND ANAEROBIC .5 CC  Final   Culture NO GROWTH 5 DAYS  Final   Report Status 01/22/2015 FINAL  Final  Blood Culture (routine x 2)     Status: None   Collection Time: 01/17/15 11:11 AM  Result Value Ref Range Status   Specimen Description BLOOD LEFT HAND  Final   Special Requests BOTTLES DRAWN AEROBIC AND ANAEROBIC  Final   Culture NO GROWTH 5 DAYS  Final   Report Status 01/22/2015 FINAL  Final  Urine culture     Status: None   Collection Time: 01/17/15  1:20 PM  Result Value Ref Range Status   Specimen Description Urine  Final   Special Requests NONE  Final   Culture NO GROWTH 1 DAY  Final   Report Status 01/19/2015 FINAL  Final  Urine culture     Status: None   Collection Time: 01/17/15  5:58 PM  Result Value Ref Range Status   Specimen Description URINE, CLEAN CATCH  Final   Special Requests NONE  Final   Culture NO GROWTH 1 DAY  Final   Report Status 01/19/2015 FINAL  Final  MRSA PCR Screening     Status: None   Collection Time: 01/17/15  5:59 PM  Result Value Ref Range Status   MRSA by PCR NEGATIVE NEGATIVE Final    Comment:        The GeneXpert MRSA Assay (FDA approved for NASAL specimens only), is one component of a comprehensive MRSA colonization surveillance program. It is not intended to diagnose  MRSA infection nor to guide or monitor treatment for MRSA infections.     Coagulation Studies: No results for input(s): LABPROT, INR in the last 72 hours.  Urinalysis: No results for input(s): COLORURINE, LABSPEC, PHURINE, GLUCOSEU, HGBUR, BILIRUBINUR, KETONESUR, PROTEINUR, UROBILINOGEN, NITRITE, LEUKOCYTESUR in the last 72 hours.  Invalid input(s): APPERANCEUR    Imaging: No results found.   Medications:     . antiseptic oral rinse  7 mL Mouth Rinse q12n4p  . aspirin EC  81 mg Oral Daily  . budesonide (PULMICORT) nebulizer solution  0.5 mg Nebulization BID  . chlorhexidine  15 mL Mouth Rinse BID  . cholecalciferol  1,000 Units Oral Daily  . docusate sodium  100 mg Oral BID  . dutasteride  0.5 mg Oral Daily  . heparin  5,000 Units Subcutaneous 3 times per day  . insulin aspart  0-9 Units Subcutaneous TID AC & HS  . magnesium oxide  400 mg Oral Daily  . methylPREDNISolone (SOLU-MEDROL) injection  60 mg Intravenous Q24H  . metoprolol  50 mg Oral BID  . piperacillin-tazobactam (ZOSYN)  IV  3.375 g Intravenous 3 times per day  . polyethylene glycol  17 g Oral q morning - 10a  . saccharomyces boulardii  250 mg Oral TID  . sodium chloride  3 mL Intravenous Q12H  . tamsulosin  0.4 mg Oral Daily  . vancomycin  1,000 mg Intravenous Q36H   sodium chloride, acetaminophen **OR** acetaminophen, albuterol, hydrALAZINE, ondansetron **OR** ondansetron (ZOFRAN) IV, sodium chloride  Assessment/ Plan:  80 y.o. African [place] male with peripheral vascular disease, COPD, chronic kidney disease stage III Baseline creatinine 1.5, diabetes  mellitus, hypertension, anemia of chronic kidney disease, dementia, BPH, history of colon cancer, peripheral neuropathy, gastroparesis, multiple prior episodes of acute renal failure.    1. Acute renal failure/chronic kidney disease stage III Baseline creatinine 1.5/proteinuria. The patient has had prior episodes of acute renal failure. Negative  spep/anca/gbm last admission, was hypothermic upon admission. Suspect that pt's acute renal failure is multifactorial now.  Was hypothermic suggesting a sepsis etiology, UA also indicative of UTI.   - Good UOP, Cr down to 2.6, continue supportive care, no acute indication for HD.   2. Hypernatremia. Na now 149, encourage free water intake.  3. Anemia of chronic kidney disease: hgb 9.0    4. Sepsis with urinary tract infection:  Continued on zoysn and vancomycin.  - dosed per pharmacy  LOS: 6 Coco Sharpnack 7/10/201611:01 AM

## 2015-01-23 NOTE — Progress Notes (Signed)
Vancomycin Follow-Up: Patient currently ordered Vancomycin 1g IV q36h. Vancomycin trough reported at 29 mcg/ml. Trough level was drawn after Vancomycin infusion had started so cannot use level to determine dosing. Will continue with current regimen and recheck level prior to next dose. Renal function is decreased but appears stable.  Paulina Fusi, PharmD, BCPS 01/23/2015 7:42 PM

## 2015-01-23 NOTE — Progress Notes (Signed)
[name] [place] NAME: Alan Mcintyre    MR#:  [id number]  DATE OF BIRTH:  1935/07/01  SUBJECTIVE:  CHIEF COMPLAINT:   Chief Complaint  Patient presents with  . Shortness of Breath    from nursing home with reported dyspnea   Admitted for AMS, hypothermia and Hypoxia. Off Bipap, off Bair  hugger More awake and alert. Speech is better. Patient has chronic dementia REVIEW OF SYSTEMS:    ROS  Unobtainable due to dementia  DRUG ALLERGIES:  No Known Allergies  VITALS:  Blood pressure 182/78, pulse 79, temperature 98.2 F (36.8 C), temperature source Oral, resp. rate 18, height 5\' 7"  (1.702 m), weight 89.631 kg (197 lb 9.6 oz), SpO2 96 %.  PHYSICAL EXAMINATION:   Physical Exam  GENERAL:  80 y.o.-year-old patient lying in the bed with no acute distress. Pale EYES: Pupils equal, round, reactive to light and accommodation. No scleral icterus. Extraocular muscles intact.  HEENT: Head atraumatic, normocephalic. Oropharynx and nasopharynx clear.  NECK:  Supple, no jugular venous distention. No thyroid enlargement, no tenderness.  LUNGS: Coarse BS. CARDIOVASCULAR: S1, S2 normal. No murmurs, rubs, or gallops.  ABDOMEN: Soft, nontender, nondistended. Bowel sounds present. No organomegaly or mass.  EXTREMITIES: No cyanosis, clubbing. 2 + edema LE. NEUROLOGIC: Cranial nerves II through XII are intact. No focal Motor or sensory deficits b/l.   PSYCHIATRIC: The patient is more alert SKIN: No obvious rash, lesion, or ulcer.    LABORATORY PANEL:   CBC  Recent Labs Lab 01/22/15 0522  WBC 8.5  HGB 9.0*  HCT 27.9*  PLT 264   ------------------------------------------------------------------------------------------------------------------  Chemistries   Recent Labs Lab 01/17/15 1116  01/23/15 0827 01/23/15 2109  NA 141  < > 149* 142  K 4.6  < > 3.9  --   CL 112*  < > 113*  --   CO2 24  < > 29  --   GLUCOSE 197*  < >  94  --   BUN 52*  < > 36*  --   CREATININE 3.29*  < > 2.68*  --   CALCIUM 8.9  < > 9.1  --   MG  --   < > 1.9  --   AST 25  --   --   --   ALT 26  --   --   --   ALKPHOS 87  --   --   --   BILITOT 0.4  --   --   --   < > = values in this interval not displayed. ------------------------------------------------------------------------------------------------------------------  Cardiac Enzymes  Recent Labs Lab 01/17/15 1116  TROPONINI <0.03   ------------------------------------------------------------------------------------------------------------------  RADIOLOGY:  No results found.   ASSESSMENT AND PLAN:   * Aspiration pneumonia On IV abx. Consult speech, recommends pured diet with nectar thick liquids  Follow up with pulmonary. BiPAP when necessary  * Acute respiratory failure O2 for sats > 90% Nebs PRN IV Solu-Medrol, taper steroids  * UTI On IV abx. Urine culture negative so far  * ARF over CKD3 D5-NS due to hypernatremia, sodium is better at 146 Follow-up with nephrology. I/Os.  * Severe sepsis Improving Blood cultures are negative so far  * Acute encephalopathy Clinically better  *Dysarthria  CT negative head, no past medical history of strokes, but recent CT head with remote infarcts   neurology consult -recommends no further workup   *  Anemia of chronic disease with worsening Daughter agreed  with blood transfusion.  transfused 2 units today and repeat CBC  Stool for Hemoccult  * Hypothermia due to sepsis Improved. Off Bair hugger  * COPD Nebs. steroids  * DM SSI  * Palliative care consult is placed, discussed with Dr. Megan Salon.   Consult physical therapy for deconditioning  All the records are reviewed and case discussed with Care Management/Social Workerr. Management plans discussed with the patient, family and they are in agreement.    CODE STATUS: FULL CODE  DVT Prophylaxis: SCDs  TOTAL TIME TAKING CARE OF THIS PATIENT:  35 minutes.    Nicholes Mango M.D on 01/23/2015 at 10:12 PM  Between 7am to 6pm - Pager - [phone]  After 6pm go to [email or web address] - password [email or web address] [facility]  Office  [phone]  CC: Primary care physician; Lorelee Market, MD

## 2015-01-24 LAB — SODIUM
Sodium: 146 mmol/L — ABNORMAL HIGH (ref 135–145)
Sodium: 143 mmol/L (ref 135–145)

## 2015-01-24 LAB — RENAL FUNCTION PANEL
ANION GAP: 12 (ref 5–15)
Albumin: 2.9 g/dL — ABNORMAL LOW (ref 3.5–5.0)
BUN: 35 mg/dL — ABNORMAL HIGH (ref 6–20)
CALCIUM: 8.7 mg/dL — AB (ref 8.9–10.3)
CHLORIDE: 106 mmol/L (ref 101–111)
CO2: 25 mmol/L (ref 22–32)
CREATININE: 2.88 mg/dL — AB (ref 0.61–1.24)
GFR calc non Af Amer: 20 mL/min — ABNORMAL LOW (ref >60)
GFR, EST AFRICAN AMERICAN: 23 mL/min — AB (ref >60)
GLUCOSE: 456 mg/dL — AB (ref 65–99)
POTASSIUM: 5.1 mmol/L (ref 3.5–5.1)
Phosphorus: 3.2 mg/dL (ref 2.5–4.6)
SODIUM: 143 mmol/L (ref 135–145)

## 2015-01-24 LAB — CLOSTRIDIUM DIFFICILE BY PCR: CDIFFPCR: NEGATIVE

## 2015-01-24 LAB — GLUCOSE, CAPILLARY
GLUCOSE-CAPILLARY: 410 mg/dL — AB (ref 65–99)
Glucose-Capillary: 234 mg/dL — ABNORMAL HIGH (ref 65–99)
Glucose-Capillary: 111 mg/dL — ABNORMAL HIGH (ref 65–99)
Glucose-Capillary: 201 mg/dL — ABNORMAL HIGH (ref 65–99)
Glucose-Capillary: 258 mg/dL — ABNORMAL HIGH (ref 65–99)

## 2015-01-24 LAB — C DIFFICILE QUICK SCREEN W PCR REFLEX
C Diff antigen: POSITIVE
C Diff toxin: NEGATIVE

## 2015-01-24 MED ORDER — PREDNISONE 50 MG PO TABS
50.0000 mg | ORAL_TABLET | Freq: Every day | ORAL | Status: DC
  Administered 2015-01-25 – 2015-01-27 (×3): 50 mg via ORAL
  Filled 2015-01-24 (×3): qty 1

## 2015-01-24 NOTE — Progress Notes (Signed)
Patient alert and oriented x3, no complaints at this time. vss at this time. Patient NSR on telemetry. Will continue to assess. Charmine Bockrath R [place]  

## 2015-01-24 NOTE — Progress Notes (Signed)
Speech Language Pathology Treatment: Dysphagia  Patient Details Name: Alan Mcintyre MRN: [id number] DOB: 16-Jun-1935 Today's Date: 01/24/2015 Time: [id number] SLP Time Calculation (min) (ACUTE ONLY): 42 min  Assessment / Plan / Recommendation Clinical Impression  Pt appears at reduced risk for aspiration following aspiration precautions of eating/drinking slowly and taking smaller biters and sips - clearing mouth fully b/f taking another bite. Suspect this decreased awareness and self-monitoring is related to pt's baseline dx of Dementia. Rec. Continue w/ current dysphagia diet at this time as pt's medical status continues to improve. This diet consistency may be most beneficial to him at discharge as well to reduce risk for aspiration sec. to his declined Cognitive status. Education on strategies and aspiration precautions given to pt; posted in room. NSG updated. ST will continue to f/u next 2-3 days for toleration of diet while admitted.    HPI Other Pertinent Information: Pt admitted w/ sepsis; UTI; acute metabolic encephalopathy. Hypothermia; improved. Pt is more awake/alert than at eval and is now feeding self during meals. Pt tends to eat w/ some impulsivity putting larger bites in his mouth. Pt has dxs of diabetic gastroparesis and Dementia per chart   Pertinent Vitals Pain Assessment: No/denies pain  SLP Plan  Continue with current plan of care    Recommendations Diet recommendations: Dysphagia 1 (puree);Nectar-thick liquid Liquids provided via: Cup;Straw Medication Administration: Crushed with puree Supervision: Patient able to self feed;Full supervision/cueing for compensatory strategies (monitor for impulsive feeding behaviors) Compensations: Slow rate;Small sips/bites Postural Changes and/or Swallow Maneuvers: Seated upright 90 degrees              Oral Care Recommendations: Oral care BID;Oral care before and after PO;Staff/trained caregiver to provide oral care Follow up  Recommendations: Skilled Nursing facility Plan: Continue with current plan of care    GO     Zela Sobieski 01/24/2015, 1:56 PM

## 2015-01-24 NOTE — Progress Notes (Signed)
Pt glucose greater than 400, MD [name] notified.

## 2015-01-24 NOTE — Progress Notes (Signed)
[facility] [place] NAME: Alan Mcintyre    MR#:  [id number]  DATE OF BIRTH:  May 13, 1935  SUBJECTIVE:  CHIEF COMPLAINT:   Chief Complaint  Patient presents with  . Shortness of Breath    from nursing home with reported dyspnea   Admitted for AMS, hypothermia and Hypoxia. Several Soft BM's  Patient has chronic dementia REVIEW OF SYSTEMS:    ROS  Unobtainable due to dementia  DRUG ALLERGIES:  No Known Allergies  VITALS:  Blood pressure 163/71, pulse 70, temperature 98.1 F (36.7 C), temperature source Oral, resp. rate 22, height 5\' 7"  (1.702 m), weight 89.631 kg (197 lb 9.6 oz), SpO2 100 %.  PHYSICAL EXAMINATION:   Physical Exam  GENERAL:  80 y.o.-year-old patient lying in the bed with no acute distress. Pale EYES: Pupils equal, round, reactive to light and accommodation. No scleral icterus. Extraocular muscles intact.  HEENT: Head atraumatic, normocephalic. Oropharynx and nasopharynx clear.  NECK:  Supple, no jugular venous distention. No thyroid enlargement, no tenderness.  LUNGS: Coarse BS. CARDIOVASCULAR: S1, S2 normal. No murmurs, rubs, or gallops.  ABDOMEN: Soft, nontender, nondistended. Bowel sounds present. No organomegaly or mass.  EXTREMITIES: No cyanosis, clubbing. 2 + edema LE. NEUROLOGIC: Cranial nerves II through XII are intact. No focal Motor or sensory deficits b/l.   PSYCHIATRIC: The patient is more alert SKIN: No obvious rash, lesion, or ulcer.    LABORATORY PANEL:   CBC  Recent Labs Lab 01/22/15 0522  WBC 8.5  HGB 9.0*  HCT 27.9*  PLT 264   ------------------------------------------------------------------------------------------------------------------  Chemistries   Recent Labs Lab 01/23/15 0827  01/24/15 0835  NA 149*  < > 146*  K 3.9  --   --   CL 113*  --   --   CO2 29  --   --   GLUCOSE 94  --   --   BUN 36*  --   --   CREATININE 2.68*  --   --   CALCIUM 9.1  --   --    MG 1.9  --   --   < > = values in this interval not displayed. ------------------------------------------------------------------------------------------------------------------  Cardiac Enzymes No results for input(s): TROPONINI in the last 168 hours. ------------------------------------------------------------------------------------------------------------------  RADIOLOGY:  No results found.   ASSESSMENT AND PLAN:   * Aspiration pneumonia On IV abx for 8 days, d/ced abx Consult speech, recommends pured diet with nectar thick liquids   BiPAP when necessary  * Acute respiratory failure O2 for sats > 90% Nebs PRN IV Solu-Medrol, taper steroids  * UTI On IV abx. Urine culture negative so far  * ARF over CKD3 D5-NS due to hypernatremia, sodium is better at 146 Follow-up with nephrology. I/Os. D/C Foley cath BMP am  * Severe sepsis Improving Blood cultures are negative so far  * Acute encephalopathy Clinically better  *Dysarthria  CT negative head, no past medical history of strokes, but recent CT head with remote infarcts   neurology consult -recommends no further workup   *  Anemia of chronic disease with worsening Daughter agreed with blood transfusion.  transfused 2 units today and repeat CBC  Stool for Hemoccult  * Hypothermia due to sepsis Improved. Off Bair hugger  * COPD Nebs. steroids  * DM SSI  * Palliative care consult    Consult physical therapy - recommends SNF  All the records are reviewed and case discussed with Care Management/Social Workerr. Management plans discussed  with the patient, family and they are in agreement.    CODE STATUS: FULL CODE  DVT Prophylaxis: SCDs  TOTAL TIME TAKING CARE OF THIS PATIENT: 35 minutes.    Nicholes Mango M.D on 01/24/2015 at 6:10 PM  Between 7am to 6pm - Pager - [phone]  After 6pm go to [email or web address] - password [email or web address] [facility]  Office   [phone]  CC: Primary care physician; Lorelee Market, MD

## 2015-01-24 NOTE — Progress Notes (Signed)
Inpatient Diabetes Program Recommendations  AACE/ADA: New Consensus Statement on Inpatient Glycemic Control (2013)  Target Ranges:  Prepandial:   less than 140 mg/dL      Peak postprandial:   less than 180 mg/dL (1-2 hours)      Critically ill patients:  140 - 180 mg/dL   Results for Alan Mcintyre, Alan Mcintyre (MRN [id number]) as of 01/24/2015 10:40  Ref. Range 01/23/2015 07:28 01/23/2015 11:31 01/23/2015 16:15 01/23/2015 21:07 01/24/2015 00:38 01/24/2015 08:09  Glucose-Capillary Latest Ref Range: 65-99 mg/dL 89 202 (H) 293 (H) 447 (H) 234 (H) 111 (H)    Diabetes history: DM2 Outpatient Diabetes medications: Levemir 20 units QHS, Novolog sliding scale Current orders for Inpatient glycemic control: Novolog 0-9 units ACHS  Inpatient Diabetes Program Recommendations Correction (SSI): Please consider increasing Novolog correction to moderate scale. Insulin - Meal Coverage: While inpatient and ordered steroids, please consider ordering Novolog 5 units TID with meals for meal coverage (in addition to Novolog correction).  Thanks, Barnie Alderman, RN, MSN, CCRN, CDE Diabetes Coordinator Inpatient Diabetes Program [phone] (Team Pager from [place] to [place]) [phone] ([facility] office) [phone] [facility] office) [phone] [facility] office)

## 2015-01-24 NOTE — Progress Notes (Signed)
Subjective:  More awake and alert today. uop good- 1450 cc S Cr improved a little. No new labs today Able to answer simple Qs Ate 100 % breatkfast   Objective:  Vital signs in last 24 hours:  Temp:  [98.2 F (36.8 C)-98.4 F (36.9 C)] 98.3 F (36.8 C) (07/11 0537) Pulse Rate:  [55-84] 74 (07/11 0859) Resp:  [18] 18 (07/11 0537) BP: (168-182)/(67-85) 179/75 mmHg (07/11 0859) SpO2:  [96 %-100 %] 99 % (07/11 0752)  Weight change:  Filed Weights   01/17/15 1037 01/22/15 0300 01/22/15 1005  Weight: 90.719 kg (200 lb) 90.7 kg (199 lb 15.3 oz) 89.631 kg (197 lb 9.6 oz)    Intake/Output: I/O last 3 completed shifts: In: 1000 [P.O.:600; IV Piggyback:400] Out: 2450 [Urine:2450]     Physical Exam: General: NAD  HEENT Moist mucus moist, eyes open  Neck supple  Pulm/lungs Clear bilateral, normal effort  CVS/Heart Regular, no rub  Abdomen:  Soft, non tender, BS present  Extremities: 1+ peripheral and dependent edema, heel ulcers  Neurologic: Awake, alert, following commands this AM  Skin: No acute rashes, scaly   Foley       Basic Metabolic Panel:  Recent Labs Lab 01/19/15 0449 01/20/15 0645  01/21/15 [id number]  01/22/15 0522 01/22/15 0816 01/22/15 2121 01/23/15 0827 01/23/15 2109 01/24/15 0835  NA 147* 148*  < > 147*  < > 146* 146* 144 149* 142 146*  K 5.0 4.5  --  4.4  --  4.5  --   --  3.9  --   --   CL 118* 117*  --  114*  --  113*  --   --  113*  --   --   CO2 25 26  --  26  --  27  --   --  29  --   --   GLUCOSE 138* 108*  --  103*  --  182*  --   --  94  --   --   BUN 46* 39*  --  38*  --  38*  --   --  36*  --   --   CREATININE 3.59* 3.26*  --  2.80*  --  2.62*  --   --  2.68*  --   --   CALCIUM 9.3 9.4  --  9.4  --  9.4  --   --  9.1  --   --   MG  --  2.4  --   --   --  2.1  --   --  1.9  --   --   PHOS  --  3.5  --   --   --  3.2  --   --  4.1  --   --   < > = values in this interval not displayed.   CBC:  Recent Labs Lab 01/17/15 1116   01/18/15 0624  01/19/15 0449 01/20/15 0903 01/21/15 0616 01/21/15 2001 01/22/15 0522  WBC 5.3  < > 5.3  --  5.6 6.9 6.8  --  8.5  NEUTROABS 3.9  --   --   --   --   --   --   --   --   HGB 7.3*  < > 6.6*  < > 7.2* 6.7* 6.5* 13.3 9.0*  HCT 23.1*  < > 21.2*  --  23.0* 21.0* 20.4* 45.9 27.9*  MCV 94.5  < > 93.9  --  94.4 92.0 90.9  --  88.4  PLT 150  < > 197  --  248 239 251  --  264  < > = values in this interval not displayed.    Microbiology: Results for orders placed or performed during the hospital encounter of 01/17/15  Blood Culture (routine x 2)     Status: None   Collection Time: 01/17/15 10:59 AM  Result Value Ref Range Status   Specimen Description BLOOD LEFT ARM  Final   Special Requests BOTTLES DRAWN AEROBIC AND ANAEROBIC .5 CC  Final   Culture NO GROWTH 5 DAYS  Final   Report Status 01/22/2015 FINAL  Final  Blood Culture (routine x 2)     Status: None   Collection Time: 01/17/15 11:11 AM  Result Value Ref Range Status   Specimen Description BLOOD LEFT HAND  Final   Special Requests BOTTLES DRAWN AEROBIC AND ANAEROBIC  Final   Culture NO GROWTH 5 DAYS  Final   Report Status 01/22/2015 FINAL  Final  Urine culture     Status: None   Collection Time: 01/17/15  1:20 PM  Result Value Ref Range Status   Specimen Description Urine  Final   Special Requests NONE  Final   Culture NO GROWTH 1 DAY  Final   Report Status 01/19/2015 FINAL  Final  Urine culture     Status: None   Collection Time: 01/17/15  5:58 PM  Result Value Ref Range Status   Specimen Description URINE, CLEAN CATCH  Final   Special Requests NONE  Final   Culture NO GROWTH 1 DAY  Final   Report Status 01/19/2015 FINAL  Final  MRSA PCR Screening     Status: None   Collection Time: 01/17/15  5:59 PM  Result Value Ref Range Status   MRSA by PCR NEGATIVE NEGATIVE Final    Comment:        The GeneXpert MRSA Assay (FDA approved for NASAL specimens only), is one component of a comprehensive MRSA  colonization surveillance program. It is not intended to diagnose MRSA infection nor to guide or monitor treatment for MRSA infections.     Coagulation Studies: No results for input(s): LABPROT, INR in the last 72 hours.  Urinalysis: No results for input(s): COLORURINE, LABSPEC, PHURINE, GLUCOSEU, HGBUR, BILIRUBINUR, KETONESUR, PROTEINUR, UROBILINOGEN, NITRITE, LEUKOCYTESUR in the last 72 hours.  Invalid input(s): APPERANCEUR    Imaging: No results found.   Medications:     . antiseptic oral rinse  7 mL Mouth Rinse q12n4p  . aspirin EC  81 mg Oral Daily  . budesonide (PULMICORT) nebulizer solution  0.5 mg Nebulization BID  . chlorhexidine  15 mL Mouth Rinse BID  . cholecalciferol  1,000 Units Oral Daily  . docusate sodium  100 mg Oral BID  . dutasteride  0.5 mg Oral Daily  . heparin  5,000 Units Subcutaneous 3 times per day  . insulin aspart  0-9 Units Subcutaneous TID AC & HS  . magnesium oxide  400 mg Oral Daily  . methylPREDNISolone (SOLU-MEDROL) injection  60 mg Intravenous Q24H  . metoprolol  50 mg Oral BID  . piperacillin-tazobactam (ZOSYN)  IV  3.375 g Intravenous 3 times per day  . polyethylene glycol  17 g Oral q morning - 10a  . saccharomyces boulardii  250 mg Oral TID  . sodium chloride  3 mL Intravenous Q12H  . tamsulosin  0.4 mg Oral Daily  . vancomycin  1,000 mg Intravenous Q36H   sodium chloride, acetaminophen **OR** acetaminophen, albuterol,  hydrALAZINE, ondansetron **OR** ondansetron (ZOFRAN) IV, sodium chloride  Assessment/ Plan:  80 y.o. Black male with peripheral vascular disease, COPD, chronic kidney disease stage III Baseline creatinine 1.5, diabetes mellitus, hypertension, anemia of chronic kidney disease, dementia, BPH, history of colon cancer, peripheral neuropathy, gastroparesis, multiple prior episodes of acute renal failure.    1. Acute renal failure/chronic kidney disease stage III Baseline creatinine 1.5/proteinuria. The patient has had  prior episodes of acute renal failure. Negative spep/anca/gbm last admission, was hypothermic upon admission. Suspect that pt's acute renal failure is multifactorial now.  Was hypothermic suggesting a sepsis etiology, UA also indicative of UTI.   - Good UOP,  Cr down to 2.6, continue supportive care, no acute indication for HD.  - Consider removing foley  2. Hypernatremia. Na now 146, encourage free water intake.  3. Anemia of chronic kidney disease: hgb 9.0    4. Sepsis with urinary tract infection:  Continued on zoysn and vancomycin.  - dosed per pharmacy  LOS: 7 Adem Costlow 7/11/201610:18 AM

## 2015-01-24 NOTE — Clinical Social Work Note (Signed)
Patient with dementia and in and out of confusion. CSW had message to contact patient's daughter: Alan Mcintyre: [phone] due to her requesting rehab placement. PT has also recommended rehab placement. CSW spoke with patient's daughter on the phone and she confirmed she wanted rehab placement and has chosen [facility]. CSW has notified [facility] of bed acceptance and provided patient's daughter's name and number. [name] MSW,LCSWA [phone]

## 2015-01-24 NOTE — Progress Notes (Signed)
Verified c.diff results with micro lab. Patient PCR negative for c.diff. Isolation precautions discontinued.  Toniann Ket

## 2015-01-24 NOTE — Care Management (Signed)
Important Message  Patient Details  Name: Alan Mcintyre MRN: [id number] Date of Birth: 1935/02/23   Medicare Important Message Given:  Yes-third notification given    Katrina Stack, RN 01/24/2015, 11:44 AM

## 2015-01-24 NOTE — Plan of Care (Signed)
Problem: SLP Dysphagia Goals Goal: Misc Dysphagia Goal Pt will safely tolerate po diet of least restrictive consistency w/ no overt s/s of aspiration noted by Staff/pt/family x3 sessions.    

## 2015-01-24 NOTE — Progress Notes (Signed)
Per Dr. Margaretmary Eddy, okay to remove foley today. Toniann Ket

## 2015-01-24 NOTE — Care Management (Signed)
Informed by CSW that patient's daughter is requesting that patient not return to assisted living facility.  It is anticipated that patient will discharge to a skilled facility instead of [facility]

## 2015-01-25 ENCOUNTER — Inpatient Hospital Stay: Payer: Medicare Other

## 2015-01-25 LAB — BASIC METABOLIC PANEL
ANION GAP: 6 (ref 5–15)
BUN: 30 mg/dL — ABNORMAL HIGH (ref 6–20)
CHLORIDE: 109 mmol/L (ref 101–111)
CO2: 30 mmol/L (ref 22–32)
Calcium: 8.9 mg/dL (ref 8.9–10.3)
Creatinine, Ser: 2.55 mg/dL — ABNORMAL HIGH (ref 0.61–1.24)
GFR calc Af Amer: 26 mL/min — ABNORMAL LOW (ref >60)
GFR calc non Af Amer: 23 mL/min — ABNORMAL LOW (ref >60)
Glucose, Bld: 197 mg/dL — ABNORMAL HIGH (ref 65–99)
Potassium: 3.7 mmol/L (ref 3.5–5.1)
Sodium: 145 mmol/L (ref 135–145)

## 2015-01-25 LAB — SODIUM: Sodium: 148 mmol/L — ABNORMAL HIGH (ref 135–145)

## 2015-01-25 LAB — GLUCOSE, CAPILLARY
GLUCOSE-CAPILLARY: 439 mg/dL — AB (ref 65–99)
GLUCOSE-CAPILLARY: 157 mg/dL — AB (ref 65–99)
GLUCOSE-CAPILLARY: 100 mg/dL — AB (ref 65–99)
GLUCOSE-CAPILLARY: 310 mg/dL — AB (ref 65–99)
GLUCOSE-CAPILLARY: 454 mg/dL — AB (ref 65–99)
Glucose-Capillary: 304 mg/dL — ABNORMAL HIGH (ref 65–99)
Glucose-Capillary: 354 mg/dL — ABNORMAL HIGH (ref 65–99)

## 2015-01-25 LAB — CBC
HCT: 30.1 % — ABNORMAL LOW (ref 40.0–52.0)
Hemoglobin: 9.7 g/dL — ABNORMAL LOW (ref 13.0–18.0)
MCH: 28.4 pg (ref 26.0–34.0)
MCHC: 32 g/dL (ref 32.0–36.0)
MCV: 88.6 fL (ref 80.0–100.0)
Platelets: 251 10*3/uL (ref 150–440)
RBC: 3.4 MIL/uL — ABNORMAL LOW (ref 4.40–5.90)
RDW: 18.1 % — ABNORMAL HIGH (ref 11.5–14.5)
WBC: 10.2 10*3/uL (ref 3.8–10.6)

## 2015-01-25 LAB — OCCULT BLOOD X 1 CARD TO LAB, STOOL: FECAL OCCULT BLD: POSITIVE — AB

## 2015-01-25 LAB — GLUCOSE, RANDOM: Glucose, Bld: 444 mg/dL — ABNORMAL HIGH (ref 65–99)

## 2015-01-25 MED ORDER — INSULIN ASPART 100 UNIT/ML ~~LOC~~ SOLN
0.0000 [IU] | Freq: Three times a day (TID) | SUBCUTANEOUS | Status: DC
Start: 2015-01-25 — End: 2015-01-27
  Administered 2015-01-25: 15 [IU] via SUBCUTANEOUS
  Administered 2015-01-26: 11 [IU] via SUBCUTANEOUS
  Administered 2015-01-26: 5 [IU] via SUBCUTANEOUS
  Administered 2015-01-27: 3 [IU] via SUBCUTANEOUS
  Filled 2015-01-25: qty 11
  Filled 2015-01-25: qty 3
  Filled 2015-01-25: qty 15
  Filled 2015-01-25: qty 5

## 2015-01-25 MED ORDER — ONDANSETRON HCL 4 MG/2ML IJ SOLN
4.0000 mg | Freq: Four times a day (QID) | INTRAMUSCULAR | Status: DC
Start: 2015-01-25 — End: 2015-01-25

## 2015-01-25 MED ORDER — IPRATROPIUM-ALBUTEROL 0.5-2.5 (3) MG/3ML IN SOLN
3.0000 mL | Freq: Four times a day (QID) | RESPIRATORY_TRACT | Status: DC
  Administered 2015-01-25 – 2015-01-26 (×6): 3 mL via RESPIRATORY_TRACT
  Filled 2015-01-25 (×6): qty 3

## 2015-01-25 MED ORDER — BENZONATATE 100 MG PO CAPS: 200.0000 mg | ORAL_CAPSULE | Freq: Three times a day (TID) | ORAL | Status: DC | PRN

## 2015-01-25 MED ORDER — INSULIN ASPART 100 UNIT/ML ~~LOC~~ SOLN
5.0000 [IU] | Freq: Three times a day (TID) | SUBCUTANEOUS | Status: DC
  Administered 2015-01-25 – 2015-01-27 (×5): 5 [IU] via SUBCUTANEOUS
  Filled 2015-01-25 (×5): qty 5

## 2015-01-25 MED ORDER — INSULIN DETEMIR 100 UNIT/ML ~~LOC~~ SOLN
20.0000 [IU] | Freq: Every day | SUBCUTANEOUS | Status: DC
  Administered 2015-01-25 – 2015-01-26 (×2): 20 [IU] via SUBCUTANEOUS
  Filled 2015-01-25 (×3): qty 0.2

## 2015-01-25 MED ORDER — METHYLPREDNISOLONE SODIUM SUCC 40 MG IJ SOLR
40.0000 mg | Freq: Once | INTRAMUSCULAR | Status: AC
Start: 2015-01-25 — End: 2015-01-25
  Administered 2015-01-25: 40 mg via INTRAVENOUS
  Filled 2015-01-25: qty 1

## 2015-01-25 NOTE — Progress Notes (Signed)
[place] [facility] [place] NAME: Alan Mcintyre    MR#:  [id number]  DATE OF BIRTH:  07-21-1934  SUBJECTIVE:  CHIEF COMPLAINT:   Chief Complaint  Patient presents with  . Shortness of Breath    from nursing home with reported dyspnea   Admitted for AMS, hypothermia and Hypoxia. Several Soft BM's , stool for occult blood is positive. Diffusely wheezing today, Patient has chronic dementia REVIEW OF SYSTEMS:    ROS  Unobtainable due to dementia  DRUG ALLERGIES:  No Known Allergies  VITALS:  Blood pressure 196/92, pulse 61, temperature 97.5 F (36.4 C), temperature source Oral, resp. rate 23, height 5\' 7"  (1.702 m), weight 89.631 kg (197 lb 9.6 oz), SpO2 97 %.  PHYSICAL EXAMINATION:   Physical Exam  GENERAL:  80 y.o.-year-old patient lying in the bed with no acute distress.  EYES: Pupils equal, round, reactive to light and accommodation. No scleral icterus. Extraocular muscles intact.  HEENT: Head atraumatic, normocephalic. Oropharynx and nasopharynx clear.  NECK:  Supple, no jugular venous distention. No thyroid enlargement, no tenderness.  LUNGS: Coarse BS., Diffuse wheezing. No accessory muscle usage CARDIOVASCULAR: S1, S2 normal. No murmurs, rubs, or gallops.  ABDOMEN: Soft, nontender, nondistended. Bowel sounds present. No organomegaly or mass.  EXTREMITIES: No cyanosis, clubbing. 2 + edema LE. NEUROLOGIC: Cranial nerves II through XII are intact. No focal Motor or sensory deficits b/l.   PSYCHIATRIC: The patient is  alert SKIN: No obvious rash, lesion, or ulcer.    LABORATORY PANEL:   CBC  Recent Labs Lab 01/25/15 1021  WBC 10.2  HGB 9.7*  HCT 30.1*  PLT 251   ------------------------------------------------------------------------------------------------------------------  Chemistries   Recent Labs Lab 01/23/15 0827  01/25/15 1021  NA 149*  < > 145  K 3.9  < > 3.7  CL 113*  < > 109  CO2 29  < > 30   GLUCOSE 94  < > 197*  BUN 36*  < > 30*  CREATININE 2.68*  < > 2.55*  CALCIUM 9.1  < > 8.9  MG 1.9  --   --   < > = values in this interval not displayed. ------------------------------------------------------------------------------------------------------------------  Cardiac Enzymes No results for input(s): TROPONINI in the last 168 hours. ------------------------------------------------------------------------------------------------------------------  RADIOLOGY:  No results found.   ASSESSMENT AND PLAN:   * Aspiration pneumonia On IV abx for 8 days, d/ced abx Consult speech, recommends pured diet with nectar thick liquids   BiPAP when necessary  * Acute respiratory failure 2/2 COPD exacerbation O2 for sats > 90% Nebs PRN IV Solu-Medrol, taper steroids Repeat chest x-ray  * UTI Completed IV abx. Urine culture negative   * ARF over CKD3 D5-NS due to hypernatremia, sodium is better at 146 Follow-up with nephrology. I/Os. D/Ced  Foley cath BMP am  * Severe sepsis Improving Blood cultures are negative   * Acute encephalopathy Clinically better  *Dysarthria  CT negative head, no past medical history of strokes, but recent CT head with remote infarcts   neurology consult -recommends no further workup   *  Anemia of chronic disease with worsening Daughter agreed with blood transfusion.  transfused 2 units today and repeat CBC  Stool for Hemoccult  * Hypothermia due to sepsis Improved. Off Bair hugger  * COPD Nebs. steroids  * DM SSI     Consult physical therapy - recommends SNF  All the records are reviewed and case discussed with Care Management/Social Workerr. Management  plans discussed with the patient, family and they are in agreement.    CODE STATUS: FULL CODE  DVT Prophylaxis: SCDs  TOTAL TIME TAKING CARE OF THIS PATIENT: 35 minutes.   Anticipated discharge in a.m to skilled nursing facility. if clinically stable   Nicholes Mango  M.D on 01/25/2015 at 1:26 PM  Between 7am to 6pm - Pager - [phone]  After 6pm go to [email or web address] - password [email or web address] [facility]  Office  [phone]  CC: Primary care physician; Lorelee Market, MD

## 2015-01-25 NOTE — Progress Notes (Signed)
Subjective:  clinically about the same Able to answer simple Qs Denies any acute SOB   Objective:  Vital signs in last 24 hours:  Temp:  [97.5 F (36.4 C)-99.5 F (37.5 C)] 97.5 F (36.4 C) (07/12 0441) Pulse Rate:  [66-74] 66 (07/12 0640) Resp:  [19-22] 20 (07/12 0441) BP: (153-184)/(71-88) 153/83 mmHg (07/12 0640) SpO2:  [98 %-100 %] 100 % (07/12 0739)  Weight change:  Filed Weights   01/17/15 1037 01/22/15 0300 01/22/15 1005  Weight: 90.719 kg (200 lb) 90.7 kg (199 lb 15.3 oz) 89.631 kg (197 lb 9.6 oz)    Intake/Output: I/O last 3 completed shifts: In: 46 [P.O.:240; I.V.:40; IV Piggyback:150] Out: 2450 [Urine:2450]     Physical Exam: General: NAD  HEENT Moist mucus moist, eyes open  Neck supple  Pulm/lungs Clear bilateral, normal effort  CVS/Heart Regular, no rub  Abdomen:  Soft, non tender, BS present  Extremities: 1+ peripheral and dependent edema, heel ulcers  Neurologic: Awake, alert, following commands this AM  Skin: No acute rashes, scaly          Basic Metabolic Panel:  Recent Labs Lab 01/20/15 0645  01/21/15 0616  01/22/15 0522  01/23/15 0827 01/23/15 2109 01/24/15 0835 01/24/15 2054 01/25/15 0839  NA 148*  < > 147*  < > 146*  < > 149* 142 146* 143  143 148*  K 4.5  --  4.4  --  4.5  --  3.9  --   --  5.1  --   CL 117*  --  114*  --  113*  --  113*  --   --  106  --   CO2 26  --  26  --  27  --  29  --   --  25  --   GLUCOSE 108*  --  103*  --  182*  --  94  --   --  456*  --   BUN 39*  --  38*  --  38*  --  36*  --   --  35*  --   CREATININE 3.26*  --  2.80*  --  2.62*  --  2.68*  --   --  2.88*  --   CALCIUM 9.4  --  9.4  --  9.4  --  9.1  --   --  8.7*  --   MG 2.4  --   --   --  2.1  --  1.9  --   --   --   --   PHOS 3.5  --   --   --  3.2  --  4.1  --   --  3.2  --   < > = values in this interval not displayed.   CBC:  Recent Labs Lab 01/19/15 0449 01/20/15 0903 01/21/15 0616 01/21/15 2001 01/22/15 0522  WBC 5.6 6.9 6.8   --  8.5  HGB 7.2* 6.7* 6.5* 13.3 9.0*  HCT 23.0* 21.0* 20.4* 45.9 27.9*  MCV 94.4 92.0 90.9  --  88.4  PLT 248 239 251  --  264      Microbiology: Results for orders placed or performed during the hospital encounter of 01/17/15  Blood Culture (routine x 2)     Status: None   Collection Time: 01/17/15 10:59 AM  Result Value Ref Range Status   Specimen Description BLOOD LEFT ARM  Final   Special Requests BOTTLES DRAWN AEROBIC AND ANAEROBIC .5 CC  Final  Culture NO GROWTH 5 DAYS  Final   Report Status 01/22/2015 FINAL  Final  Blood Culture (routine x 2)     Status: None   Collection Time: 01/17/15 11:11 AM  Result Value Ref Range Status   Specimen Description BLOOD LEFT HAND  Final   Special Requests BOTTLES DRAWN AEROBIC AND ANAEROBIC  Final   Culture NO GROWTH 5 DAYS  Final   Report Status 01/22/2015 FINAL  Final  Urine culture     Status: None   Collection Time: 01/17/15  1:20 PM  Result Value Ref Range Status   Specimen Description Urine  Final   Special Requests NONE  Final   Culture NO GROWTH 1 DAY  Final   Report Status 01/19/2015 FINAL  Final  Urine culture     Status: None   Collection Time: 01/17/15  5:58 PM  Result Value Ref Range Status   Specimen Description URINE, CLEAN CATCH  Final   Special Requests NONE  Final   Culture NO GROWTH 1 DAY  Final   Report Status 01/19/2015 FINAL  Final  MRSA PCR Screening     Status: None   Collection Time: 01/17/15  5:59 PM  Result Value Ref Range Status   MRSA by PCR NEGATIVE NEGATIVE Final    Comment:        The GeneXpert MRSA Assay (FDA approved for NASAL specimens only), is one component of a comprehensive MRSA colonization surveillance program. It is not intended to diagnose MRSA infection nor to guide or monitor treatment for MRSA infections.   C difficile quick scan w PCR reflex ([facility] only)     Status: None   Collection Time: 01/24/15 10:25 AM  Result Value Ref Range Status   C Diff antigen POSITIVE  Final    C Diff toxin NEGATIVE  Final   C Diff interpretation   Final    Negative for toxigenic C. difficile. Toxin gene and active toxin production not detected. May be a nontoxigenic strain of C. difficile bacteria present, lacking the ability to produce toxin.  Clostridium Difficile by PCR (not at [facility])     Status: None   Collection Time: 01/24/15 10:25 AM  Result Value Ref Range Status   C difficile by pcr NEGATIVE NEGATIVE Final    Coagulation Studies: No results for input(s): LABPROT, INR in the last 72 hours.  Urinalysis: No results for input(s): COLORURINE, LABSPEC, PHURINE, GLUCOSEU, HGBUR, BILIRUBINUR, KETONESUR, PROTEINUR, UROBILINOGEN, NITRITE, LEUKOCYTESUR in the last 72 hours.  Invalid input(s): APPERANCEUR    Imaging: No results found.   Medications:     . antiseptic oral rinse  7 mL Mouth Rinse q12n4p  . aspirin EC  81 mg Oral Daily  . budesonide (PULMICORT) nebulizer solution  0.5 mg Nebulization BID  . chlorhexidine  15 mL Mouth Rinse BID  . cholecalciferol  1,000 Units Oral Daily  . docusate sodium  100 mg Oral BID  . dutasteride  0.5 mg Oral Daily  . heparin  5,000 Units Subcutaneous 3 times per day  . insulin aspart  0-9 Units Subcutaneous TID AC & HS  . insulin detemir  20 Units Subcutaneous QHS  . magnesium oxide  400 mg Oral Daily  . metoprolol  50 mg Oral BID  . polyethylene glycol  17 g Oral q morning - 10a  . predniSONE  50 mg Oral Q breakfast  . saccharomyces boulardii  250 mg Oral TID  . sodium chloride  3 mL Intravenous Q12H  .  tamsulosin  0.4 mg Oral Daily   sodium chloride, acetaminophen **OR** acetaminophen, albuterol, benzonatate, hydrALAZINE, ondansetron **OR** ondansetron (ZOFRAN) IV, sodium chloride  Assessment/ Plan:  80 y.o. Black male with peripheral vascular disease, COPD, chronic kidney disease stage III Baseline creatinine 1.5, diabetes mellitus, hypertension, anemia of chronic kidney disease, dementia, BPH, history of colon cancer,  peripheral neuropathy, gastroparesis, multiple prior episodes of acute renal failure.    1. Acute renal failure/chronic kidney disease stage III Baseline creatinine 1.5/proteinuria. The patient has had prior episodes of acute renal failure. Negative spep/anca/gbm last admission, was hypothermic upon admission. Suspect that pt's acute renal failure is multifactorial now.  Was hypothermic suggesting a sepsis etiology, UA also indicative of UTI.   - Good UOP,  Cr down to 2.8, continue supportive care, no acute indication for HD.  - consider tapering steroids  2. Hypernatremia. Na now 143-148, encourage free water intake.  3. Anemia of chronic kidney disease: hgb 9.0    4. Sepsis with urinary tract infection:  Abx completed  LOS: 8 Cari Vandeberg 7/12/20169:48 AM

## 2015-01-25 NOTE — Progress Notes (Signed)
Physical Therapy Treatment Patient Details Name: Alan Mcintyre MRN: [id number] DOB: 1935/06/20 Today's Date: 01/25/2015    History of Present Illness Pt here with sepsis, anemia. Patient has also had elevated blood sugar levels during this stay.     PT Comments    Patient appears to have bilateral knee flexion contractures which are impacting his ambulation and OOB transfer ability currently. Patient also appears to have markedly atrophied thigh musculature indicating he has not ambulated in some time most likely. Patient today displays improved bed mobility, but intermittently follows commands of PT and becomes incontinent towards the end of the session. Patient is unable to maintain himself in standing secondary to knee flexion contractures. Patient still appears appropriate for short term rehabilitation to increase his independence with mobility.   Follow Up Recommendations  SNF     Equipment Recommendations       Recommendations for Other Services       Precautions / Restrictions Precautions Precautions: Fall Restrictions Weight Bearing Restrictions: No    Mobility  Bed Mobility Overal bed mobility: Needs Assistance Bed Mobility: Supine to Sit;Sit to Supine     Supine to sit: Min guard Sit to supine: Min assist   General bed mobility comments: Patient required verbal cuing for use of hand rails for performing supine to sit transfer.   Transfers Overall transfer level: Needs assistance Equipment used: Rolling walker (2 wheeled) Transfers: Sit to/from Stand Sit to Stand: Max assist         General transfer comment: Patient appears to have bilateral knee flexion contractures? He is unable to passively extend his LEs to full extension, which manifests in standing as he maintains knee flexion throughout.   Ambulation/Gait Ambulation/Gait assistance:  (Unable to ambulate due to difficulty standing. )               Stairs            Wheelchair  Mobility    Modified Rankin (Stroke Patients Only)       Balance Overall balance assessment: Needs assistance Sitting-balance support: Feet supported Sitting balance-Leahy Scale: Fair Sitting balance - Comments: Patient has 2 occasions where he leans backwards during this session, he was able to right himself with minimal assistance.  Postural control: Posterior lean Standing balance support: Bilateral upper extremity supported Standing balance-Leahy Scale: Zero Standing balance comment: Patient was unable to stand with maximum assistance and RW.                     Cognition Arousal/Alertness: Awake/alert Behavior During Therapy: Flat affect (Patient intermittently answered questions and did not provide much in the way of conversation. ) Overall Cognitive Status: Difficult to assess                      Exercises General Exercises - Lower Extremity Ankle Circles/Pumps: AROM;20 reps;Both Long Arc Quad: AROM;AAROM;Both;10 reps (2 sets ) Heel Slides: AROM;Both;10 reps Hip ABduction/ADduction: AROM;AAROM;Both;5 reps Straight Leg Raises: AROM;AAROM;Both;10 reps Hip Flexion/Marching: AROM;AAROM;Both;10 reps Other Exercises Other Exercises: Sit <--> stand attempts with RW and max A x 2 Other Exercises: Seated balance drills attempting to push patient posteriorly, anteriorly, or laterally with patient holding himself upright x 10    General Comments General comments (skin integrity, edema, etc.): Some dry skin and scabbing around left knee.       Pertinent Vitals/Pain Pain Assessment: No/denies pain    Home Living  Prior Function            PT Goals (current goals can now be found in the care plan section) Acute Rehab PT Goals Patient Stated Goal: Pt does not state Time For Goal Achievement: 02/05/15 Progress towards PT goals: Progressing toward goals    Frequency  Min 2X/week    PT Plan Current plan remains appropriate     Co-evaluation             End of Session Equipment Utilized During Treatment: Gait belt Activity Tolerance: Patient limited by fatigue (Patient intermittently wheezing throughout session.) Patient left: in bed;with nursing/sitter in room;with call bell/phone within reach     Time: 1110-1133 PT Time Calculation (min) (ACUTE ONLY): 23 min  Charges:  $Therapeutic Exercise: 23-37 mins                    G Codes:      Kerman Passey, PT, DPT    01/25/2015, 1:51 PM

## 2015-01-25 NOTE — Progress Notes (Signed)
Patient alert and oriented x3, no complaints at this time. vss at this time. Patient NSR on telemetry. Will continue to assess. BP elevated during lunch, PRN BP med given, BP now stable. Wilnette Kales

## 2015-01-25 NOTE — Progress Notes (Signed)
Capillary glucose 158 at 0305. MD notified. New order for long acting insulin added by Dr. Jannifer Franklin.

## 2015-01-25 NOTE — Progress Notes (Signed)
Patient BS 454, per sliding scale STAT glucose verification ordered and Dr Margaretmary Eddy notified. Stated to give 15 units of sliding scale with 5 units meal coverage. No other orders at this time. Wilnette Kales

## 2015-01-25 NOTE — Progress Notes (Signed)
Inpatient Diabetes Program Recommendations  AACE/ADA: New Consensus Statement on Inpatient Glycemic Control (2013)  Target Ranges:  Prepandial:   less than 140 mg/dL      Peak postprandial:   less than 180 mg/dL (1-2 hours)      Critically ill patients:  140 - 180 mg/dL   Diabetes history: type 2 Outpatient Diabetes medications: Levemir 20 units QHS, Novolog sliding scale Current orders for Inpatient glycemic control: Novolog 0-9 units ACHS   Correction (SSI): Please consider increasing Novolog correction to moderate scale, 0-15 units  Insulin - Meal Coverage: While inpatient and ordered steroids, please consider ordering Novolog 5 units TID with meals for meal coverage (in addition to Novolog correction).  Gentry Fitz, RN, BA, MHA, CDE Diabetes Coordinator Inpatient Diabetes Program  [phone] (Team Pager) [phone] ([place]) 01/25/2015 11:58 AM

## 2015-01-26 LAB — CBC
HCT: 28.2 % — ABNORMAL LOW (ref 40.0–52.0)
Hemoglobin: 9.1 g/dL — ABNORMAL LOW (ref 13.0–18.0)
MCH: 28.5 pg (ref 26.0–34.0)
MCHC: 32.3 g/dL (ref 32.0–36.0)
MCV: 88.2 fL (ref 80.0–100.0)
PLATELETS: 264 10*3/uL (ref 150–440)
RBC: 3.2 MIL/uL — ABNORMAL LOW (ref 4.40–5.90)
RDW: 18.5 % — ABNORMAL HIGH (ref 11.5–14.5)
WBC: 10.5 10*3/uL (ref 3.8–10.6)

## 2015-01-26 LAB — GLUCOSE, CAPILLARY
GLUCOSE-CAPILLARY: 109 mg/dL — AB (ref 65–99)
Glucose-Capillary: 155 mg/dL — ABNORMAL HIGH (ref 65–99)
Glucose-Capillary: 221 mg/dL — ABNORMAL HIGH (ref 65–99)
Glucose-Capillary: 334 mg/dL — ABNORMAL HIGH (ref 65–99)
Glucose-Capillary: 345 mg/dL — ABNORMAL HIGH (ref 65–99)

## 2015-01-26 LAB — RENAL FUNCTION PANEL
ALBUMIN: 2.9 g/dL — AB (ref 3.5–5.0)
ANION GAP: 10 (ref 5–15)
BUN: 37 mg/dL — ABNORMAL HIGH (ref 6–20)
CALCIUM: 8.9 mg/dL (ref 8.9–10.3)
CO2: 28 mmol/L (ref 22–32)
CREATININE: 2.54 mg/dL — AB (ref 0.61–1.24)
Chloride: 108 mmol/L (ref 101–111)
GFR calc Af Amer: 26 mL/min — ABNORMAL LOW (ref >60)
GFR calc non Af Amer: 23 mL/min — ABNORMAL LOW (ref >60)
Glucose, Bld: 247 mg/dL — ABNORMAL HIGH (ref 65–99)
PHOSPHORUS: 3 mg/dL (ref 2.5–4.6)
Potassium: 4 mmol/L (ref 3.5–5.1)
SODIUM: 146 mmol/L — AB (ref 135–145)

## 2015-01-26 LAB — SODIUM
SODIUM: 146 mmol/L — AB (ref 135–145)
SODIUM: 144 mmol/L (ref 135–145)

## 2015-01-26 MED ORDER — METOPROLOL TARTRATE 25 MG PO TABS
12.5000 mg | ORAL_TABLET | Freq: Two times a day (BID) | ORAL | Status: DC
  Administered 2015-01-26 – 2015-01-27 (×2): 12.5 mg via ORAL
  Filled 2015-01-26 (×2): qty 1

## 2015-01-26 NOTE — Progress Notes (Signed)
Pt had no IV access on assessment for this shift. This nurse with another nurse from [facility] attempted to start a new IV access but were unsuccessful.

## 2015-01-26 NOTE — Clinical Social Work Note (Addendum)
CSW spoke to pt and pt's daughter to confirm that pt would DC to [facility] tomorrow.  Pt's daughter Denyse Dago is still in agreement with this DC plan.  Facility is aware.  CSW will also attempt to get pt's signature on medical records release for pt's records to be released.  Pt's daughter is in the process of trying to transfer pt to [place].

## 2015-01-26 NOTE — Care Management Important Message (Signed)
Important Message  Patient Details  Name: Alan Mcintyre MRN: [id number] Date of Birth: 1934/10/30   Medicare Important Message Given:  Geralyn Flash notification given    Darius Bump Allmond 01/26/2015, 9:35 AM

## 2015-01-26 NOTE — Progress Notes (Addendum)
[place] [facility] [place] NAME: Alan Mcintyre    MR#:  [id number]  DATE OF BIRTH:  25-Jul-1934  SUBJECTIVE: Admitted for sepsis secondary to UTI, pneumonia. Patient is treatment for pneumonia, UTI. However has significant wheezing so unable to discharge the patient. Patient has dementia unable to give any complaints. Not in distress at this time.   CHIEF COMPLAINT:   Chief Complaint  Patient presents with  . Shortness of Breath    from nursing home with reported dyspnea   Admitted for AMS, hypothermia and Hypoxia.  REVIEW OF SYSTEMS:    ROS  Unobtainable due to dementia  DRUG ALLERGIES:  No Known Allergies  VITALS:  Blood pressure 153/61, pulse 58, temperature 98.6 F (37 C), temperature source Oral, resp. rate 20, height 5\' 7"  (1.702 m), weight 83.779 kg (184 lb 11.2 oz), SpO2 96 %.  PHYSICAL EXAMINATION:   Physical Exam  GENERAL:  80 y.o.-year-old patient lying in the bed with no acute distress.  EYES: Pupils equal, round, reactive to light and accommodation. No scleral icterus. Extraocular muscles intact.  HEENT: Head atraumatic, normocephalic. Oropharynx and nasopharynx clear.  NECK:  Supple, no jugular venous distention. No thyroid enlargement, no tenderness.  LUNGS: Has expiratory wheezing in all lung fields. Not using accessory muscles of respiration. CARDIOVASCULAR: S1, S2 normal. No murmurs, rubs, or gallops.  ABDOMEN: Soft, nontender, nondistended. Bowel sounds present. No organomegaly or mass.  EXTREMITIES: No cyanosis, clubbing. 2 + edema LE. NEUROLOGIC: Cranial nerves II through XII are intact. No focal Motor or sensory deficits b/l.   PSYCHIATRIC: The patient is  alert SKIN: No obvious rash, lesion, or ulcer.    LABORATORY PANEL:   CBC  Recent Labs Lab 01/26/15 0040  WBC 10.5  HGB 9.1*  HCT 28.2*  PLT 264    ------------------------------------------------------------------------------------------------------------------  Chemistries   Recent Labs Lab 01/23/15 0827  01/26/15 0040 01/26/15 0858  NA 149*  < > 146* 146*  K 3.9  < > 4.0  --   CL 113*  < > 108  --   CO2 29  < > 28  --   GLUCOSE 94  < > 247*  --   BUN 36*  < > 37*  --   CREATININE 2.68*  < > 2.54*  --   CALCIUM 9.1  < > 8.9  --   MG 1.9  --   --   --   < > = values in this interval not displayed. ------------------------------------------------------------------------------------------------------------------  Cardiac Enzymes No results for input(s): TROPONINI in the last 168 hours. ------------------------------------------------------------------------------------------------------------------  RADIOLOGY:  Dg Chest Port 1 View  01/25/2015   CLINICAL DATA:  Chest pain, productive cough  EXAM: PORTABLE CHEST - 1 VIEW  COMPARISON:  01/17/2015  FINDINGS: Cardiac shadow is stable. The previously seen right basilar changes have resolved in the interval. No focal infiltrate or sizable effusion is seen. No bony abnormality is noted.  IMPRESSION: No active disease.   Electronically Signed   By: Inez Catalina M.D.   On: 01/25/2015 14:03     ASSESSMENT AND PLAN:   * Aspiration pneumonia: Finished and the biotics. Continue aspiration precautions.  Consult speech, recommends pured diet with nectar thick liquids    * Acute respiratory failure 2/2 COPD exacerbation O2 for sats > 90% Nebs PRN By mouth prednisone. Repeat chest x-ray yesterday did not show a pneumonia.  * UTI Completed IV abx. Urine culture negative   * ARF over  CKD3 renal function still did not baseline yet. Nephrology is following. D5-NS due to hypernatremia, . Follow-up with nephrology. I/Os. D/Ced  Foley cath BMP am  * Severe sepsis due to aspiration pneumonia, UTI. Improving Blood cultures are negative   * Acute encephalopathy Clinically  better, now baseline has dementia.  *Dysarthria  CT negative head, no past medical history of strokes, but recent CT head with remote infarcts   neurology consult -recommends no further workup   *  Anemia of chronic disease with worsening S/p transfusion;stable  * Hypothermia due to sepsis Improved. Off Bair hugger  * COPD Nebs. steroids  * DM SSI  Diarrhea stool for C. difficile is negative stool cultures pending.    Consult physical therapy - recommends SNF  All the records are reviewed and case discussed with Care Management/Social Workerr. Management plans discussed with the patient, family and they are in agreement.    CODE STATUS: FULL CODE  DVT Prophylaxis: SCDs  TOTAL TIME TAKING CARE OF THIS PATIENT: 35 minutes.   Anticipated discharge in a.m to skilled nursing facility. if clinically stable   Baudelia Schroepfer M.D on 01/26/2015 at 10:34 AM  Between 7am to 6pm - Pager - [phone]  After 6pm go to [email or web address] - password [email or web address] [facility]  Office  [phone]  CC: Primary care physician; Lorelee Market, MD

## 2015-01-26 NOTE — Progress Notes (Signed)
Nutrition Follow-up    INTERVENTION:   Meals and Snacks: Cater to patient preferences, encourage fluid intake Medical Food Supplement Therapy: continue Art therapist, Magic Cup   NUTRITION DIAGNOSIS:   Inadequate oral intake related to acute illness as evidenced by NPO status, meal completion < 50%. Improving as pt eating 85% of meals on average, taking supplements   GOAL:   Patient will meet greater than or equal to 90% of their needs   MONITOR:    (Energy Intake, Anthropometrics, Digestive System, Electrolyte/Renal Profile)  ASSESSMENT:    Diet Order:  DIET - DYS 1 Room service appropriate?: Yes; Fluid consistency:: Nectar Thick   Energy Intake:  Pt eating 85% of meals on average, drinking mighty shakes  Skin:  Reviewed, no issues  Last BM:  7/13   Protein Profile:  Recent Labs Lab 01/24/15 2054 01/26/15 0040  ALBUMIN 2.9* 2.9*   Electrolyte and Renal Profile:  Recent Labs Lab 01/20/15 0645  01/22/15 0522  01/23/15 0827  01/24/15 2054  01/25/15 1021 01/26/15 0040 01/26/15 0858  BUN 39*  < > 38*  --  36*  --  35*  --  30* 37*  --   CREATININE 3.26*  < > 2.62*  --  2.68*  --  2.88*  --  2.55* 2.54*  --   NA 148*  < > 146*  < > 149*  < > 143  143  < > 145 146* 146*  K 4.5  < > 4.5  --  3.9  --  5.1  --  3.7 4.0  --   MG 2.4  --  2.1  --  1.9  --   --   --   --   --   --   PHOS 3.5  --  3.2  --  4.1  --  3.2  --   --  3.0  --   < > = values in this interval not displayed.  Meds: reviewed  Height:   Ht Readings from Last 1 Encounters:  01/22/15 5\' 7"  (1.702 m)    Weight:   Wt Readings from Last 1 Encounters:  01/26/15 184 lb 11.2 oz (83.779 kg)   Filed Weights   01/22/15 0300 01/22/15 1005 01/26/15 0320  Weight: 199 lb 15.3 oz (90.7 kg) 197 lb 9.6 oz (89.631 kg) 184 lb 11.2 oz (83.779 kg)     Wt Readings from Last 10 Encounters:  01/26/15 184 lb 11.2 oz (83.779 kg)  01/08/15 184 lb 14.4 oz (83.87 kg)    BMI:  Body mass index is 28.92  kg/(m^2).  Estimated Nutritional Needs:   Kcal:  2198-2597 kcals (BEE 1665, 1.2 AF, 1.1-1.3 IF) using current wt of 90.7 kg  Protein:  91-109 g (1.0-1.2 g/kg)   Fluid:  2275-2730 mL (25-30 ml/kg)   EDUCATION NEEDS:   No education needs identified at this time  [place], RD, LDN [phone] Pager

## 2015-01-26 NOTE — Care Management (Signed)
Patient required decreased in metoprolol dose today for bradycardia.  Anticipate patiet will discahrge to skilled nursing facility within 24 hours

## 2015-01-26 NOTE — Progress Notes (Signed)
Subjective:  clinically about the same Able to answer simple Qs Denies any acute SOB Serum creatinine is about the same at 2.54 today   Objective:  Vital signs in last 24 hours:  Temp:  [97.5 F (36.4 C)-98.8 F (37.1 C)] 98.1 F (36.7 C) (07/13 1046) Pulse Rate:  [54-88] 54 (07/13 1046) Resp:  [20-23] 21 (07/13 1046) BP: (152-196)/(60-92) 166/60 mmHg (07/13 1046) SpO2:  [96 %-100 %] 99 % (07/13 1046) Weight:  [83.779 kg (184 lb 11.2 oz)] 83.779 kg (184 lb 11.2 oz) (07/13 0320)  Weight change:  Filed Weights   01/22/15 0300 01/22/15 1005 01/26/15 0320  Weight: 90.7 kg (199 lb 15.3 oz) 89.631 kg (197 lb 9.6 oz) 83.779 kg (184 lb 11.2 oz)    Intake/Output: I/O last 3 completed shifts: In: 480 [P.O.:480] Out: 0      Physical Exam: General: NAD  HEENT Moist mucus moist, eyes open  Neck supple  Pulm/lungs Clear bilateral, normal effort  CVS/Heart Regular, no rub  Abdomen:  Soft, non tender, BS present  Extremities:  trace peripheral and dependent edema, heel ulcers  Neurologic: Awake, alert, following commands this AM  Skin: No acute rashes, scaly          Basic Metabolic Panel:  Recent Labs Lab 01/20/15 0645  01/22/15 0522  01/23/15 0827  01/24/15 2054 01/25/15 0839 01/25/15 1021 01/25/15 1722 01/26/15 0040 01/26/15 0858  NA 148*  < > 146*  < > 149*  < > 143  143 148* 145  --  146* 146*  K 4.5  < > 4.5  --  3.9  --  5.1  --  3.7  --  4.0  --   CL 117*  < > 113*  --  113*  --  106  --  109  --  108  --   CO2 26  < > 27  --  29  --  25  --  30  --  28  --   GLUCOSE 108*  < > 182*  --  94  --  456*  --  197* 444* 247*  --   BUN 39*  < > 38*  --  36*  --  35*  --  30*  --  37*  --   CREATININE 3.26*  < > 2.62*  --  2.68*  --  2.88*  --  2.55*  --  2.54*  --   CALCIUM 9.4  < > 9.4  --  9.1  --  8.7*  --  8.9  --  8.9  --   MG 2.4  --  2.1  --  1.9  --   --   --   --   --   --   --   PHOS 3.5  --  3.2  --  4.1  --  3.2  --   --   --  3.0  --   < > =  values in this interval not displayed.   CBC:  Recent Labs Lab 01/20/15 0903 01/21/15 0616 01/21/15 2001 01/22/15 0522 01/25/15 1021 01/26/15 0040  WBC 6.9 6.8  --  8.5 10.2 10.5  HGB 6.7* 6.5* 13.3 9.0* 9.7* 9.1*  HCT 21.0* 20.4* 45.9 27.9* 30.1* 28.2*  MCV 92.0 90.9  --  88.4 88.6 88.2  PLT 239 251  --  264 251 264      Microbiology: Results for orders placed or performed during the hospital encounter of 01/17/15  Blood Culture (routine x 2)     Status: None   Collection Time: 01/17/15 10:59 AM  Result Value Ref Range Status   Specimen Description BLOOD LEFT ARM  Final   Special Requests BOTTLES DRAWN AEROBIC AND ANAEROBIC .5 CC  Final   Culture NO GROWTH 5 DAYS  Final   Report Status 01/22/2015 FINAL  Final  Blood Culture (routine x 2)     Status: None   Collection Time: 01/17/15 11:11 AM  Result Value Ref Range Status   Specimen Description BLOOD LEFT HAND  Final   Special Requests BOTTLES DRAWN AEROBIC AND ANAEROBIC  Final   Culture NO GROWTH 5 DAYS  Final   Report Status 01/22/2015 FINAL  Final  Urine culture     Status: None   Collection Time: 01/17/15  1:20 PM  Result Value Ref Range Status   Specimen Description Urine  Final   Special Requests NONE  Final   Culture NO GROWTH 1 DAY  Final   Report Status 01/19/2015 FINAL  Final  Urine culture     Status: None   Collection Time: 01/17/15  5:58 PM  Result Value Ref Range Status   Specimen Description URINE, CLEAN CATCH  Final   Special Requests NONE  Final   Culture NO GROWTH 1 DAY  Final   Report Status 01/19/2015 FINAL  Final  MRSA PCR Screening     Status: None   Collection Time: 01/17/15  5:59 PM  Result Value Ref Range Status   MRSA by PCR NEGATIVE NEGATIVE Final    Comment:        The GeneXpert MRSA Assay (FDA approved for NASAL specimens only), is one component of a comprehensive MRSA colonization surveillance program. It is not intended to diagnose MRSA infection nor to guide or monitor  treatment for MRSA infections.   C difficile quick scan w PCR reflex ([facility] only)     Status: None   Collection Time: 01/24/15 10:25 AM  Result Value Ref Range Status   C Diff antigen POSITIVE  Final   C Diff toxin NEGATIVE  Final   C Diff interpretation   Final    Negative for toxigenic C. difficile. Toxin gene and active toxin production not detected. May be a nontoxigenic strain of C. difficile bacteria present, lacking the ability to produce toxin.  Clostridium Difficile by PCR (not at [facility])     Status: None   Collection Time: 01/24/15 10:25 AM  Result Value Ref Range Status   C difficile by pcr NEGATIVE NEGATIVE Final  Stool culture     Status: None (Preliminary result)   Collection Time: 01/25/15  1:59 AM  Result Value Ref Range Status   Specimen Description STOOL  Final   Special Requests NONE  Final   Culture NO SALMONELLA OR SHIGELLA ISOLATED  Final   Report Status PENDING  Incomplete    Coagulation Studies: No results for input(s): LABPROT, INR in the last 72 hours.  Urinalysis: No results for input(s): COLORURINE, LABSPEC, PHURINE, GLUCOSEU, HGBUR, BILIRUBINUR, KETONESUR, PROTEINUR, UROBILINOGEN, NITRITE, LEUKOCYTESUR in the last 72 hours.  Invalid input(s): APPERANCEUR    Imaging: Dg Chest Port 1 View  01/25/2015   CLINICAL DATA:  Chest pain, productive cough  EXAM: PORTABLE CHEST - 1 VIEW  COMPARISON:  01/17/2015  FINDINGS: Cardiac shadow is stable. The previously seen right basilar changes have resolved in the interval. No focal infiltrate or sizable effusion is seen. No bony abnormality is noted.  IMPRESSION: No  active disease.   Electronically Signed   By: Inez Catalina M.D.   On: 01/25/2015 14:03     Medications:     . antiseptic oral rinse  7 mL Mouth Rinse q12n4p  . aspirin EC  81 mg Oral Daily  . budesonide (PULMICORT) nebulizer solution  0.5 mg Nebulization BID  . chlorhexidine  15 mL Mouth Rinse BID  . cholecalciferol  1,000 Units Oral Daily  .  docusate sodium  100 mg Oral BID  . dutasteride  0.5 mg Oral Daily  . heparin  5,000 Units Subcutaneous 3 times per day  . insulin aspart  0-15 Units Subcutaneous TID WC  . insulin aspart  5 Units Subcutaneous TID WC  . insulin detemir  20 Units Subcutaneous QHS  . ipratropium-albuterol  3 mL Nebulization Q6H  . magnesium oxide  400 mg Oral Daily  . metoprolol  12.5 mg Oral BID  . polyethylene glycol  17 g Oral q morning - 10a  . predniSONE  50 mg Oral Q breakfast  . saccharomyces boulardii  250 mg Oral TID  . sodium chloride  3 mL Intravenous Q12H  . tamsulosin  0.4 mg Oral Daily   sodium chloride, acetaminophen **OR** acetaminophen, albuterol, benzonatate, hydrALAZINE, ondansetron **OR** ondansetron (ZOFRAN) IV, sodium chloride  Assessment/ Plan:  80 y.o. Black male with peripheral vascular disease, COPD, chronic kidney disease stage III Baseline creatinine 1.5, diabetes mellitus, hypertension, anemia of chronic kidney disease, dementia, BPH, history of colon cancer, peripheral neuropathy, gastroparesis, multiple prior episodes of acute renal failure.    1. Acute renal failure/chronic kidney disease stage III Baseline creatinine 1.5/proteinuria. The patient has had prior episodes of acute renal failure. Negative spep/anca/gbm last admission, was hypothermic upon admission. Suspect that pt's acute renal failure is multifactorial now.  Was hypothermic suggesting a sepsis etiology, UA also indicative of UTI.   - Good UOP,  Cr down to 2.5, continue supportive care, no acute indication for HD.  - consider tapering steroids  2. Hypernatremia. Na now 143-148, encourage free water intake.  3. Anemia of chronic kidney disease: hgb 9.1  4. Sepsis with urinary tract infection:  Abx completed  LOS: 9 Suleman Gunning 7/13/201611:09 AM

## 2015-01-26 NOTE — Progress Notes (Signed)
Assessment completed.  No distress on ra.  Lungs diminished bil.  Moves all extremities well.  No IV access at this time.  Alert, oriented to person, place, time.  Fall precautions in place.  Denies need or pain at this time  CB in reach, SR up x 2, bed alarm on.

## 2015-01-27 ENCOUNTER — Ambulatory Visit: Payer: Medicare Other | Admitting: Podiatry

## 2015-01-27 LAB — BASIC METABOLIC PANEL
Anion gap: 8 (ref 5–15)
BUN: 38 mg/dL — ABNORMAL HIGH (ref 6–20)
CALCIUM: 8.8 mg/dL — AB (ref 8.9–10.3)
CHLORIDE: 109 mmol/L (ref 101–111)
CO2: 31 mmol/L (ref 22–32)
CREATININE: 2.39 mg/dL — AB (ref 0.61–1.24)
GFR, EST AFRICAN AMERICAN: 28 mL/min — AB (ref >60)
GFR, EST NON AFRICAN AMERICAN: 25 mL/min — AB (ref >60)
Glucose, Bld: 149 mg/dL — ABNORMAL HIGH (ref 65–99)
Potassium: 3.3 mmol/L — ABNORMAL LOW (ref 3.5–5.1)
SODIUM: 148 mmol/L — AB (ref 135–145)

## 2015-01-27 LAB — STOOL CULTURE

## 2015-01-27 LAB — SODIUM: SODIUM: 149 mmol/L — AB (ref 135–145)

## 2015-01-27 LAB — GLUCOSE, CAPILLARY
Glucose-Capillary: 87 mg/dL (ref 65–99)
Glucose-Capillary: 189 mg/dL — ABNORMAL HIGH (ref 65–99)

## 2015-01-27 MED ORDER — INSULIN ASPART 100 UNIT/ML ~~LOC~~ SOLN: 5.0000 [IU] | Freq: Three times a day (TID) | SUBCUTANEOUS | Status: DC

## 2015-01-27 MED ORDER — ALBUTEROL SULFATE (2.5 MG/3ML) 0.083% IN NEBU: 2.5000 mg | INHALATION_SOLUTION | RESPIRATORY_TRACT | Status: DC | PRN

## 2015-01-27 MED ORDER — PREDNISONE 10 MG (21) PO TBPK: 10.0000 mg | ORAL_TABLET | Freq: Every day | ORAL | Status: DC

## 2015-01-27 MED ORDER — METOPROLOL TARTRATE 25 MG PO TABS: 12.5000 mg | ORAL_TABLET | Freq: Two times a day (BID) | ORAL | Status: DC

## 2015-01-27 NOTE — Progress Notes (Signed)
[facility] unit here to transport pt to [facility].  Facility packet given to transport team

## 2015-01-27 NOTE — Clinical Social Work Note (Signed)
CSW notified pt pt's daughter, facility and RN that pt would DC today to [facility] via EMS.  CSW signing off

## 2015-01-27 NOTE — Progress Notes (Signed)
Report called to [facility] at [facility].

## 2015-01-27 NOTE — Discharge Instructions (Signed)
Dysphagia I with nectar thick liquids, carb modified diabetic diet

## 2015-01-27 NOTE — Clinical Social Work Note (Signed)
CSW spoke to pt.  He was alert and Ox4.  He confirmed that he did want his daughter Alan Mcintyre to have access to his medical records in order to transfer him up to [place] once he has gone to his SNF [facility]).  CSW faxed form to Medical Records office [place] [phone].  CSW will continue to follow to DC.

## 2015-01-27 NOTE — Progress Notes (Signed)
911 called for non emergent transport to [facility]

## 2015-01-27 NOTE — Progress Notes (Signed)
Subjective:  clinically about the same Able to answer simple Qs Denies any acute SOB Serum creatinine slightly lower today at 2.38  Objective:  Vital signs in last 24 hours:  Temp:  [98.2 F (36.8 C)-98.8 F (37.1 C)] 98.3 F (36.8 C) (07/14 1056) Pulse Rate:  [74-92] 77 (07/14 1056) Resp:  [20-21] 21 (07/14 1056) BP: (152-184)/(65-79) 156/69 mmHg (07/14 1056) SpO2:  [97 %-100 %] 97 % (07/14 1056)  Weight change:  Filed Weights   01/22/15 0300 01/22/15 1005 01/26/15 0320  Weight: 90.7 kg (199 lb 15.3 oz) 89.631 kg (197 lb 9.6 oz) 83.779 kg (184 lb 11.2 oz)    Intake/Output: I/O last 3 completed shifts: In: 1003 [P.O.:1000; I.V.:3] Out: 5 [Urine:5]     Physical Exam: General: NAD  HEENT Moist mucus moist, eyes open  Neck supple  Pulm/lungs  mild bilateral wheezing, normal effort  CVS/Heart Regular, no rub  Abdomen:  Soft, non tender, BS present  Extremities:  trace peripheral and dependent edema, heel ulcers  Neurologic: Awake, alert, following commands this AM  Skin: No acute rashes, scaly          Basic Metabolic Panel:  Recent Labs Lab 01/22/15 0522  01/23/15 0827  01/24/15 2054  01/25/15 1021 01/25/15 1722 01/26/15 0040 01/26/15 0858 01/26/15 2022 01/27/15 0335 01/27/15 0818  NA 146*  < > 149*  < > 143  143  < > 145  --  146* 146* 144 148* 149*  K 4.5  --  3.9  --  5.1  --  3.7  --  4.0  --   --  3.3*  --   CL 113*  --  113*  --  106  --  109  --  108  --   --  109  --   CO2 27  --  29  --  25  --  30  --  28  --   --  31  --   GLUCOSE 182*  --  94  --  456*  --  197* 444* 247*  --   --  149*  --   BUN 38*  --  36*  --  35*  --  30*  --  37*  --   --  38*  --   CREATININE 2.62*  --  2.68*  --  2.88*  --  2.55*  --  2.54*  --   --  2.39*  --   CALCIUM 9.4  --  9.1  --  8.7*  --  8.9  --  8.9  --   --  8.8*  --   MG 2.1  --  1.9  --   --   --   --   --   --   --   --   --   --   PHOS 3.2  --  4.1  --  3.2  --   --   --  3.0  --   --   --   --    < > = values in this interval not displayed.   CBC:  Recent Labs Lab 01/21/15 0616 01/21/15 2001 01/22/15 0522 01/25/15 1021 01/26/15 0040  WBC 6.8  --  8.5 10.2 10.5  HGB 6.5* 13.3 9.0* 9.7* 9.1*  HCT 20.4* 45.9 27.9* 30.1* 28.2*  MCV 90.9  --  88.4 88.6 88.2  PLT 251  --  264 251 264      Microbiology: Results for  orders placed or performed during the hospital encounter of 01/17/15  Blood Culture (routine x 2)     Status: None   Collection Time: 01/17/15 10:59 AM  Result Value Ref Range Status   Specimen Description BLOOD LEFT ARM  Final   Special Requests BOTTLES DRAWN AEROBIC AND ANAEROBIC .5 CC  Final   Culture NO GROWTH 5 DAYS  Final   Report Status 01/22/2015 FINAL  Final  Blood Culture (routine x 2)     Status: None   Collection Time: 01/17/15 11:11 AM  Result Value Ref Range Status   Specimen Description BLOOD LEFT HAND  Final   Special Requests BOTTLES DRAWN AEROBIC AND ANAEROBIC  Final   Culture NO GROWTH 5 DAYS  Final   Report Status 01/22/2015 FINAL  Final  Urine culture     Status: None   Collection Time: 01/17/15  1:20 PM  Result Value Ref Range Status   Specimen Description Urine  Final   Special Requests NONE  Final   Culture NO GROWTH 1 DAY  Final   Report Status 01/19/2015 FINAL  Final  Urine culture     Status: None   Collection Time: 01/17/15  5:58 PM  Result Value Ref Range Status   Specimen Description URINE, CLEAN CATCH  Final   Special Requests NONE  Final   Culture NO GROWTH 1 DAY  Final   Report Status 01/19/2015 FINAL  Final  MRSA PCR Screening     Status: None   Collection Time: 01/17/15  5:59 PM  Result Value Ref Range Status   MRSA by PCR NEGATIVE NEGATIVE Final    Comment:        The GeneXpert MRSA Assay (FDA approved for NASAL specimens only), is one component of a comprehensive MRSA colonization surveillance program. It is not intended to diagnose MRSA infection nor to guide or monitor treatment for MRSA infections.    C difficile quick scan w PCR reflex ([facility] only)     Status: None   Collection Time: 01/24/15 10:25 AM  Result Value Ref Range Status   C Diff antigen POSITIVE  Final   C Diff toxin NEGATIVE  Final   C Diff interpretation   Final    Negative for toxigenic C. difficile. Toxin gene and active toxin production not detected. May be a nontoxigenic strain of C. difficile bacteria present, lacking the ability to produce toxin.  Clostridium Difficile by PCR (not at [facility])     Status: None   Collection Time: 01/24/15 10:25 AM  Result Value Ref Range Status   C difficile by pcr NEGATIVE NEGATIVE Final  Stool culture     Status: None (Preliminary result)   Collection Time: 01/25/15  1:59 AM  Result Value Ref Range Status   Specimen Description STOOL  Final   Special Requests NONE  Final   Culture NO SALMONELLA OR SHIGELLA ISOLATED  Final   Report Status PENDING  Incomplete    Coagulation Studies: No results for input(s): LABPROT, INR in the last 72 hours.  Urinalysis: No results for input(s): COLORURINE, LABSPEC, PHURINE, GLUCOSEU, HGBUR, BILIRUBINUR, KETONESUR, PROTEINUR, UROBILINOGEN, NITRITE, LEUKOCYTESUR in the last 72 hours.  Invalid input(s): APPERANCEUR    Imaging: Dg Chest Port 1 View  01/25/2015   CLINICAL DATA:  Chest pain, productive cough  EXAM: PORTABLE CHEST - 1 VIEW  COMPARISON:  01/17/2015  FINDINGS: Cardiac shadow is stable. The previously seen right basilar changes have resolved in the interval. No focal infiltrate or sizable  effusion is seen. No bony abnormality is noted.  IMPRESSION: No active disease.   Electronically Signed   By: Inez Catalina M.D.   On: 01/25/2015 14:03     Medications:     . antiseptic oral rinse  7 mL Mouth Rinse q12n4p  . aspirin EC  81 mg Oral Daily  . chlorhexidine  15 mL Mouth Rinse BID  . cholecalciferol  1,000 Units Oral Daily  . docusate sodium  100 mg Oral BID  . dutasteride  0.5 mg Oral Daily  . heparin  5,000 Units Subcutaneous 3  times per day  . insulin aspart  0-15 Units Subcutaneous TID WC  . insulin aspart  5 Units Subcutaneous TID WC  . insulin detemir  20 Units Subcutaneous QHS  . magnesium oxide  400 mg Oral Daily  . metoprolol  12.5 mg Oral BID  . polyethylene glycol  17 g Oral q morning - 10a  . predniSONE  50 mg Oral Q breakfast  . saccharomyces boulardii  250 mg Oral TID  . sodium chloride  3 mL Intravenous Q12H  . tamsulosin  0.4 mg Oral Daily   sodium chloride, acetaminophen **OR** acetaminophen, albuterol, benzonatate, hydrALAZINE, ondansetron **OR** ondansetron (ZOFRAN) IV, sodium chloride  Assessment/ Plan:  80 y.o. Black male with peripheral vascular disease, COPD, chronic kidney disease stage III Baseline creatinine 1.5, diabetes mellitus, hypertension, anemia of chronic kidney disease, dementia, BPH, history of colon cancer, peripheral neuropathy, gastroparesis, multiple prior episodes of acute renal failure.    1. Acute renal failure/chronic kidney disease stage III Baseline creatinine 1.5/proteinuria. The patient has had prior episodes of acute renal failure. Negative spep/anca/gbm last admission, was hypothermic upon admission. Suspect that pt's acute renal failure is multifactorial now.  Was hypothermic suggesting a sepsis etiology, UA also indicative of UTI.   - Good UOP,  Cr down to 2.39, continue supportive care, no acute indication for HD.  - consider tapering steroids  2. Hypernatremia. Na now 143-148, encourage free water intake.  3. Anemia of chronic kidney disease: hgb 9.1  4. Sepsis with urinary tract infection:  Abx completed  5. Hypokalemia - prn replacement   LOS: 10 Alan Mcintyre 7/14/201611:27 AM

## 2015-01-27 NOTE — Progress Notes (Signed)
Dr. Vianne Bulls made aware of pt's elevated BP overnight and use of prn Hydralazine, no new orders received.
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
## 2015-01-27 NOTE — Progress Notes (Signed)
Inpatient Diabetes Program Recommendations  AACE/ADA: New Consensus Statement on Inpatient Glycemic Control (2013)  Target Ranges:  Prepandial:   less than 140 mg/dL      Peak postprandial:   less than 180 mg/dL (1-2 hours)      Critically ill patients:  140 - 180 mg/dL   Results for Alan Mcintyre, Alan Mcintyre (MRN [id number]) as of 01/27/2015 09:01  Ref. Range 01/26/2015 07:12 01/26/2015 11:04 01/26/2015 16:08 01/26/2015 20:07 01/27/2015 07:15  Glucose-Capillary Latest Ref Range: 65-99 mg/dL 109 (H) 221 (H) 334 (H) 345 (H) 87   Diabetes history: type 2 Outpatient Diabetes medications: Levemir 20 units QHS, Novolog sliding scale Current orders for Inpatient glycemic control: Novolog 0-15 units TID with meals, Novolog 5 units TID with meals for meal coverage, Levemir 20 units QHS  Inpatient Diabetes Program Recommendations Insulin - Meal Coverage: Post prandial glucose continues to be elevated despite Novolog 5 units for meal coverage. If steroids are continued as ordered, please consider increasing meal coverage to Novolog 10 units TID with meals.  Thanks, Barnie Alderman, RN, MSN, CCRN, CDE Diabetes Coordinator Inpatient Diabetes Program [phone] (Team Pager from [place] to [place]) [phone] ([place] office) [phone] [facility] : [facility] office) [phone] [facility] office)

## 2015-02-10 ENCOUNTER — Ambulatory Visit: Payer: Medicare Other

## 2018-06-20 ENCOUNTER — Non-Acute Institutional Stay: Payer: Medicare Other | Admitting: Nurse Practitioner

## 2018-06-20 VITALS — BP 166/89 | HR 82 | Temp 97.0°F | Resp 18 | Ht 70.0 in | Wt 168.3 lb

## 2018-06-20 DIAGNOSIS — R531 Weakness: Secondary | ICD-10-CM

## 2018-06-20 DIAGNOSIS — R601 Generalized edema: Secondary | ICD-10-CM

## 2018-06-20 DIAGNOSIS — R0602 Shortness of breath: Secondary | ICD-10-CM

## 2018-06-20 DIAGNOSIS — Z515 Encounter for palliative care: Secondary | ICD-10-CM | POA: Insufficient documentation

## 2018-06-20 DIAGNOSIS — R630 Anorexia: Secondary | ICD-10-CM
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
## 2018-07-24 ENCOUNTER — Emergency Department: Payer: Medicare Other

## 2018-07-24 ENCOUNTER — Other Ambulatory Visit: Payer: Self-pay

## 2018-07-24 ENCOUNTER — Inpatient Hospital Stay
Admission: EM | Admit: 2018-07-24 | Discharge: 2018-07-28 | DRG: 871 | Disposition: A | Discharge status: Skilled Nursing Facility | Payer: Medicare Other | Attending: Internal Medicine | Admitting: Internal Medicine

## 2018-07-24 DIAGNOSIS — I509 Heart failure, unspecified: Secondary | ICD-10-CM | POA: Diagnosis present

## 2018-07-24 DIAGNOSIS — R6520 Severe sepsis without septic shock: Secondary | ICD-10-CM | POA: Diagnosis present

## 2018-07-24 DIAGNOSIS — F0390 Unspecified dementia without behavioral disturbance: Secondary | ICD-10-CM | POA: Diagnosis present

## 2018-07-24 DIAGNOSIS — Z79899 Other long term (current) drug therapy: Secondary | ICD-10-CM

## 2018-07-24 DIAGNOSIS — N390 Urinary tract infection, site not specified: Secondary | ICD-10-CM

## 2018-07-24 DIAGNOSIS — A419 Sepsis, unspecified organism: Principal | ICD-10-CM

## 2018-07-24 DIAGNOSIS — K219 Gastro-esophageal reflux disease without esophagitis: Secondary | ICD-10-CM | POA: Diagnosis present

## 2018-07-24 DIAGNOSIS — D631 Anemia in chronic kidney disease: Secondary | ICD-10-CM | POA: Diagnosis present

## 2018-07-24 DIAGNOSIS — E875 Hyperkalemia: Secondary | ICD-10-CM | POA: Diagnosis present

## 2018-07-24 DIAGNOSIS — E1142 Type 2 diabetes mellitus with diabetic polyneuropathy: Secondary | ICD-10-CM | POA: Diagnosis present

## 2018-07-24 DIAGNOSIS — N19 Unspecified kidney failure: Secondary | ICD-10-CM

## 2018-07-24 DIAGNOSIS — E1151 Type 2 diabetes mellitus with diabetic peripheral angiopathy without gangrene: Secondary | ICD-10-CM | POA: Diagnosis present

## 2018-07-24 DIAGNOSIS — Z794 Long term (current) use of insulin: Secondary | ICD-10-CM | POA: Diagnosis not present

## 2018-07-24 DIAGNOSIS — N184 Chronic kidney disease, stage 4 (severe): Secondary | ICD-10-CM | POA: Diagnosis present

## 2018-07-24 DIAGNOSIS — Z8249 Family history of ischemic heart disease and other diseases of the circulatory system: Secondary | ICD-10-CM

## 2018-07-24 DIAGNOSIS — E1143 Type 2 diabetes mellitus with diabetic autonomic (poly)neuropathy: Secondary | ICD-10-CM | POA: Diagnosis present

## 2018-07-24 DIAGNOSIS — Z87891 Personal history of nicotine dependence: Secondary | ICD-10-CM

## 2018-07-24 DIAGNOSIS — N3000 Acute cystitis without hematuria: Secondary | ICD-10-CM | POA: Diagnosis present

## 2018-07-24 DIAGNOSIS — Z7902 Long term (current) use of antithrombotics/antiplatelets: Secondary | ICD-10-CM

## 2018-07-24 DIAGNOSIS — N189 Chronic kidney disease, unspecified: Secondary | ICD-10-CM

## 2018-07-24 DIAGNOSIS — E1122 Type 2 diabetes mellitus with diabetic chronic kidney disease: Secondary | ICD-10-CM | POA: Diagnosis present

## 2018-07-24 DIAGNOSIS — R29810 Facial weakness: Secondary | ICD-10-CM | POA: Diagnosis present

## 2018-07-24 DIAGNOSIS — Z515 Encounter for palliative care: Secondary | ICD-10-CM | POA: Diagnosis not present

## 2018-07-24 DIAGNOSIS — Z85038 Personal history of other malignant neoplasm of large intestine: Secondary | ICD-10-CM

## 2018-07-24 DIAGNOSIS — I130 Hypertensive heart and chronic kidney disease with heart failure and stage 1 through stage 4 chronic kidney disease, or unspecified chronic kidney disease: Secondary | ICD-10-CM | POA: Diagnosis present

## 2018-07-24 DIAGNOSIS — N400 Enlarged prostate without lower urinary tract symptoms: Secondary | ICD-10-CM | POA: Diagnosis present

## 2018-07-24 DIAGNOSIS — N179 Acute kidney failure, unspecified: Secondary | ICD-10-CM | POA: Diagnosis present

## 2018-07-24 DIAGNOSIS — J189 Pneumonia, unspecified organism: Secondary | ICD-10-CM | POA: Diagnosis present

## 2018-07-24 DIAGNOSIS — R4182 Altered mental status, unspecified: Secondary | ICD-10-CM | POA: Diagnosis not present

## 2018-07-24 DIAGNOSIS — G9340 Encephalopathy, unspecified: Secondary | ICD-10-CM | POA: Diagnosis present

## 2018-07-24 DIAGNOSIS — Z66 Do not resuscitate: Secondary | ICD-10-CM | POA: Diagnosis present

## 2018-07-24 DIAGNOSIS — J440 Chronic obstructive pulmonary disease with acute lower respiratory infection: Secondary | ICD-10-CM | POA: Diagnosis present

## 2018-07-24 DIAGNOSIS — K3184 Gastroparesis: Secondary | ICD-10-CM | POA: Diagnosis present

## 2018-07-24 DIAGNOSIS — Z7401 Bed confinement status: Secondary | ICD-10-CM

## 2018-07-24 DIAGNOSIS — Z7952 Long term (current) use of systemic steroids: Secondary | ICD-10-CM

## 2018-07-24 HISTORY — DX: Major depressive disorder, single episode, unspecified: F32.9

## 2018-07-24 HISTORY — DX: Myoneural disorder, unspecified: G70.9

## 2018-07-24 HISTORY — DX: Unspecified osteoarthritis, unspecified site: M19.90

## 2018-07-24 HISTORY — DX: Heart failure, unspecified: I50.9

## 2018-07-24 HISTORY — DX: Gastro-esophageal reflux disease without esophagitis: K21.9

## 2018-07-24 HISTORY — DX: Depression, unspecified: F32.A

## 2018-07-24 LAB — CG4 I-STAT (LACTIC ACID): Lactic Acid, Venous: 1.24 mmol/L (ref 0.5–1.9)

## 2018-07-24 LAB — BASIC METABOLIC PANEL
Anion gap: 11 (ref 5–15)
BUN: 98 mg/dL — ABNORMAL HIGH (ref 8–23)
CALCIUM: 7.6 mg/dL — AB (ref 8.9–10.3)
CO2: 14 mmol/L — AB (ref 22–32)
Chloride: 120 mmol/L — ABNORMAL HIGH (ref 98–111)
Creatinine, Ser: 7.34 mg/dL — ABNORMAL HIGH (ref 0.61–1.24)
GFR calc Af Amer: 7 mL/min — ABNORMAL LOW (ref >60)
GFR calc non Af Amer: 6 mL/min — ABNORMAL LOW (ref >60)
Glucose, Bld: 76 mg/dL (ref 70–99)
Potassium: 4.9 mmol/L (ref 3.5–5.1)
Sodium: 145 mmol/L (ref 135–145)

## 2018-07-24 LAB — URINALYSIS, COMPLETE (UACMP) WITH MICROSCOPIC
Bilirubin Urine: NEGATIVE
Glucose, UA: NEGATIVE mg/dL
Ketones, ur: NEGATIVE mg/dL
Nitrite: NEGATIVE
Protein, ur: 100 mg/dL — AB
RBC / HPF: >50 RBC/hpf — ABNORMAL HIGH (ref 0–5)
Specific Gravity, Urine: 1.017 (ref 1.005–1.030)
WBC, UA: >50 WBC/hpf — ABNORMAL HIGH (ref 0–5)
pH: 5 (ref 5.0–8.0)

## 2018-07-24 LAB — COMPREHENSIVE METABOLIC PANEL
ALT: 16 U/L (ref 0–44)
AST: 29 U/L (ref 15–41)
Albumin: 2.6 g/dL — ABNORMAL LOW (ref 3.5–5.0)
Alkaline Phosphatase: 58 U/L (ref 38–126)
Anion gap: 11 (ref 5–15)
BUN: 94 mg/dL — ABNORMAL HIGH (ref 8–23)
CALCIUM: 8.4 mg/dL — AB (ref 8.9–10.3)
CO2: 12 mmol/L — ABNORMAL LOW (ref 22–32)
Chloride: 118 mmol/L — ABNORMAL HIGH (ref 98–111)
Creatinine, Ser: 7.68 mg/dL — ABNORMAL HIGH (ref 0.61–1.24)
GFR calc Af Amer: 7 mL/min — ABNORMAL LOW (ref >60)
GFR calc non Af Amer: 6 mL/min — ABNORMAL LOW (ref >60)
Glucose, Bld: 88 mg/dL (ref 70–99)
Potassium: 5.5 mmol/L — ABNORMAL HIGH (ref 3.5–5.1)
Sodium: 141 mmol/L (ref 135–145)
Total Bilirubin: 0.7 mg/dL (ref 0.3–1.2)
Total Protein: 6.1 g/dL — ABNORMAL LOW (ref 6.5–8.1)

## 2018-07-24 LAB — CBC WITH DIFFERENTIAL/PLATELET
Abs Immature Granulocytes: 0.24 10*3/uL — ABNORMAL HIGH (ref 0.00–0.07)
BASOS PCT: 0 %
Basophils Absolute: 0 10*3/uL (ref 0.0–0.1)
Eosinophils Absolute: 0.2 10*3/uL (ref 0.0–0.5)
Eosinophils Relative: 1 %
HCT: 31.3 % — ABNORMAL LOW (ref 39.0–52.0)
Hemoglobin: 9.6 g/dL — ABNORMAL LOW (ref 13.0–17.0)
Immature Granulocytes: 2 %
Lymphocytes Relative: 3 %
Lymphs Abs: 0.5 10*3/uL — ABNORMAL LOW (ref 0.7–4.0)
MCH: 30 pg (ref 26.0–34.0)
MCHC: 30.7 g/dL (ref 30.0–36.0)
MCV: 97.8 fL (ref 80.0–100.0)
MONO ABS: 0.5 10*3/uL (ref 0.1–1.0)
Monocytes Relative: 4 %
Neutro Abs: 13.8 10*3/uL — ABNORMAL HIGH (ref 1.7–7.7)
Neutrophils Relative %: 90 %
Platelets: 226 10*3/uL (ref 150–400)
RBC: 3.2 MIL/uL — ABNORMAL LOW (ref 4.22–5.81)
RDW: 17.7 % — ABNORMAL HIGH (ref 11.5–15.5)
WBC: 15.3 10*3/uL — ABNORMAL HIGH (ref 4.0–10.5)
nRBC: 0.5 % — ABNORMAL HIGH (ref 0.0–0.2)

## 2018-07-24 LAB — GLUCOSE, CAPILLARY
Glucose-Capillary: 62 mg/dL — ABNORMAL LOW (ref 70–99)
Glucose-Capillary: 97 mg/dL (ref 70–99)
Glucose-Capillary: 72 mg/dL (ref 70–99)

## 2018-07-24 LAB — MRSA PCR SCREENING: MRSA by PCR: NEGATIVE

## 2018-07-24 LAB — INFLUENZA PANEL BY PCR (TYPE A & B)
INFLAPCR: NEGATIVE
Influenza B By PCR: NEGATIVE

## 2018-07-24 LAB — HEMOGLOBIN A1C
Hgb A1c MFr Bld: 4.4 % — ABNORMAL LOW (ref 4.8–5.6)
Mean Plasma Glucose: 79.58 mg/dL

## 2018-07-24 MED ORDER — SODIUM CHLORIDE 0.9 % IV BOLUS
1000.0000 mL | Freq: Once | INTRAVENOUS | Status: DC
  Administered 2018-07-24: 1000 mL via INTRAVENOUS

## 2018-07-24 MED ORDER — SODIUM CHLORIDE 0.9 % IV SOLN
1.0000 g | INTRAVENOUS | Status: DC
  Filled 2018-07-24: qty 10

## 2018-07-24 MED ORDER — ACETAMINOPHEN 650 MG RE SUPP: 650.0000 mg | Freq: Four times a day (QID) | RECTAL | Status: DC | PRN

## 2018-07-24 MED ORDER — SODIUM CHLORIDE 0.9 % IV SOLN
1.0000 g | Freq: Once | INTRAVENOUS | Status: DC
  Filled 2018-07-24: qty 10

## 2018-07-24 MED ORDER — DEXTROSE 50 % IV SOLN
25.0000 mL | Freq: Once | INTRAVENOUS | Status: AC
  Administered 2018-07-24: 25 mL via INTRAVENOUS

## 2018-07-24 MED ORDER — SODIUM POLYSTYRENE SULFONATE 15 GM/60ML PO SUSP
30.0000 g | Freq: Once | ORAL | Status: AC
  Administered 2018-07-24: 30 g via RECTAL
  Filled 2018-07-24: qty 120

## 2018-07-24 MED ORDER — SODIUM ZIRCONIUM CYCLOSILICATE 10 G PO PACK
10.0000 g | PACK | Freq: Once | ORAL | Status: DC
  Filled 2018-07-24: qty 1

## 2018-07-24 MED ORDER — HEPARIN SODIUM (PORCINE) 5000 UNIT/ML IJ SOLN
5000.0000 [IU] | Freq: Three times a day (TID) | INTRAMUSCULAR | Status: DC
  Administered 2018-07-24: 5000 [IU] via SUBCUTANEOUS
  Filled 2018-07-24: qty 1

## 2018-07-24 MED ORDER — COLLAGENASE 250 UNIT/GM EX OINT
1.0000 "application " | TOPICAL_OINTMENT | Freq: Every day | CUTANEOUS | Status: DC
  Administered 2018-07-24: 1 via TOPICAL
  Filled 2018-07-24: qty 30

## 2018-07-24 MED ORDER — ACETAMINOPHEN 325 MG PO TABS: 650.0000 mg | ORAL_TABLET | Freq: Four times a day (QID) | ORAL | Status: DC | PRN

## 2018-07-24 MED ORDER — SODIUM BICARBONATE 8.4 % IV SOLN
50.0000 meq | Freq: Once | INTRAVENOUS | Status: AC
  Administered 2018-07-24: 50 meq via INTRAVENOUS
  Filled 2018-07-24: qty 50

## 2018-07-24 MED ORDER — SODIUM CHLORIDE 0.9 % IV SOLN
INTRAVENOUS | Status: DC
  Administered 2018-07-24 (×2): via INTRAVENOUS

## 2018-07-24 MED ORDER — INSULIN ASPART 100 UNIT/ML ~~LOC~~ SOLN: 0.0000 [IU] | Freq: Three times a day (TID) | SUBCUTANEOUS | Status: DC

## 2018-07-24 MED ORDER — MORPHINE SULFATE (CONCENTRATE) 10 MG/0.5ML PO SOLN: 5.0000 mg | ORAL | Status: DC | PRN

## 2018-07-24 MED ORDER — DEXTROSE 50 % IV SOLN
INTRAVENOUS | Status: AC
  Filled 2018-07-24: qty 50

## 2018-07-24 MED ORDER — INSULIN ASPART 100 UNIT/ML ~~LOC~~ SOLN
5.0000 [IU] | Freq: Once | SUBCUTANEOUS | Status: AC
  Administered 2018-07-24: 5 [IU] via INTRAVENOUS
  Filled 2018-07-24: qty 1

## 2018-07-24 MED ORDER — DEXTROSE 5 % IV SOLN
250.0000 mg | INTRAVENOUS | Status: DC
  Filled 2018-07-24: qty 250

## 2018-07-24 MED ORDER — ONDANSETRON HCL 4 MG PO TABS: 4.0000 mg | ORAL_TABLET | Freq: Four times a day (QID) | ORAL | Status: DC | PRN

## 2018-07-24 MED ORDER — LORAZEPAM 2 MG/ML IJ SOLN: 1.0000 mg | INTRAMUSCULAR | Status: DC | PRN

## 2018-07-24 MED ORDER — SODIUM CHLORIDE 0.9 % IV BOLUS
1000.0000 mL | Freq: Once | INTRAVENOUS | Status: AC
  Administered 2018-07-24: 1000 mL via INTRAVENOUS

## 2018-07-24 MED ORDER — GLYCOPYRROLATE 0.2 MG/ML IJ SOLN
0.1000 mg | INTRAMUSCULAR | Status: DC | PRN
  Administered 2018-07-25 – 2018-07-28 (×6): 0.1 mg via INTRAVENOUS
  Filled 2018-07-24 (×7): qty 0.5

## 2018-07-24 MED ORDER — ONDANSETRON HCL 4 MG/2ML IJ SOLN: 4.0000 mg | Freq: Four times a day (QID) | INTRAMUSCULAR | Status: DC | PRN

## 2018-07-24 MED ORDER — SODIUM CHLORIDE 0.9 % IV SOLN
500.0000 mg | Freq: Once | INTRAVENOUS | Status: AC
  Administered 2018-07-24: 500 mg via INTRAVENOUS
  Filled 2018-07-24: qty 500

## 2018-07-24 MED ORDER — INSULIN ASPART 100 UNIT/ML ~~LOC~~ SOLN: 0.0000 [IU] | Freq: Every day | SUBCUTANEOUS | Status: DC

## 2018-07-24 MED ORDER — DEXTROSE 50 % IV SOLN
1.0000 | Freq: Once | INTRAVENOUS | Status: AC
  Administered 2018-07-24: 50 mL via INTRAVENOUS
  Filled 2018-07-24: qty 50

## 2018-07-24 NOTE — Progress Notes (Signed)
Hypoglycemic Event  CBG: 62  Treatment: 27ml D 50% IV per protocol  Symptoms: asymptomatic  Follow-up CBG: [id number] CBG Result:97  Possible Reasons for Event: Patient NPO    Linard Millers

## 2018-07-24 NOTE — ED Notes (Signed)
Stuck X3 unsuccessfully.

## 2018-07-24 NOTE — ED Notes (Signed)
Bladder scan 31ml. Dr Alfred Levins informed.

## 2018-07-24 NOTE — ED Notes (Signed)
Patient transported to CT 
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
## 2018-07-24 NOTE — ED Notes (Signed)
Attempted to call report, calling back in 5 min

## 2018-07-24 NOTE — Progress Notes (Signed)
Patient admission completed as able due to patient being lethargic.  Admission information taken from packet and fact that patient resides in SNF.

## 2018-07-24 NOTE — Progress Notes (Signed)
Patient ID: Alan Mcintyre, male   DOB: Aug 06, 1934, 84 y.o.   MRN: [id number]  Spoke with critical care charge nurse.  Blood pressures have come up with IV fluid hydration.  Will change status to floor care with telemetry.  Dr. Loletha Grayer

## 2018-07-24 NOTE — Progress Notes (Signed)
SLP Cancellation Note  Patient Details Name: Alan Mcintyre MRN: [id number] DOB: July 04, 1935   Cancelled treatment:       Reason Eval/Treat Not Completed: Patient not medically ready;Medical issues which prohibited therapy(chart reviewed). Per chart notes, pt is lethargic and responds to sternal rub only; low temperature w/ bear hugger placed.  ST services will f/u tomorrow for BSE when pt is more alert, safe for assessment. Recommend oral care for hygiene and stimulation of swallowing; aspiration precautions.     Orinda Kenner, MS, CCC-SLP Zakari Couchman 07/24/2018, 4:51 PM
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
## 2018-07-24 NOTE — Progress Notes (Signed)
Patients daughter, [name], updated on patient status. All questions answered. Will continue to monitor.   Iran Sizer M

## 2018-07-24 NOTE — Progress Notes (Signed)
[facility] [place], [place] 07/24/18  Subjective:   Patient known to our practice from outpatient follow-up.  He was last seen in March 2019.  He now presents to the emergency room via EMS from [facility] care for altered mental status.  Patient is not able to provide any meaningful information.  Did not respond to voice or tactile stimulus.  Upon admission, creatinine increased to 7.34, GFR 7.  Potassium is normal at 4.9.  Last known outpatient labs showed a creatinine of 3.07/GFR 21 from August 13, 2017  Objective:  Vital signs in last 24 hours:  Temp:  [90 F (32.2 C)-90.3 F (32.4 C)] 90 F (32.2 C) (01/09 1000) Pulse Rate:  [52-63] 63 (01/09 1200) Resp:  [12-15] 14 (01/09 1115) BP: (83-124)/(42-67) 124/59 (01/09 1430) SpO2:  [92 %-100 %] 98 % (01/09 1200) Weight:  [90.7 kg] 90.7 kg (01/09 0720)  Weight change:  Filed Weights   07/24/18 0720  Weight: 90.7 kg    Intake/Output:    Intake/Output Summary (Last 24 hours) at 07/24/2018 1503 Last data filed at 07/24/2018 1116 Gross per 24 hour  Intake 1250 ml  Output -  Net 1250 ml     Physical Exam: General:  Chronically ill-appearing, laying in the bed  HEENT  dry oral mucous membranes  Neck  no distended neck veins  Pulm/lungs  room air, normal breathing effort, coarse breath sounds  CVS/Heart  regular, no rub  Abdomen:   Soft, nontender, nondistended  Extremities:  Trace to 1+ pitting edema right greater than left  Neurologic:  Obtunded, not able to respond to voice or tactile stimulus  Skin:  Dry skin     Basic Metabolic Panel:  Recent Labs  Lab 07/24/18 0741  NA 141  K 5.5*  CL 118*  CO2 12*  GLUCOSE 88  BUN 94*  CREATININE 7.68*  CALCIUM 8.4*     CBC: Recent Labs  Lab 07/24/18 0741  WBC 15.3*  NEUTROABS 13.8*  HGB 9.6*  HCT 31.3*  MCV 97.8  PLT 226     No results found for: HEPBSAG, HEPBSAB, HEPBIGM    Microbiology:  No results found for this or any  previous visit (from the past 240 hour(s)).  Coagulation Studies: No results for input(s): LABPROT, INR in the last 72 hours.  Urinalysis: Recent Labs    07/24/18 0741  COLORURINE AMBER*  LABSPEC 1.017  PHURINE 5.0  GLUCOSEU NEGATIVE  HGBUR LARGE*  BILIRUBINUR NEGATIVE  KETONESUR NEGATIVE  PROTEINUR 100*  NITRITE NEGATIVE  LEUKOCYTESUR LARGE*      Imaging: Dg Abdomen 1 View  Result Date: 07/24/2018 CLINICAL DATA:  Abdominal pain. EXAM: ABDOMEN - 1 VIEW COMPARISON:  Radiographs of June 19, 2013. FINDINGS: The bowel gas pattern is normal. Status post cholecystectomy. No radio-opaque calculi or other significant radiographic abnormality are seen. IMPRESSION: No evidence of bowel obstruction or ileus. Electronically Signed   By: Marijo Conception, M.D.   On: 07/24/2018 08:41   Ct Head Wo Contrast  Result Date: 07/24/2018 CLINICAL DATA:  LEFT side facial droop, last seen normal 2300 hours, history of colon cancer, chronic kidney disease, COPD, dementia, type 2 diabetes mellitus, hypertension EXAM: CT HEAD WITHOUT CONTRAST TECHNIQUE: Contiguous axial images were obtained from the base of the skull through the vertex without intravenous contrast. Sagittal and coronal MPR images reconstructed from axial data set. COMPARISON:  01/20/2015 FINDINGS: Brain: Generalized atrophy. Normal ventricular morphology. No midline shift or mass effect. Small vessel chronic ischemic  changes of deep cerebral white matter. No intracranial hemorrhage, mass lesion, evidence of acute infarction, or extra-axial fluid collection. Vascular: Atherosclerotic calcifications of internal carotid and vertebral arteries at skull base Skull: Intact Sinuses/Orbits: Clear Other: N/A IMPRESSION: Atrophy with small vessel chronic ischemic changes of deep cerebral white matter. No acute intracranial abnormalities. Electronically Signed   By: Lavonia Dana M.D.   On: 07/24/2018 08:30   Dg Chest Portable 1 View  Result Date:  07/24/2018 CLINICAL DATA:  AMS EXAM: PORTABLE CHEST 1 VIEW COMPARISON:  01/25/2015. FINDINGS: Mediastinum hilar structures normal. Cardiomegaly with normal pulmonary vascularity. Left base infiltrate consistent pneumonia. Small left pleural effusion. IMPRESSION: 1.  Left base infiltrate consistent with pneumonia. 2.  Small left pleural effusion. Electronically Signed   By: Marcello Moores  Register   On: 07/24/2018 08:40     Medications:   . sodium chloride 125 mL/hr at 07/24/18 1115  . [START ON 07/25/2018] azithromycin    . cefTRIAXone (ROCEPHIN)  IV 200 mL/hr at 07/24/18 0851  . [START ON 07/25/2018] cefTRIAXone (ROCEPHIN)  IV     . collagenase  1 application Topical Daily  . heparin  5,000 Units Subcutaneous Q8H   acetaminophen **OR** acetaminophen, ondansetron **OR** ondansetron (ZOFRAN) IV  Assessment/ Plan:  84 y.o. African-American male with peripheral vascular disease, COPD, diabetes, hypertension, dementia, BPH, history of colon cancer, peripheral neuropathy, gastroparesis, nonambulatory status presents from [facility] for altered mental status and is found to have left base infiltrate on chest x-ray consistent with pneumonia  Acute kidney injury on chronic kidney disease stage IV with Hyperkalemia Last known creatinine 3.07/GFR 21 from January 2019 No other lab information is available at this time.  Differential includes progression to advanced chronic kidney disease versus acute kidney injury on chronic kidney disease stage IV from ATN/pneumonia Agree with conservative management with IV hydration and treating underlying pneumonia to see if renal function improves.  With patient's poor functional status he would not make a good candidate for outpatient dialysis.  Palliative care is more appropriate.   LOS: 0 Teron Blais 1/9/20203:03 PM  [place], [place]  Note: This note was prepared with Dragon dictation. Any transcription  errors are unintentional

## 2018-07-24 NOTE — ED Notes (Signed)
Stuck X 2 by this RN-unsuccessful PIV. Blood sent.

## 2018-07-24 NOTE — Progress Notes (Addendum)
Patient admitted to 2A 237 from ED. This RN received report. Patient from [facility]. Tele placed on patient.  Patient letharic.  Moans with sternal rub.  Patient noted to have temp 95.5 rectally.  Bear hugger in place as per orders. NPO at this time. Bed alarm on for safety. This RN spoke to facility and they state patient is alert to self normally and can verbalize simple things slowly.

## 2018-07-24 NOTE — ED Triage Notes (Signed)
Pt to ED via EMS from [facility]. Pt arrives AMS. Per facility pt has normal left sided facial droop but it is worse today. Pt has no hx of CVA. LKW time 1100 last night. Pt responds to voice but is unable to communicate. Per facility pt is normally able to communicate and follow commands at baseline.

## 2018-07-24 NOTE — NC FL2 (Signed)
[place] LEVEL OF CARE SCREENING TOOL     IDENTIFICATION  Patient Name: Alan Mcintyre Birthdate: 07-01-1935 Sex: male Admission Date (Current Location): 07/24/2018  [place] and [place] Number:  Engineering geologist and Address:  [facility], [address], [place], [place] [address]      Provider Number: [id number]  Attending Physician Name and Address:  Loletha Grayer, MD  Relative Name and Phone Number:       Current Level of Care: Hospital Recommended Level of Care: [facility] Prior Approval Number:    Date Approved/Denied:   PASRR Number: [id number]  Discharge Plan: SNF    Current Diagnoses: Patient Active Problem List   Diagnosis Date Noted  . Anorexia 06/20/2018  . Generalized edema 06/20/2018  . Shortness of breath 06/20/2018  . Palliative care encounter 06/20/2018  . Weakness generalized 06/20/2018  . ARF (acute renal failure) ([place]) 01/18/2015  . Aspiration pneumonia ([place]) 01/18/2015  . Acute encephalopathy 01/18/2015  . Sepsis ([place]) 01/04/2015  . UTI (lower urinary tract infection) 01/04/2015  . Hypothermia 01/04/2015  . Hypernatremia 01/04/2015  . Decubitus ulcer of heel, stage 2 ([place]) 02/13/2014  . Decubitus ulcer of heel, stage 3 ([place]) 02/13/2014  . Decubitus ulcer of sacral region, unstageable ([place]) 02/13/2014  . COPD (chronic obstructive pulmonary disease) ([place])   . CKD (chronic kidney disease) stage 3, GFR 30-59 ml/min (HCC)   . Diabetes mellitus without complication ([place])   . Hypertension   . Anemia   . Dementia without behavioral disturbance ([place])   . Neuropathy due to type 2 diabetes mellitus ([place])   . Diabetic gastroparesis associated with type 2 diabetes mellitus ([place])   . PVD (peripheral vascular disease) ([place])     Orientation RESPIRATION BLADDER Height & Weight     Self  Normal Incontinent Weight: 200 lb (90.7 kg) Height:  6\' 2"  (188 cm)  BEHAVIORAL SYMPTOMS/MOOD NEUROLOGICAL  BOWEL NUTRITION STATUS      Incontinent Diet(Diabetic)  AMBULATORY STATUS COMMUNICATION OF NEEDS Skin   Limited Assist Verbally                         Personal Care Assistance Level of Assistance  Bathing, Feeding, Dressing, Total care Bathing Assistance: Limited assistance Feeding assistance: Limited assistance Dressing Assistance: Limited assistance Total Care Assistance: Limited assistance   Functional Limitations Info  Sight, Hearing, Speech Sight Info: Adequate Hearing Info: Adequate Speech Info: Adequate    SPECIAL CARE FACTORS FREQUENCY                       Contractures Contractures Info: Not present    Additional Factors Info  Code Status, Insulin Sliding Scale, Isolation Precautions(DNR) Code Status Info: DNR     Insulin Sliding Scale Info: See MAR Isolation Precautions Info: Droplet precautions     Current Medications (07/24/2018):  This is the current hospital active medication list Current Facility-Administered Medications  Medication Dose Route Frequency Provider Last Rate Last Dose  . azithromycin (ZITHROMAX) 500 mg in sodium chloride 0.9 % 250 mL IVPB  500 mg Intravenous Once Alfred Levins, [place], MD 250 mL/hr at 07/24/18 0945 500 mg at 07/24/18 0945  . cefTRIAXone (ROCEPHIN) 1 g in sodium chloride 0.9 % 100 mL IVPB  1 g Intravenous Once Alfred Levins, [place], MD 200 mL/hr at 07/24/18 0851    . sodium chloride 0.9 % bolus 1,000 mL  1,000 mL Intravenous Once Alfred Levins, [place], MD      .  sodium zirconium cyclosilicate (LOKELMA) packet 10 g  10 g Oral Once Rudene Re, MD       Current Outpatient Medications  Medication Sig Dispense Refill  . amLODipine (NORVASC) 10 MG tablet Take 10 mg by mouth daily.    . cholecalciferol (VITAMIN D) 1000 UNITS tablet TAKE 1 TABLET BY MOUTH ONCE DAILY. (Patient taking differently: Take 2,000 Units by mouth daily. ) 30 tablet 3  . clopidogrel (PLAVIX) 75 MG tablet Take 75 mg by mouth daily.    . collagenase  (SANTYL) ointment Apply 1 application topically daily. Apply to diabetic ulcer on right heel along with calcium alginate    . divalproex (DEPAKOTE SPRINKLE) 125 MG capsule Take 125 mg by mouth 2 (two) times daily.    . ferrous sulfate 325 (65 FE) MG tablet Take 325 mg by mouth 3 (three) times daily.    . furosemide (LASIX) 40 MG tablet Take 40 mg by mouth every other day.    . gabapentin (NEURONTIN) 300 MG capsule Take 300 mg by mouth 2 (two) times daily.    . hydrALAZINE (APRESOLINE) 50 MG tablet Take 50 mg by mouth 3 (three) times daily. (hold for SBP <100)    . isosorbide mononitrate (IMDUR) 30 MG 24 hr tablet Take 30 mg by mouth daily.    . Lidocaine HCl (ASPERCREME W/LIDOCAINE) 4 % CREA Apply 1 application topically daily.    . magnesium oxide (MAG-OX) 400 MG tablet Take 400 mg by mouth daily.    . metoprolol tartrate (LOPRESSOR) 25 MG tablet Take 0.5 tablets (12.5 mg total) by mouth 2 (two) times daily. 30 tablet 0  . Multiple Vitamins-Minerals (MULTIVITAMIN WITH MINERALS) tablet Take 1 tablet by mouth daily.    . pantoprazole (PROTONIX) 40 MG tablet Take 40 mg by mouth daily.    . polyethylene glycol (MIRALAX / GLYCOLAX) packet Take 17 g by mouth 2 (two) times daily.     . Probiotic Product (PROBIOTIC COLON SUPPORT) CAPS Take 1 capsule by mouth 3 (three) times daily.    Marland Kitchen senna (SENOKOT) 8.6 MG TABS tablet Take 2 tablets by mouth 2 (two) times daily.    . tamsulosin (FLOMAX) 0.4 MG CAPS capsule Take 0.4 mg by mouth daily.    . insulin aspart (NOVOLOG) 100 UNIT/ML injection Inject 5 Units into the skin 3 (three) times daily with meals. 10 mL 11  . insulin detemir (LEVEMIR) 100 UNIT/ML injection Inject 0.2 mLs (20 Units total) into the skin at bedtime. 10 mL 11  . predniSONE (STERAPRED UNI-PAK 21 TAB) 10 MG (21) TBPK tablet Take 1 tablet (10 mg total) by mouth daily. Three tabs  for 2 days Two tabs  for 2 days  one tab for two days and stop 12 tablet 0     Discharge Medications: Please  see discharge summary for a list of discharge medications.  Relevant Imaging Results:  Relevant Lab Results:   Additional Information SSN [id number]  [name], [place]

## 2018-07-24 NOTE — Progress Notes (Signed)
Received a call from RN that patient is clinically worsening this evening.  Most recent blood pressure is 75/20, Temperature 95.3 F.  Patient has been unresponsive throughout the day today.  Does not even respond to sternal rub.  I called the patient's daughter over the phone and had a long discussion with her regarding the patient's status and goals of care.  I explained that patient was getting worse.  His daughter states that she has already been preparing for the possibility that her father may not make it through the night.  I discussed transitioning patient over to full comfort care at this time.  Patient's daughter is agreeable and she states that she does not want to see her father suffering.  All questions answered.  Will stop all unnecessary medicines and transition patient over to full comfort care medicines.  Hyman Bible, MD [facility]

## 2018-07-24 NOTE — Progress Notes (Signed)
CODE SEPSIS - PHARMACY COMMUNICATION  **Broad Spectrum Antibiotics should be administered within 1 hour of Sepsis diagnosis**  Time Code Sepsis Called/Page Received: @ 0845  Antibiotics Ordered: Ceftriaxone 1g and Azithromycin 500mg   Time of 1st antibiotic administration: @ [phone]  Additional action taken by pharmacy: N/A  If necessary, Name of Provider/Nurse Contacted: N/A   Pernell Dupre, PharmD, [place] Pharmacist 07/24/2018 9:23 AM

## 2018-07-24 NOTE — Clinical Social Work Note (Signed)
Clinical Social Work Assessment  Patient Details  Name: Alan Mcintyre MRN: [id number] Date of Birth: 12-20-1934  Date of referral:  07/24/18               Reason for consult:  Other (Comment Required)(From [facility])                Permission sought to share information with:  Facility Sport and exercise psychologist, Family Supports Permission granted to share information::  Yes, Verbal Permission Granted  Name::     Daughter Alan Mcintyre [phone]  Agency::  [facility]  Relationship::     Contact Information:     Housing/Transportation Living arrangements for the past 2 months:  [place] of Information:  Medical Team, Patient Patient Interpreter Needed:  None Criminal Activity/Legal Involvement Pertinent to Current Situation/Hospitalization:  No - Comment as needed Significant Relationships:  Adult Children, Friend Lives with:  Facility Resident Do you feel safe going back to the place where you live?  Yes Need for family participation in patient care:  Yes (Comment)  Care giving concerns:  TBD   Social Worker assessment / plan: LCSW introduced myself to patient ( He is oriented x1 and fell back asleep. Collected information from medical team and his chart sheet from [facility] where he resides. He has a daughter Alan Mcintyre [phone]. Patient has stage 4 kidney disease, Diabetic- insulin and has dementia. His insurance is medicare/meidcaid. He has a current DNR- He needs full assist with all his ADLs. Patient will be admitted.  Employment status:  Retired Forensic scientist:  Information systems manager, Medicaid In [place] PT Recommendations:  Not assessed at this time Information / Referral to community resources:   None              Patient/Family's Response to care:  TBD  Patient/Family's Understanding of and Emotional Response to Diagnosis, Current Treatment, and Prognosis:  TBD  Emotional Assessment Appearance:  Appears stated  age Attitude/Demeanor/Rapport:  Unable to Assess Affect (typically observed):  Unable to Assess Orientation:    Alcohol / Substance use:  Not Applicable Psych involvement (Current and /or in the community):  No (Comment)  Discharge Needs  Concerns to be addressed:  Care Coordination Readmission within the last 30 days:  No Current discharge risk:  None Barriers to Discharge:  Continued Medical Work up   [name], LCSW 07/24/2018, 9:34 AM

## 2018-07-25 DIAGNOSIS — A419 Sepsis, unspecified organism: Principal | ICD-10-CM

## 2018-07-25 DIAGNOSIS — N390 Urinary tract infection, site not specified: Secondary | ICD-10-CM

## 2018-07-25 DIAGNOSIS — Z515 Encounter for palliative care: Secondary | ICD-10-CM

## 2018-07-25 LAB — URINE CULTURE: Culture: NO GROWTH

## 2018-07-25 MED ORDER — MORPHINE SULFATE (PF) 2 MG/ML IV SOLN
2.0000 mg | INTRAVENOUS | Status: DC | PRN
  Administered 2018-07-25: 2 mg via INTRAVENOUS
  Filled 2018-07-25: qty 1

## 2018-07-25 MED ORDER — MORPHINE SULFATE 2 MG/ML IJ SOLN: 2.0000 mg | INTRAMUSCULAR | Status: DC | PRN

## 2018-07-25 MED ORDER — MORPHINE SULFATE (PF) 4 MG/ML IV SOLN: 4.0000 mg | INTRAVENOUS | Status: DC | PRN
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
## 2018-07-25 NOTE — Plan of Care (Signed)
°  Problem: Respiratory: °Goal: Ability to maintain adequate ventilation will improve °Outcome: Progressing °  °

## 2018-07-25 NOTE — Progress Notes (Signed)
Patient ID: Alan Mcintyre, male   DOB: [birth year], 84 y.o.   MRN: [id number]  [facility] PROGRESS NOTE  Alan Mcintyre FYB:[id number] DOB: 04/10/1935 DOA: 07/24/2018 PCP: Lorelee Market, MD  HPI/Subjective: Patient unresponsive with sternal rub.  Overnight the patient was made comfort care secondary to low blood pressure.  This morning his blood pressure is a little bit better at 4 AM.  Try to reach the daughter on the phone but unable to reach at this time.  Unable to leave a message.  Objective: Vitals:   07/24/18 1938 07/25/18 0411  BP: (!) 75/20 (!) 103/49  Pulse: 80 91  Resp:  18  Temp: (!) 95.3 F (35.2 C) 98.3 F (36.8 C)  SpO2:  96%    Filed Weights   07/24/18 0720 07/24/18 1545 07/25/18 0411  Weight: 90.7 kg 77.8 kg 77.5 kg    ROS: Review of Systems  Unable to perform ROS: Acuity of condition   Exam: Physical Exam  HENT:  Nose: No mucosal edema.  Mouth/Throat: No oropharyngeal exudate.  Dry  Eyes: Conjunctivae and lids are normal.  Eyes rolled back into his head  Neck: Carotid bruit is not present.  Cardiovascular: Regular rhythm, S1 normal, S2 normal and normal heart sounds.  Respiratory: He has decreased breath sounds in the right lower field and the left lower field. He has rhonchi in the right lower field and the left lower field.  GI: Soft. Bowel sounds are normal. There is no abdominal tenderness.  Musculoskeletal:     Right ankle: He exhibits swelling.     Left ankle: He exhibits swelling.  Neurological:  Unresponsive to sternal rub  Skin: Skin is warm. No rash noted.  Stage II decubiti right heel  Psychiatric:  Unresponsive to sternal rub      Data Reviewed: Basic Metabolic Panel: Recent Labs  Lab 07/24/18 0741 07/24/18 1433  NA 141 145  K 5.5* 4.9  CL 118* 120*  CO2 12* 14*  GLUCOSE 88 76  BUN 94* 98*  CREATININE 7.68* 7.34*  CALCIUM 8.4* 7.6*   Liver Function Tests: Recent Labs  Lab 07/24/18 0741  AST 29  ALT 16   ALKPHOS 58  BILITOT 0.7  PROT 6.1*  ALBUMIN 2.6*   CBC: Recent Labs  Lab 07/24/18 0741  WBC 15.3*  NEUTROABS 13.8*  HGB 9.6*  HCT 31.3*  MCV 97.8  PLT 226    CBG: Recent Labs  Lab 07/24/18 1722 07/24/18 1825 07/24/18 2004  GLUCAP 62* 97 72    Recent Results (from the past 240 hour(s))  Blood culture (routine x 2)     Status: None (Preliminary result)   Collection Time: 07/24/18  8:49 AM  Result Value Ref Range Status   Specimen Description BLOOD BLOOD LEFT FOREARM  Final   Special Requests   Final    BOTTLES DRAWN AEROBIC AND ANAEROBIC Blood Culture adequate volume   Culture   Final    NO GROWTH < 24 HOURS Performed at [facility], [address] [place], [place] [address]    Report Status PENDING  Incomplete  Blood culture (routine x 2)     Status: None (Preliminary result)   Collection Time: 07/24/18  8:49 AM  Result Value Ref Range Status   Specimen Description BLOOD BLOOD RIGHT HAND  Final   Special Requests   Final    BOTTLES DRAWN AEROBIC AND ANAEROBIC Blood Culture adequate volume   Culture   Final    NO GROWTH <  66 HOURS Performed at [facility], [address] [place], [place] [address]    Report Status PENDING  Incomplete  MRSA PCR Screening     Status: None   Collection Time: 07/24/18  5:23 PM  Result Value Ref Range Status   MRSA by PCR NEGATIVE NEGATIVE Final    Comment:        The GeneXpert MRSA Assay (FDA approved for NASAL specimens only), is one component of a comprehensive MRSA colonization surveillance program. It is not intended to diagnose MRSA infection nor to guide or monitor treatment for MRSA infections. Performed at [facility], [address] [place], [place] [address]      Studies: Dg Abdomen 1 View  Result Date: 07/24/2018 CLINICAL DATA:  Abdominal pain. EXAM: ABDOMEN - 1 VIEW COMPARISON:  Radiographs of June 19, 2013. FINDINGS: The bowel gas pattern is normal. Status post  cholecystectomy. No radio-opaque calculi or other significant radiographic abnormality are seen. IMPRESSION: No evidence of bowel obstruction or ileus. Electronically Signed   By: Marijo Conception, M.D.   On: 07/24/2018 08:41   Ct Head Wo Contrast  Result Date: 07/24/2018 CLINICAL DATA:  LEFT side facial droop, last seen normal 2300 hours, history of colon cancer, chronic kidney disease, COPD, dementia, type 2 diabetes mellitus, hypertension EXAM: CT HEAD WITHOUT CONTRAST TECHNIQUE: Contiguous axial images were obtained from the base of the skull through the vertex without intravenous contrast. Sagittal and coronal MPR images reconstructed from axial data set. COMPARISON:  01/20/2015 FINDINGS: Brain: Generalized atrophy. Normal ventricular morphology. No midline shift or mass effect. Small vessel chronic ischemic changes of deep cerebral white matter. No intracranial hemorrhage, mass lesion, evidence of acute infarction, or extra-axial fluid collection. Vascular: Atherosclerotic calcifications of internal carotid and vertebral arteries at skull base Skull: Intact Sinuses/Orbits: Clear Other: N/A IMPRESSION: Atrophy with small vessel chronic ischemic changes of deep cerebral white matter. No acute intracranial abnormalities. Electronically Signed   By: Lavonia Dana M.D.   On: 07/24/2018 08:30   Dg Chest Portable 1 View  Result Date: 07/24/2018 CLINICAL DATA:  AMS EXAM: PORTABLE CHEST 1 VIEW COMPARISON:  01/25/2015. FINDINGS: Mediastinum hilar structures normal. Cardiomegaly with normal pulmonary vascularity. Left base infiltrate consistent pneumonia. Small left pleural effusion. IMPRESSION: 1.  Left base infiltrate consistent with pneumonia. 2.  Small left pleural effusion. Electronically Signed   By: Marcello Moores  Register   On: 07/24/2018 08:40    Scheduled Meds: Continuous Infusions:  Assessment/Plan:  1. Severe sepsis with hypotension and acute kidney injury.  Patient also had leukocytosis and hypothermia.   Blood pressure dropped down last night and patient was made comfort care measures.  Blood pressure at 4 AM was around 100.  Asked the nursing staff to recheck her blood pressure.  Unable to contact the daughter at this time.  Unable to leave a message on her phone.  Repeat blood pressure this morning systolic pressure 91. 2. Acute kidney injury on chronic kidney disease.  Since the patient was made comfort care labs were not drawn this morning. 3. Hyperkalemia improved last night 4. Pneumonia, acute cystitis.  The patient was initially started on Rocephin and Zithromax.  Since the was made comfort care antibiotics were stopped. 5. Type 2 diabetes mellitus with gastroparesis 6. Dementia 7. BPH  Palliative care consultation  Code Status:     Code Status Orders  (From admission, onward)         Start     Ordered   07/24/18  1009  Do not attempt resuscitation (DNR)  Continuous    Question Answer Comment  In the event of cardiac or respiratory ARREST Do not call a "code blue"   In the event of cardiac or respiratory ARREST Do not perform Intubation, CPR, defibrillation or ACLS   In the event of cardiac or respiratory ARREST Use medication by any route, position, wound care, and other measures to relive pain and suffering. May use oxygen, suction and manual treatment of airway obstruction as needed for comfort.   Comments nurse may pronounce      07/24/18 1009        Code Status History    Date Active Date Inactive Code Status Order ID Comments User Context   07/24/2018 0958 07/24/2018 1009 DNR [id number]  Loletha Grayer, MD ED   01/21/2015 2119 01/27/2015 1751 DNR [id number]  Colleen Can, MD Inpatient   01/17/2015 1748 01/21/2015 2119 Full Code [id number]  Dustin Flock, MD Inpatient   01/04/2015 1747 01/09/2015 1443 Full Code [id number]  Theodoro Grist, MD Inpatient   02/13/2014 1329 01/04/2015 1747 Full Code [id number]  Hennie Duos, MD Outpatient   01/13/2014 0704 02/08/2014 1719 Full  Code [id number]  Janyth Pupa Inpatient   05/06/2013 1554 05/29/2013 1746 Full Code [id number]  Meda Coffee Inpatient     Family Communication: Tried to call the daughter on the phone but there was no answer and I was unable to leave a message.   Disposition Plan: Patient made comfort care measures  Consultants:  Nephrology  Palliative care  Time spent: 25 minutes  [place]

## 2018-07-25 NOTE — Progress Notes (Signed)
We are able to get daughter on the phone to facetime patient, per daughter's request Alan Mcintyre) who lives in [place]. Daughter was tearful and was thankful to nursing staff for being able to see his father through the phone. Dr. Regenia Skeeter aware.

## 2018-07-26 NOTE — Progress Notes (Signed)
Patient ID: Alan Mcintyre, male   DOB: 12/03/1934, 84 y.o.   MRN: [id number]  [facility] PROGRESS NOTE  Alan Mcintyre [id number]:[id number] DOB: 02/07/1935 DOA: 07/24/2018 PCP: Lorelee Market, MD  HPI/Subjective: Patient arousable to verbal commands today but very lethargic. The  patient was made comfort care secondary to low blood pressure.  No family members at bedside  Objective: Vitals:   07/25/18 1948 07/25/18 2100  BP: (!) 104/52   Pulse: 78   Resp: 20   Temp:  (!) 95.2 F (35.1 C)  SpO2: 96%     Filed Weights   07/24/18 0720 07/24/18 1545 07/25/18 0411  Weight: 90.7 kg 77.8 kg 77.5 kg    ROS: Review of Systems  Unable to perform ROS: Acuity of condition   Exam: Physical Exam  HENT:  Nose: No mucosal edema.  Mouth/Throat: No oropharyngeal exudate.  Dry  Eyes: Conjunctivae and lids are normal.  Eyes rolled back into his head  Neck: Carotid bruit is not present.  Cardiovascular: Regular rhythm, S1 normal, S2 normal and normal heart sounds.  Respiratory: He has decreased breath sounds in the right lower field and the left lower field. He has rhonchi in the right lower field and the left lower field.  GI: Soft. Bowel sounds are normal. There is no abdominal tenderness.  Musculoskeletal:     Right ankle: He exhibits swelling.     Left ankle: He exhibits swelling.  Neurological:  Unresponsive to sternal rub  Skin: Skin is warm. No rash noted.  Stage II decubiti right heel  Psychiatric:  Unresponsive to sternal rub      Data Reviewed: Basic Metabolic Panel: Recent Labs  Lab 07/24/18 0741 07/24/18 1433  NA 141 145  K 5.5* 4.9  CL 118* 120*  CO2 12* 14*  GLUCOSE 88 76  BUN 94* 98*  CREATININE 7.68* 7.34*  CALCIUM 8.4* 7.6*   Liver Function Tests: Recent Labs  Lab 07/24/18 0741  AST 29  ALT 16  ALKPHOS 58  BILITOT 0.7  PROT 6.1*  ALBUMIN 2.6*   CBC: Recent Labs  Lab 07/24/18 0741  WBC 15.3*  NEUTROABS 13.8*  HGB 9.6*  HCT 31.3*   MCV 97.8  PLT 226    CBG: Recent Labs  Lab 07/24/18 1722 07/24/18 1825 07/24/18 2004  GLUCAP 62* 97 72    Recent Results (from the past 240 hour(s))  Urine Culture     Status: None   Collection Time: 07/24/18  8:49 AM  Result Value Ref Range Status   Specimen Description   Final    URINE, RANDOM Performed at [facility], [address] [place], [place] [address]    Special Requests   Final    NONE Performed at [facility], [address] [place], [place] [address]    Culture   Final    NO GROWTH Performed at [facility], [place] [address], [place], [place] [address]    Report Status 07/25/2018 FINAL  Final  Blood culture (routine x 2)     Status: None (Preliminary result)   Collection Time: 07/24/18  8:49 AM  Result Value Ref Range Status   Specimen Description BLOOD BLOOD LEFT FOREARM  Final   Special Requests   Final    BOTTLES DRAWN AEROBIC AND ANAEROBIC Blood Culture adequate volume   Culture   Final    NO GROWTH 2 DAYS Performed at [facility], [address] [place], [place] [address]    Report  Status PENDING  Incomplete  Blood culture (routine x 2)     Status: None (Preliminary result)   Collection Time: 07/24/18  8:49 AM  Result Value Ref Range Status   Specimen Description BLOOD BLOOD RIGHT HAND  Final   Special Requests   Final    BOTTLES DRAWN AEROBIC AND ANAEROBIC Blood Culture adequate volume   Culture   Final    NO GROWTH 2 DAYS Performed at [facility], [address] [place], [place] [address]    Report Status PENDING  Incomplete  MRSA PCR Screening     Status: None   Collection Time: 07/24/18  5:23 PM  Result Value Ref Range Status   MRSA by PCR NEGATIVE NEGATIVE Final    Comment:        The GeneXpert MRSA Assay (FDA approved for NASAL specimens only), is one component of a comprehensive MRSA colonization surveillance program. It is not intended to diagnose MRSA infection nor to guide  or monitor treatment for MRSA infections. Performed at [facility], [address], [place], [place] [address]      Studies: No results found.  Scheduled Meds: Continuous Infusions:  Assessment/Plan:  1. Severe sepsis with hypotension and acute kidney injury.  Patient also had leukocytosis and hypothermia.  Blood pressure dropped down last night and patient was made comfort care measures.  Patient is hypothermic 2.  acute kidney injury on chronic kidney disease.  3. Hyperkalemia  4. Pneumonia, acute cystitis.  The patient was initially started on Rocephin and Zithromax.  Since the was made comfort care antibiotics were stopped. 5. Type 2 diabetes mellitus with gastroparesis 6. Dementia 7. BPH  Palliative care consultation-patient is made comfort care morphine as needed Follow-up with social worker regarding hospice  Code Status:     Code Status Orders  (From admission, onward)         Start     Ordered   07/24/18 1009  Do not attempt resuscitation (DNR)  Continuous    Question Answer Comment  In the event of cardiac or respiratory ARREST Do not call a "code blue"   In the event of cardiac or respiratory ARREST Do not perform Intubation, CPR, defibrillation or ACLS   In the event of cardiac or respiratory ARREST Use medication by any route, position, wound care, and other measures to relive pain and suffering. May use oxygen, suction and manual treatment of airway obstruction as needed for comfort.   Comments nurse may pronounce      07/24/18 1009        Code Status History    Date Active Date Inactive Code Status Order ID Comments User Context   07/24/2018 0958 07/24/2018 1009 DNR [id number]  Loletha Grayer, MD ED   01/21/2015 2119 01/27/2015 1751 DNR [id number]  Colleen Can, MD Inpatient   01/17/2015 1748 01/21/2015 2119 Full Code [id number]  Dustin Flock, MD Inpatient   01/04/2015 1747 01/09/2015 1443 Full Code [id number]  Theodoro Grist, MD Inpatient    02/13/2014 1329 01/04/2015 1747 Full Code [id number]  Hennie Duos, MD Outpatient   01/13/2014 0704 02/08/2014 1719 Full Code [id number]  Janyth Pupa Inpatient   05/06/2013 1554 05/29/2013 1746 Full Code [id number]  Meda Coffee Inpatient     Family Communication: No family members are available and unable to reach them Disposition Plan: Patient made comfort care measures  Consultants:  Nephrology  Palliative care  Time spent: 25 minutes  Illene Silver Jihaad Bruschi  [employer]

## 2018-07-26 NOTE — Progress Notes (Signed)
Patient resting in bed.  At this time, does not appear in any distress.  Repositioned q2hr for comfort.

## 2018-07-27 ENCOUNTER — Encounter: Payer: Self-pay | Admitting: *Deleted

## 2018-07-27 MED ORDER — ORAL CARE MOUTH RINSE: 15.0000 mL | Freq: Two times a day (BID) | OROMUCOSAL | Status: DC

## 2018-07-27 MED ORDER — CHLORHEXIDINE GLUCONATE 0.12 % MT SOLN
15.0000 mL | Freq: Two times a day (BID) | OROMUCOSAL | Status: DC
  Administered 2018-07-27 – 2018-07-28 (×2): 15 mL via OROMUCOSAL
  Filled 2018-07-27 (×2): qty 15

## 2018-07-27 NOTE — Progress Notes (Signed)
Patient ID: Alan Mcintyre, male   DOB: [birth year], 84 y.o.   MRN: [id number]  [facility] PROGRESS NOTE  Alan Mcintyre YQI:[id number] DOB: 1934-10-10 DOA: 07/24/2018 PCP: Lorelee Market, MD  HPI/Subjective: Patient is resting comfortably not eating according to the nurse  Awaiting for the daughter to come from [place] No family members at bedside  Objective: Vitals:   07/25/18 1948 07/25/18 2100  BP: (!) 104/52   Pulse: 78   Resp: 20   Temp:  (!) 95.2 F (35.1 C)  SpO2: 96%     Filed Weights   07/24/18 0720 07/24/18 1545 07/25/18 0411  Weight: 90.7 kg 77.8 kg 77.5 kg    ROS: Review of Systems  Unable to perform ROS: Acuity of condition   Exam: Physical Exam  HENT:  Nose: No mucosal edema.  Mouth/Throat: No oropharyngeal exudate.  Dry  Eyes: Lids are normal.  Eyes rolled back into his head  Neck: Carotid bruit is not present.  Cardiovascular: Regular rhythm, S1 normal, S2 normal and normal heart sounds.  Respiratory: He has decreased breath sounds in the right lower field and the left lower field. He has rhonchi in the right lower field and the left lower field.  GI: Soft. Bowel sounds are normal. There is no abdominal tenderness.  Musculoskeletal:     Right ankle: He exhibits swelling.     Left ankle: He exhibits swelling.  Neurological:  Unresponsive to sternal rub  Skin: Skin is warm. No rash noted.  Stage II decubiti right heel  Psychiatric:  Unresponsive to sternal rub      Data Reviewed: Basic Metabolic Panel: Recent Labs  Lab 07/24/18 0741 07/24/18 1433  NA 141 145  K 5.5* 4.9  CL 118* 120*  CO2 12* 14*  GLUCOSE 88 76  BUN 94* 98*  CREATININE 7.68* 7.34*  CALCIUM 8.4* 7.6*   Liver Function Tests: Recent Labs  Lab 07/24/18 0741  AST 29  ALT 16  ALKPHOS 58  BILITOT 0.7  PROT 6.1*  ALBUMIN 2.6*   CBC: Recent Labs  Lab 07/24/18 0741  WBC 15.3*  NEUTROABS 13.8*  HGB 9.6*  HCT 31.3*  MCV 97.8  PLT 226     CBG: Recent Labs  Lab 07/24/18 1722 07/24/18 1825 07/24/18 2004  GLUCAP 62* 97 72    Recent Results (from the past 240 hour(s))  Urine Culture     Status: None   Collection Time: 07/24/18  8:49 AM  Result Value Ref Range Status   Specimen Description   Final    URINE, RANDOM Performed at [facility], [address], [place], [place] [address]    Special Requests   Final    NONE Performed at [facility], [address], [place], [place] [address]    Culture   Final    NO GROWTH Performed at [facility], [place] [address] [place], [place] [address]    Report Status 07/25/2018 FINAL  Final  Blood culture (routine x 2)     Status: None (Preliminary result)   Collection Time: 07/24/18  8:49 AM  Result Value Ref Range Status   Specimen Description BLOOD BLOOD LEFT FOREARM  Final   Special Requests   Final    BOTTLES DRAWN AEROBIC AND ANAEROBIC Blood Culture adequate volume   Culture   Final    NO GROWTH 3 DAYS Performed at [facility], [address], [place], [place] [address]    Report Status PENDING  Incomplete  Blood culture (routine x 2)     Status: None (Preliminary result)   Collection Time: 07/24/18  8:49 AM  Result Value Ref Range Status   Specimen Description BLOOD BLOOD RIGHT HAND  Final   Special Requests   Final    BOTTLES DRAWN AEROBIC AND ANAEROBIC Blood Culture adequate volume   Culture   Final    NO GROWTH 3 DAYS Performed at [facility], [address] [place], [place] [address]    Report Status PENDING  Incomplete  MRSA PCR Screening     Status: None   Collection Time: 07/24/18  5:23 PM  Result Value Ref Range Status   MRSA by PCR NEGATIVE NEGATIVE Final    Comment:        The GeneXpert MRSA Assay (FDA approved for NASAL specimens only), is one component of a comprehensive MRSA colonization surveillance program. It is not intended to diagnose MRSA infection nor to guide or monitor  treatment for MRSA infections. Performed at [facility], [address], [place], [place] [address]      Studies: No results found.  Scheduled Meds: Continuous Infusions:  Assessment/Plan:  1. Severe sepsis with hypotension and acute kidney injury.  Patient also had leukocytosis and hypothermia.  Blood pressure dropped down last night and patient was made comfort care measures.  Patient is hypothermic 2.  acute kidney injury on chronic kidney disease.  3. Hyperkalemia  4. Pneumonia, acute cystitis.  The patient was initially started on Rocephin and Zithromax.  Since the was made comfort care antibiotics were stopped. 5. Type 2 diabetes mellitus with gastroparesis 6. Dementia 7. BPH  Palliative care consultation-patient is made comfort care Continue comfort care measures morphine as needed Follow-up with social worker regarding hospice, awaiting for patient's daughter's arrival from [place]. Dr. Candace Cruise  Code Status:     Code Status Orders  (From admission, onward)         Start     Ordered   07/24/18 1009  Do not attempt resuscitation (DNR)  Continuous    Question Answer Comment  In the event of cardiac or respiratory ARREST Do not call a "code blue"   In the event of cardiac or respiratory ARREST Do not perform Intubation, CPR, defibrillation or ACLS   In the event of cardiac or respiratory ARREST Use medication by any route, position, wound care, and other measures to relive pain and suffering. May use oxygen, suction and manual treatment of airway obstruction as needed for comfort.   Comments nurse may pronounce      07/24/18 1009        Code Status History    Date Active Date Inactive Code Status Order ID Comments User Context   07/24/2018 0958 07/24/2018 1009 DNR [id number]  Loletha Grayer, MD ED   01/21/2015 2119 01/27/2015 1751 DNR [id number]  Colleen Can, MD Inpatient   01/17/2015 1748 01/21/2015 2119 Full Code [id number]  Dustin Flock, MD Inpatient    01/04/2015 1747 01/09/2015 1443 Full Code [id number]  Theodoro Grist, MD Inpatient   02/13/2014 1329 01/04/2015 1747 Full Code [id number]  Hennie Duos, MD Outpatient   01/13/2014 0704 02/08/2014 1719 Full Code [id number]  Janyth Pupa Inpatient   05/06/2013 1554 05/29/2013 1746 Full Code [id number]  Meda Coffee Inpatient     Family Communication: No family members are available and unable to reach them Disposition Plan: Patient made comfort care measures  Consultants:  Nephrology  Palliative care  Time spent: 25 minutes  Nicholes Mango  [employer]

## 2018-07-28 MED ORDER — MORPHINE SULFATE (CONCENTRATE) 10 MG/0.5ML PO SOLN: 5.0000 mg | ORAL | 0 refills | Status: AC | PRN

## 2018-07-28 MED ORDER — CHLORHEXIDINE GLUCONATE 0.12 % MT SOLN: 15.0000 mL | Freq: Two times a day (BID) | OROMUCOSAL | 0 refills | Status: AC

## 2018-07-28 MED ORDER — ORAL CARE MOUTH RINSE: 15.0000 mL | Freq: Two times a day (BID) | OROMUCOSAL | 0 refills | Status: AC

## 2018-07-28 MED ORDER — ACETAMINOPHEN 325 MG PO TABS: 650.0000 mg | ORAL_TABLET | Freq: Four times a day (QID) | ORAL | Status: AC | PRN

## 2018-07-28 MED ORDER — ONDANSETRON HCL 4 MG PO TABS: 4.0000 mg | ORAL_TABLET | Freq: Four times a day (QID) | ORAL | 0 refills | Status: AC | PRN

## 2018-07-28 MED ORDER — LORAZEPAM 1 MG PO TABS: 1.0000 mg | ORAL_TABLET | ORAL | 0 refills | Status: AC | PRN
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
## 2018-07-28 NOTE — Progress Notes (Signed)
Please note patient is currently followed by outpatient Palliative at [facility]. CSW Annamaria Boots made aware. [facility] received from [place]. Plan is for discharge back to [facility] today. Patient information faxed to referral. Hospital care team updated. Family not present but is in agreement with discharge plan. Flo Shanks RN, BSN, [facility] [place], hospital liaison [phone]

## 2018-07-28 NOTE — Clinical Social Work Note (Signed)
CSW spoke with patient's daughter Lars Mage [phone] regarding disposition. CSW explained that patient could return to [facility] with hospice services or could go to Hospice home. Daughter states that she would prefer patient to return to [facility] with hospice services. CSW notified MD of her request. CSW will continue to follow for discharge planning.   [place], [place]

## 2018-07-28 NOTE — Progress Notes (Signed)
Patient prepared for transport. Report called to [facility], EMS called for transportation.

## 2018-07-28 NOTE — Clinical Social Work Note (Signed)
Patient is medically ready for discharge today. CSW notified patient's daughter Alan Mcintyre [phone] of discharge back to [facility] today. CSW also notified Claiborne Billings at [facility] of discharge today. Patient will be transported by EMS. RN to call report and call for transport.   [place], [place]

## 2018-07-28 NOTE — Plan of Care (Signed)
  Problem: Pain Managment: Goal: General experience of comfort will improve Outcome: Progressing Note:  Pt rested comfortably, per FACES scale no pain   Problem: Clinical Measurements: Goal: Respiratory complications will improve Outcome: Not Progressing Note:  On room air, but a lot of secretions, suctioned multiple times, and robinul given   Problem: Nutrition: Goal: Adequate nutrition will be maintained Outcome: Not Progressing Note:  NPO

## 2018-07-28 NOTE — Care Management Important Message (Signed)
Patient unable to sign Medicare IM.  No family in room.

## 2018-07-29 LAB — CULTURE, BLOOD (ROUTINE X 2)
Culture: NO GROWTH
Culture: NO GROWTH
Special Requests: ADEQUATE
Special Requests: ADEQUATE

## 2018-08-16 DEATH — deceased

## 2019-03-26 IMAGING — CR DG CHEST 1V PORT
1 series · 1 of 1 positions shown · non-contrast
Comparison: 01/25/2015.

CLINICAL DATA: AMS

EXAM:
PORTABLE CHEST 1 VIEW

[dg chest port 1 view]
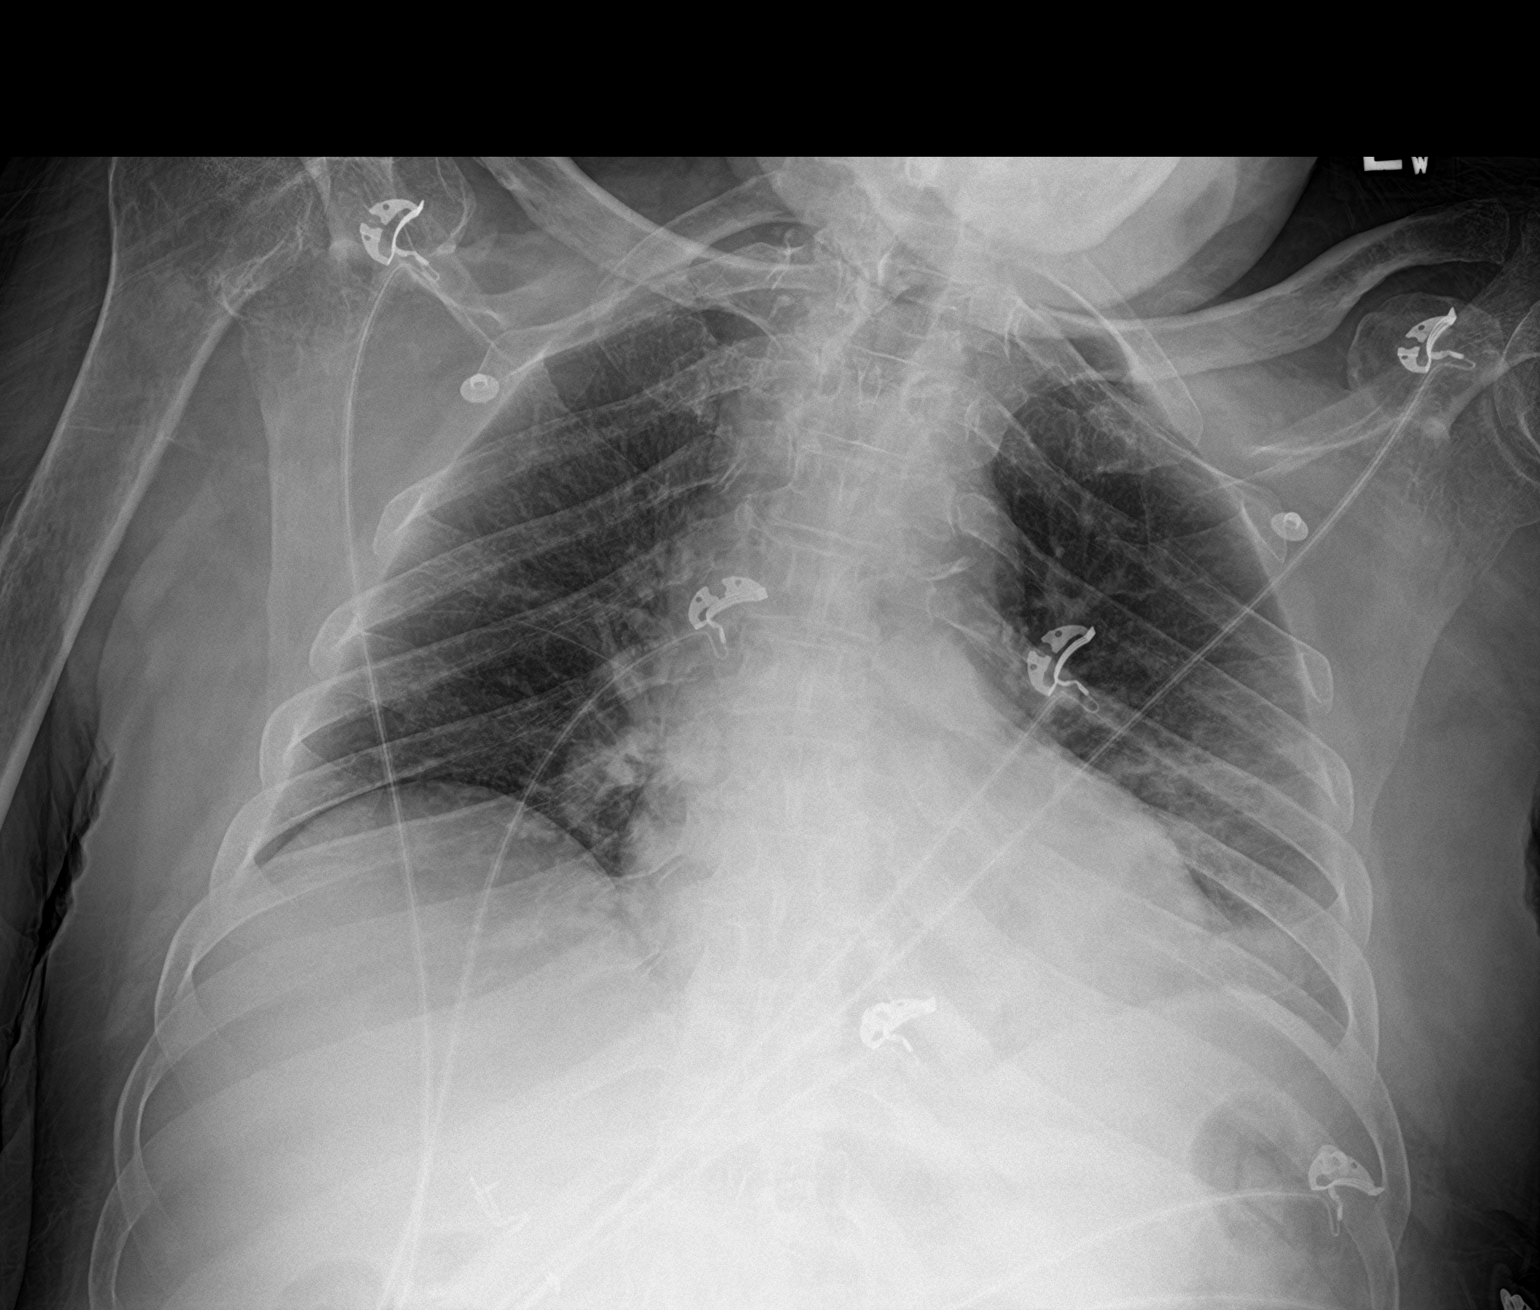

[1 of 1 positions shown; findings below may reference images not displayed]

FINDINGS: Mediastinum hilar structures normal. Cardiomegaly with normal
pulmonary vascularity. Left base infiltrate consistent pneumonia.
Small left pleural effusion.
IMPRESSION: 1.  Left base infiltrate consistent with pneumonia.

2.  Small left pleural effusion.
